# Patient Record
Sex: Female | Born: 1950 | Race: Black or African American | Hispanic: No | Marital: Married | Smoking: Never smoker
Health system: Southern US, Community
[De-identification: ages and names within clinical notes are randomized; demographics above are authoritative.]

## PROBLEM LIST (undated history)

## (undated) DIAGNOSIS — F32A Depression, unspecified: Secondary | ICD-10-CM

## (undated) DIAGNOSIS — I071 Rheumatic tricuspid insufficiency: Secondary | ICD-10-CM

## (undated) DIAGNOSIS — Z789 Other specified health status: Secondary | ICD-10-CM

## (undated) DIAGNOSIS — I10 Essential (primary) hypertension: Secondary | ICD-10-CM

## (undated) DIAGNOSIS — D649 Anemia, unspecified: Secondary | ICD-10-CM

## (undated) DIAGNOSIS — I499 Cardiac arrhythmia, unspecified: Secondary | ICD-10-CM

## (undated) DIAGNOSIS — G4730 Sleep apnea, unspecified: Secondary | ICD-10-CM

## (undated) DIAGNOSIS — M1990 Unspecified osteoarthritis, unspecified site: Secondary | ICD-10-CM

## (undated) DIAGNOSIS — K219 Gastro-esophageal reflux disease without esophagitis: Secondary | ICD-10-CM

## (undated) DIAGNOSIS — I341 Nonrheumatic mitral (valve) prolapse: Secondary | ICD-10-CM

## (undated) DIAGNOSIS — K760 Fatty (change of) liver, not elsewhere classified: Secondary | ICD-10-CM

## (undated) DIAGNOSIS — IMO0001 Reserved for inherently not codable concepts without codable children: Secondary | ICD-10-CM

## (undated) DIAGNOSIS — E119 Type 2 diabetes mellitus without complications: Secondary | ICD-10-CM

## (undated) DIAGNOSIS — F329 Major depressive disorder, single episode, unspecified: Secondary | ICD-10-CM

## (undated) DIAGNOSIS — I493 Ventricular premature depolarization: Secondary | ICD-10-CM

## (undated) DIAGNOSIS — K589 Irritable bowel syndrome without diarrhea: Secondary | ICD-10-CM

## (undated) DIAGNOSIS — R609 Edema, unspecified: Secondary | ICD-10-CM

## (undated) HISTORY — DX: Irritable bowel syndrome, unspecified: K58.9

## (undated) HISTORY — DX: Major depressive disorder, single episode, unspecified: F32.9

## (undated) HISTORY — DX: Ventricular premature depolarization: I49.3

## (undated) HISTORY — DX: Cardiac arrhythmia, unspecified: I49.9

## (undated) HISTORY — PX: TONSILLECTOMY: SUR1361

## (undated) HISTORY — DX: Gastro-esophageal reflux disease without esophagitis: K21.9

## (undated) HISTORY — DX: Other specified health status: Z78.9

## (undated) HISTORY — DX: Reserved for inherently not codable concepts without codable children: IMO0001

## (undated) HISTORY — DX: Nonrheumatic mitral (valve) prolapse: I34.1

## (undated) HISTORY — DX: Depression, unspecified: F32.A

## (undated) HISTORY — DX: Edema, unspecified: R60.9

## (undated) HISTORY — DX: Essential (primary) hypertension: I10

---

## 1983-10-18 HISTORY — PX: EXCISION / BIOPSY BREAST / NIPPLE / DUCT: SUR469

## 1983-10-18 HISTORY — PX: ABDOMINAL HYSTERECTOMY: SHX81

## 1998-12-28 ENCOUNTER — Emergency Department (HOSPITAL_COMMUNITY): Admission: EM | Admit: 1998-12-28 | Discharge: 1998-12-28 | Payer: Self-pay | Admitting: Emergency Medicine

## 1998-12-28 ENCOUNTER — Encounter: Payer: Self-pay | Admitting: Emergency Medicine

## 2000-10-17 HISTORY — PX: BILATERAL SALPINGOOPHORECTOMY: SHX1223

## 2003-02-07 ENCOUNTER — Encounter: Payer: Self-pay | Admitting: Internal Medicine

## 2003-02-08 ENCOUNTER — Inpatient Hospital Stay (HOSPITAL_COMMUNITY): Admission: EM | Admit: 2003-02-08 | Discharge: 2003-02-08 | Payer: Self-pay | Admitting: Internal Medicine

## 2003-02-14 ENCOUNTER — Encounter (HOSPITAL_COMMUNITY): Admission: RE | Admit: 2003-02-14 | Discharge: 2003-03-16 | Payer: Self-pay | Admitting: Cardiology

## 2004-12-21 ENCOUNTER — Ambulatory Visit: Payer: Self-pay | Admitting: General Surgery

## 2006-01-17 ENCOUNTER — Ambulatory Visit: Payer: Self-pay | Admitting: General Surgery

## 2006-06-16 ENCOUNTER — Ambulatory Visit: Payer: Self-pay

## 2006-10-17 DIAGNOSIS — I499 Cardiac arrhythmia, unspecified: Secondary | ICD-10-CM

## 2006-10-17 HISTORY — DX: Cardiac arrhythmia, unspecified: I49.9

## 2007-01-23 ENCOUNTER — Ambulatory Visit: Payer: Self-pay | Admitting: General Surgery

## 2007-03-11 ENCOUNTER — Inpatient Hospital Stay: Payer: Self-pay | Admitting: Internal Medicine

## 2007-03-11 ENCOUNTER — Other Ambulatory Visit: Payer: Self-pay

## 2007-03-16 ENCOUNTER — Ambulatory Visit: Payer: Self-pay | Admitting: Internal Medicine

## 2007-03-28 ENCOUNTER — Ambulatory Visit: Payer: Self-pay | Admitting: Internal Medicine

## 2007-04-09 ENCOUNTER — Ambulatory Visit: Payer: Self-pay | Admitting: Cardiovascular Disease

## 2007-05-23 ENCOUNTER — Ambulatory Visit: Payer: Self-pay | Admitting: Unknown Physician Specialty

## 2008-02-19 ENCOUNTER — Ambulatory Visit: Payer: Self-pay | Admitting: General Surgery

## 2009-03-03 ENCOUNTER — Ambulatory Visit: Payer: Self-pay | Admitting: General Surgery

## 2010-03-05 ENCOUNTER — Ambulatory Visit: Payer: Self-pay | Admitting: General Surgery

## 2010-10-17 HISTORY — PX: COLONOSCOPY: SHX174

## 2011-03-07 ENCOUNTER — Ambulatory Visit: Payer: Self-pay | Admitting: General Surgery

## 2011-09-28 LAB — HM COLONOSCOPY: HM Colonoscopy: NORMAL

## 2012-02-24 DIAGNOSIS — I493 Ventricular premature depolarization: Secondary | ICD-10-CM

## 2012-02-24 DIAGNOSIS — I10 Essential (primary) hypertension: Secondary | ICD-10-CM | POA: Insufficient documentation

## 2012-02-24 HISTORY — DX: Ventricular premature depolarization: I49.3

## 2012-03-27 ENCOUNTER — Ambulatory Visit: Payer: Self-pay | Admitting: General Surgery

## 2012-03-28 LAB — HM MAMMOGRAPHY: HM Mammogram: NORMAL

## 2013-01-25 ENCOUNTER — Ambulatory Visit (INDEPENDENT_AMBULATORY_CARE_PROVIDER_SITE_OTHER): Payer: BC Managed Care – PPO | Admitting: Internal Medicine

## 2013-01-25 ENCOUNTER — Encounter: Payer: Self-pay | Admitting: Internal Medicine

## 2013-01-25 VITALS — BP 124/80 | HR 68 | Temp 98.2°F | Ht 65.5 in | Wt 190.0 lb

## 2013-01-25 DIAGNOSIS — Z0000 Encounter for general adult medical examination without abnormal findings: Secondary | ICD-10-CM

## 2013-01-25 DIAGNOSIS — R5381 Other malaise: Secondary | ICD-10-CM

## 2013-01-25 DIAGNOSIS — Z13220 Encounter for screening for lipoid disorders: Secondary | ICD-10-CM

## 2013-01-25 DIAGNOSIS — R5383 Other fatigue: Secondary | ICD-10-CM

## 2013-01-25 DIAGNOSIS — E669 Obesity, unspecified: Secondary | ICD-10-CM

## 2013-01-25 DIAGNOSIS — Z8742 Personal history of other diseases of the female genital tract: Secondary | ICD-10-CM

## 2013-01-25 DIAGNOSIS — R221 Localized swelling, mass and lump, neck: Secondary | ICD-10-CM

## 2013-01-25 DIAGNOSIS — R609 Edema, unspecified: Secondary | ICD-10-CM

## 2013-01-25 DIAGNOSIS — K625 Hemorrhage of anus and rectum: Secondary | ICD-10-CM

## 2013-01-25 DIAGNOSIS — R220 Localized swelling, mass and lump, head: Secondary | ICD-10-CM

## 2013-01-25 LAB — POCT URINALYSIS DIPSTICK
Bilirubin, UA: NEGATIVE
Blood, UA: NEGATIVE
Glucose, UA: NEGATIVE
Ketones, UA: NEGATIVE
Leukocytes, UA: NEGATIVE
Nitrite, UA: NEGATIVE
Protein, UA: NEGATIVE
Spec Grav, UA: 1.01
Urobilinogen, UA: 0.2
pH, UA: 8

## 2013-01-25 NOTE — Patient Instructions (Addendum)
Your pelvic exam was normal  You have an internal hemorrhoid that probably bled when you passed a hard stool  Try taking a stool softener (colace 100 mg daily) to avoid hard stools/  Continue high fiber diet and at least 3 16 ounce servings on noncaffeinated fluids  Return for fasting labs.   One month follow up

## 2013-01-25 NOTE — Progress Notes (Signed)
Patient ID: Kari Lyons, female   DOB: [birth year], 62 y.o.   MRN: [id number]    Patient Active Problem List  Diagnosis  . Rectal bleeding  . Right facial swelling  . History of vaginal discharge    Subjective:  CC:   Chief Complaint  Patient presents with  . Establish Care    HPI:   Kari Lyons is a 62 y.o. female who presents as a new patient to establish primary care with the chief complaint of Multiple complaints. She is formerly patient of Y[facility] Dill and Dr. Corliss Parish .   Family history of cancer. Her Sister had ovarian ca in her 81s.  Paternal grandmother had breast cancer. Maternal grandmother had colon cancer. Brother had multiple myeloma. Has BRCA and colon CA in family.  Her other surviving sister has been genetically tested for BRCA1 and 2 and was negative. She has not been tested. Her daughter has not been tested. After her sister was diagnosed with ovarian cancer she underwent a hysterectomy in 1985 followed by a bilateral salpingo-oophorectomy oophorectomy in 2002.  these were done at [facility] by Dr. Ether Griffins .  History of bloody nipple discharge. She underwent breast biopsy on the right by Dr. Evette Cristal which was normal. Mammograms and annual breast exams are done by Dr. Evette Cristal.  Rectal pain. Patient has a history of a normal colonoscopy in December of 2004 by Dr. Mechele Collin per patient. She believes she was told she had hemorrhoids. She has been having rectal pain with bowel movements and noticed bright red blood per rectum recently after wiping. No blood mixed in the stools.   Lymph node enlargement. Patient states that she has had facial swelling on the right and enlargement and pain in her thymus and spleen areas. She cannot characterize his pain and actually states that the pain and enlargement was diagnosed through reflexology that she does on her own. She has no history of night sweats, generalized weakness,, focal muscle weakness, unintentional weight loss or shortness of  breath.  She does have a history of a root canal on the right and is overdue for dental evaluation.    Past Medical History  Diagnosis Date  . IBS (irritable bowel syndrome)   . GERD (gastroesophageal reflux disease)   . Depression   . Hypertension     Past Surgical History  Procedure Laterality Date  . Tonsilectomy, adenoidectomy, bilateral myringotomy and tubes    . Breast biopsy    . Bilateral salpingoophorectomy  2002    due to fh of ovarian ca  . Abdominal hysterectomy  1985    Family History  Problem Relation Age of Onset  . Hypertension Mother   . Arthritis Mother   . Parkinson's disease Father   . Hypertension Sister   . Heart disease Daughter   . Cancer Sister 45    ovarian ca  . Cancer Brother     multiple myeloma  . Diabetes Brother   . Cancer Maternal Grandmother     colon ca  . Cancer Paternal Grandmother     breast ca    History   Social History  . Marital Status: Married    Spouse Name: N/A    Number of Children: N/A  . Years of Education: N/A   Occupational History  . Not on file.   Social History Main Topics  . Smoking status: Never Smoker   . Smokeless tobacco: Never Used  . Alcohol Use: No  . Drug Use: No  .  Sexually Active: Not on file   Other Topics Concern  . Not on file   Social History Narrative  . No narrative on file   Allergies  Allergen Reactions  . Aloe Vera Rash    Review of Systems:   Patient denies headache, fevers, malaise, unintentional weight loss, skin rash, eye pain, sinus congestion and sinus pain, sore throat, dysphagia,  hemoptysis , cough, dyspnea, wheezing, chest pain, palpitations, orthopnea, edema, abdominal pain, nausea, melena, diarrhea, constipation, flank pain, dysuria, hematuria, urinary  Frequency, nocturia, numbness, tingling, seizures,  Focal weakness, Loss of consciousness,  Tremor, insomnia, depression, anxiety, and suicidal ideation.          Objective:  BP 124/80  Pulse 68   Temp(Src) 98.2 F (36.8 C) (Oral)  Ht 5' 5.5" (1.664 m)  Wt 190 lb (86.183 kg)  BMI 31.13 kg/m2  SpO2 97%  General Appearance:    Alert, cooperative, no distress, appears stated age  Head:    Normocephalic, without obvious abnormality, atraumatic  Eyes:    PERRL, conjunctiva/corneas clear, EOM's intact, fundi    benign, both eyes  Ears:    Normal TM's and external ear canals, both ears  Nose:   Nares normal, septum midline, mucosa normal, no drainage    or sinus tenderness  Throat:   Lips, mucosa, and tongue normal; teeth and gums normal  Neck:   Supple, symmetrical, trachea midline, no cervical adenopathy or parotid enlargement    thyroid:  no enlargement/tenderness/nodules; no carotid   bruit or JVD  Back:     Symmetric, no curvature, ROM normal, no CVA tenderness  Lungs:     Clear to auscultation bilaterally, respirations unlabored  Chest Wall:    No tenderness or deformity no masses appreciated.    Heart:    Regular rate and rhythm, S1 and S2 normal, no murmur, rub   or gallop  Rectal Exam:    No tenderness, masses, or bleeding. Internal hemorrhoids noted.   Abdomen:     Soft, non-tender, bowel sounds active all four quadrants,    no masses, no organomegaly  Extremities:   Extremities normal, atraumatic, no cyanosis or edema  Pulses:   2+ and symmetric all extremities  Skin:   Skin color, texture, turgor normal, no rashes or lesions  Lymph nodes:   Cervical, supraclavicular, and axillary nodes normal  Neurologic:   CNII-XII intact, normal strength, sensation and reflexes    throughout   Assessment and Plan:  Rectal bleeding Today's rectal exam suggested that she is bleeding from internal hemorrhoids. They were not actively bleeding today. Recommended use of stool softener since fiber supplements cause for her abdominal pain and distention which she cannot tolerate. She is up-to-date on colonoscopy..  Right facial swelling Patient description and history of facial swelling  does not support current physical exam. She has no sense of parotid enlargement, tenderness, or lymphadenopathy on the right cervical side. It exam is normal. Reassurance given.   Updated Medication List Outpatient Encounter Prescriptions as of 01/25/2013  Medication Sig Dispense Refill  . Bearberry, Uva-Ursi, (UVA URSI PO) Take 500 mg by mouth 3 (three) times daily.      Marland Kitchen Bioflavonoid Products (ESTER C PO) Take 500 mg by mouth.      . Cholecalciferol (VITAMIN D3) 2000 UNITS TABS Take 2,000 Units by mouth daily.      . Chromium 200 MCG CAPS Take by mouth.      Marland Kitchen HAWTHORN BERRIES PO Take 510 mg by mouth 3 (  three) times daily.      . magnesium gluconate (MAGONATE) 500 MG tablet Take 500 mg by mouth daily.      . Misc Natural Products (DANDELION ROOT) 520 MG CAPS Take by mouth.      . Potassium Gluconate 595 MG CAPS Take 595 mg by mouth daily.      Marland Kitchen RUTIN PO Take 450 mg by mouth 2 (two) times daily.      Marland Kitchen UNABLE TO FIND Take 480 mg by mouth daily. Marshmallow Root      . UNABLE TO FIND 400 mg. [employer]      . UNABLE TO FIND Fennel Seed      . Valerian Root EXTR by Does not apply route 3 (three) times daily.       No facility-administered encounter medications on file as of 01/25/2013.     Orders Placed This Encounter  Procedures  . HM MAMMOGRAPHY  . Lipid panel  . CBC with Differential  . Comprehensive metabolic panel  . TSH  . POCT Urinalysis Dipstick  . HM COLONOSCOPY    Return in about 4 weeks (around 02/22/2013).

## 2013-01-26 DIAGNOSIS — R220 Localized swelling, mass and lump, head: Secondary | ICD-10-CM | POA: Insufficient documentation

## 2013-01-26 DIAGNOSIS — K625 Hemorrhage of anus and rectum: Secondary | ICD-10-CM | POA: Insufficient documentation

## 2013-01-26 DIAGNOSIS — Z8742 Personal history of other diseases of the female genital tract: Secondary | ICD-10-CM | POA: Insufficient documentation

## 2013-01-26 NOTE — Assessment & Plan Note (Signed)
Patient description and history of facial swelling does not support current physical exam. She has no sense of parotid enlargement, tenderness, or lymphadenopathy on the right cervical side. It exam is normal. Reassurance given.

## 2013-01-26 NOTE — Assessment & Plan Note (Signed)
Today's rectal exam suggested that she is bleeding from internal hemorrhoids. They were not actively bleeding today. Recommended use of stool softener since fiber supplements cause for her abdominal pain and distention which she cannot tolerate. She is up-to-date on colonoscopy.Marland Kitchen

## 2013-02-11 ENCOUNTER — Ambulatory Visit (INDEPENDENT_AMBULATORY_CARE_PROVIDER_SITE_OTHER): Payer: BC Managed Care – PPO | Admitting: General Surgery

## 2013-02-11 ENCOUNTER — Encounter: Payer: Self-pay | Admitting: General Surgery

## 2013-02-11 VITALS — BP 160/78 | HR 68 | Resp 14 | Ht 64.75 in | Wt 191.0 lb

## 2013-02-11 DIAGNOSIS — N6459 Other signs and symptoms in breast: Secondary | ICD-10-CM

## 2013-02-11 DIAGNOSIS — D242 Benign neoplasm of left breast: Secondary | ICD-10-CM | POA: Insufficient documentation

## 2013-02-11 NOTE — Progress Notes (Signed)
Patient ID: Kari Lyons, female   DOB: 1951-02-11, 62 y.o.   MRN: [id number]  Chief Complaint  Patient presents with  . Other    Left breast nipple bleeding    HPI Kari Lyons is a 62 y.o. female who presents for an evaluation of left breast nipple bleeding. The patient states she noticed discharge on her night gown 3-4 days ago but just thought that she had spilled tea or something down the front of her gown. Last night she noticed the blood again. She then checked her breast by squeezing and realized it was blood coming from the left nipple. She states the blood is dark. She has a history of right breast nipple discharge in the past with bright red blood. She had exploratory surgery that was pre-cancerous in 1985. She checks her breast occasionally and gets yearly mammograms. She has a family history of breast cancer on her paternal side. She is due for her next mammogram in June 2014.   HPI  Past Medical History  Diagnosis Date  . IBS (irritable bowel syndrome)   . GERD (gastroesophageal reflux disease)   . Depression   . Hypertension     Past Surgical History  Procedure Laterality Date  . Tonsilectomy, adenoidectomy, bilateral myringotomy and tubes    . Breast biopsy    . Bilateral salpingoophorectomy  2002    due to fh of ovarian ca  . Abdominal hysterectomy  1985    Family History  Problem Relation Age of Onset  . Hypertension Mother   . Arthritis Mother   . Parkinson's disease Father   . Hypertension Sister   . Heart disease Daughter   . Cancer Sister 17    ovarian ca  . Cancer Brother     multiple myeloma  . Diabetes Brother   . Cancer Maternal Grandmother     colon ca  . Cancer Paternal Grandmother     breast ca    Social History History  Substance Use Topics  . Smoking status: Never Smoker   . Smokeless tobacco: Never Used  . Alcohol Use: No    Allergies  Allergen Reactions  . Aloe Vera Rash    Current Outpatient Prescriptions  Medication  Sig Dispense Refill  . Bearberry, Uva-Ursi, (UVA URSI PO) Take 500 mg by mouth 3 (three) times daily.      . Cholecalciferol (VITAMIN D3) 2000 UNITS TABS Take 2,000 Units by mouth daily.      . Chromium 200 MCG TABS Take 1 tablet by mouth 2 (two) times daily.      Marland Kitchen CINNAMON PO Take 1,000 tablets by mouth 2 (two) times daily.      . Fenugreek 610 MG CAPS Take 1 capsule by mouth daily.      . Ginger, Zingiber officinalis, (GINGER PO) 1 teaspoon by mouth daily.      Marland Kitchen HAWTHORN BERRIES PO Take 150 mg by mouth 3 (three) times daily.       Marland Kitchen OVER THE COUNTER MEDICATION Take 1 capsule by mouth daily. THYROID COMPLEX      . OVER THE COUNTER MEDICATION Take 2 capsules by mouth daily. CARDIO ADVANCED      . OVER THE COUNTER MEDICATION Take 2 capsules by mouth at bedtime. Valarian Root      . OVER THE COUNTER MEDICATION Fennel 1 teaspoon by mouth daily.      . Potassium 99 MG TABS Take 1 tablet by mouth daily.  No current facility-administered medications for this visit.    Review of Systems Review of Systems  Constitutional: Negative.   Respiratory: Negative.   Cardiovascular: Negative.     Blood pressure 160/78, pulse 68, resp. rate 14, height 5' 4.75" (1.645 m), weight 191 lb (86.637 kg).  Physical Exam Physical Exam  Constitutional: She appears well-developed and well-nourished.  Neck: Trachea normal. No mass and no thyromegaly present.  Pulmonary/Chest: Right breast exhibits no inverted nipple, no mass, no nipple discharge, no skin change and no tenderness. Left breast exhibits nipple discharge. Left breast exhibits no inverted nipple, no mass, no skin change and no tenderness.  Left nipple discharge blood tinged. Seems to be coming from upper outer quadrant subareolar tissue.  Lymphadenopathy:    She has no cervical adenopathy.    She has no axillary adenopathy.    Data Reviewed None  Assessment    Left nipple discharge     Plan    Patient to proceed with bilateral  diagnostic mammogram. This patient is to return to the office after mammogram for follow up and office ultrasound.  Patient has been scheduled for her mammogram on 02-12-13 at 8 am. She is aware of date, time, and instructions.        SANKAR,SEEPLAPUTHUR G 02/11/2013, 7:41 PM

## 2013-02-11 NOTE — Patient Instructions (Addendum)
Patient to proceed with bilateral diagnostic mammogram. This patient is to return to the office after mammogram for follow up and office ultrasound.  Patient has been scheduled for her mammogram on 02-12-13 at 8 am. She is aware of date, time, and instructions.

## 2013-02-12 ENCOUNTER — Ambulatory Visit: Payer: Self-pay | Admitting: General Surgery

## 2013-02-19 ENCOUNTER — Ambulatory Visit (INDEPENDENT_AMBULATORY_CARE_PROVIDER_SITE_OTHER): Payer: BC Managed Care – PPO | Admitting: General Surgery

## 2013-02-19 ENCOUNTER — Other Ambulatory Visit: Payer: Self-pay

## 2013-02-19 ENCOUNTER — Encounter: Payer: Self-pay | Admitting: General Surgery

## 2013-02-19 VITALS — BP 140/87 | HR 74 | Resp 14 | Ht 64.75 in | Wt 187.0 lb

## 2013-02-19 DIAGNOSIS — N6459 Other signs and symptoms in breast: Secondary | ICD-10-CM

## 2013-02-19 DIAGNOSIS — N63 Unspecified lump in unspecified breast: Secondary | ICD-10-CM

## 2013-02-19 NOTE — Patient Instructions (Addendum)
Continue self breast exams. Call office for any new breast issues or concerns.  Plan left breast excision of nodule at [facility]. This has been scheduled for 02-26-13.

## 2013-02-19 NOTE — Progress Notes (Signed)
Patient ID: Kari Lyons, female   DOB: February 04, 1951, 62 y.o.   MRN: [id number]  Chief Complaint  Patient presents with  . Follow-up    mammo    HPI Kari Lyons is a 62 y.o. female here today for her follow up mammogram done at [facility]. She is here today for planned ultrasound.  Mammogram was normal. Patient complains of left breast pain since mammogram. She was previously here on 02/11/13 for left breast nipple discharge-bloody. The patient states the discharge is still present but hasn't gotten worse nor better since last visit.  HPI  Past Medical History  Diagnosis Date  . IBS (irritable bowel syndrome)   . GERD (gastroesophageal reflux disease)   . Depression   . Hypertension   . Edema   . Arrhythmia 2008    Past Surgical History  Procedure Laterality Date  . Tonsilectomy, adenoidectomy, bilateral myringotomy and tubes    . Breast biopsy    . Bilateral salpingoophorectomy  2002    due to fh of ovarian ca  . Abdominal hysterectomy  1985  . Colonoscopy  2012    Family History  Problem Relation Age of Onset  . Hypertension Mother   . Arthritis Mother   . Parkinson's disease Father   . Hypertension Sister   . Heart disease Daughter   . Cancer Sister 35    ovarian ca  . Cancer Brother     multiple myeloma  . Diabetes Brother   . Cancer Maternal Grandmother     colon ca  . Cancer Paternal Grandmother     breast ca    Social History History  Substance Use Topics  . Smoking status: Never Smoker   . Smokeless tobacco: Never Used  . Alcohol Use: No    Allergies  Allergen Reactions  . Aloe Rash  . Aloe Vera Rash    Current Outpatient Prescriptions  Medication Sig Dispense Refill  . Bearberry, Uva-Ursi, (UVA URSI PO) Take 500 mg by mouth 3 (three) times daily.      . Cholecalciferol (VITAMIN D3) 2000 UNITS TABS Take 2,000 Units by mouth daily.      . Chromium 200 MCG TABS Take 1 tablet by mouth 2 (two) times daily.      Marland Kitchen CINNAMON PO Take 1,000 tablets by  mouth 2 (two) times daily.      . Fenugreek 610 MG CAPS Take 1 capsule by mouth daily.      . Ginger, Zingiber officinalis, (GINGER PO) 1 teaspoon by mouth daily.      Marland Kitchen HAWTHORN BERRIES PO Take 150 mg by mouth 3 (three) times daily.       Marland Kitchen OVER THE COUNTER MEDICATION Take 1 capsule by mouth daily. THYROID COMPLEX      . OVER THE COUNTER MEDICATION Take 2 capsules by mouth daily. CARDIO ADVANCED      . OVER THE COUNTER MEDICATION Take 2 capsules by mouth at bedtime. Valarian Root      . OVER THE COUNTER MEDICATION Fennel 1 teaspoon by mouth daily.      . Potassium 99 MG TABS Take 1 tablet by mouth daily.       No current facility-administered medications for this visit.    Review of Systems Review of Systems  Constitutional: Negative.   Respiratory: Negative.   Cardiovascular: Negative.     Blood pressure 140/87, pulse 74, resp. rate 14, height 5' 4.75" (1.645 m), weight 187 lb (84.823 kg).  Physical Exam Physical Exam  Constitutional: She is oriented to person, place, and time. She appears well-developed and well-nourished.  Neck: No mass and no thyromegaly present.  Cardiovascular: Normal rate, regular rhythm and normal heart sounds.   Pulmonary/Chest: Effort normal and breath sounds normal. Left breast exhibits nipple discharge. Left breast exhibits no inverted nipple, no mass, no skin change and no tenderness.  3 o'clock at areolar margin a 6 mm irregular shaped nodule with minimal shading noted on US  Lymphadenopathy:    She has no cervical adenopathy.    She has no axillary adenopathy.  Neurological: She is alert and oriented to person, place, and time.  Skin: Skin is warm and dry.    Data Reviewed Patient her today for planned ultrasound.  Mammogram was normal.  Assessment    [place] of left breast areolar region shows a 6 mm irregular hypoechoic area, mild shadowing. Located 3 o'cl near areolar margin. No large ducts seen.    Plan    excision of left breast mass and  areolar duct upper outer quadrant    Plan left breast excision of nodule at [facility]. This has been scheduled for 02-26-13.   Please note: patient does not take blood products.   Mills Mitton G 02/20/2013, 5:25 AM

## 2013-02-20 ENCOUNTER — Encounter: Payer: Self-pay | Admitting: General Surgery

## 2013-02-20 ENCOUNTER — Other Ambulatory Visit: Payer: Self-pay | Admitting: General Surgery

## 2013-02-20 DIAGNOSIS — N63 Unspecified lump in unspecified breast: Secondary | ICD-10-CM

## 2013-02-20 DIAGNOSIS — D242 Benign neoplasm of left breast: Secondary | ICD-10-CM | POA: Insufficient documentation

## 2013-02-20 DIAGNOSIS — N6452 Nipple discharge: Secondary | ICD-10-CM

## 2013-02-22 ENCOUNTER — Ambulatory Visit: Payer: Self-pay | Admitting: General Surgery

## 2013-02-22 ENCOUNTER — Other Ambulatory Visit (INDEPENDENT_AMBULATORY_CARE_PROVIDER_SITE_OTHER): Payer: BC Managed Care – PPO

## 2013-02-22 ENCOUNTER — Telehealth: Payer: Self-pay | Admitting: Internal Medicine

## 2013-02-22 ENCOUNTER — Telehealth: Payer: Self-pay | Admitting: *Deleted

## 2013-02-22 DIAGNOSIS — Z13220 Encounter for screening for lipoid disorders: Secondary | ICD-10-CM

## 2013-02-22 DIAGNOSIS — E669 Obesity, unspecified: Secondary | ICD-10-CM

## 2013-02-22 DIAGNOSIS — R5383 Other fatigue: Secondary | ICD-10-CM

## 2013-02-22 DIAGNOSIS — R5381 Other malaise: Secondary | ICD-10-CM

## 2013-02-22 LAB — COMPREHENSIVE METABOLIC PANEL
ALT: 32 U/L (ref 0–35)
AST: 34 U/L (ref 0–37)
Albumin: 4.2 g/dL (ref 3.5–5.2)
Alkaline Phosphatase: 56 U/L (ref 39–117)
BUN: 6 mg/dL (ref 6–23)
CO2: 27 mEq/L (ref 19–32)
Calcium: 9.3 mg/dL (ref 8.4–10.5)
Chloride: 105 mEq/L (ref 96–112)
Creatinine, Ser: 0.8 mg/dL (ref 0.4–1.2)
GFR: 88.13 mL/min (ref >60.00)
Glucose, Bld: 92 mg/dL (ref 70–99)
Potassium: 4.1 mEq/L (ref 3.5–5.1)
Sodium: 140 mEq/L (ref 135–145)
Total Bilirubin: 0.9 mg/dL (ref 0.3–1.2)
Total Protein: 6.8 g/dL (ref 6.0–8.3)

## 2013-02-22 LAB — CBC WITH DIFFERENTIAL/PLATELET
Basophils Absolute: 0 10*3/uL (ref 0.0–0.1)
Basophils Relative: 0.1 % (ref 0.0–3.0)
Eosinophils Absolute: 0.1 10*3/uL (ref 0.0–0.7)
Eosinophils Relative: 1.8 % (ref 0.0–5.0)
HCT: 38.2 % (ref 36.0–46.0)
Hemoglobin: 12.8 g/dL (ref 12.0–15.0)
Lymphocytes Relative: 55.9 % — ABNORMAL HIGH (ref 12.0–46.0)
Lymphs Abs: 2.1 10*3/uL (ref 0.7–4.0)
MCHC: 33.6 g/dL (ref 30.0–36.0)
MCV: 86.6 fl (ref 78.0–100.0)
Monocytes Absolute: 0.3 10*3/uL (ref 0.1–1.0)
Monocytes Relative: 8.1 % (ref 3.0–12.0)
Neutro Abs: 1.3 10*3/uL — ABNORMAL LOW (ref 1.4–7.7)
Neutrophils Relative %: 34.1 % — ABNORMAL LOW (ref 43.0–77.0)
Platelets: 188 10*3/uL (ref 150.0–400.0)
RBC: 4.42 Mil/uL (ref 3.87–5.11)
RDW: 13.9 % (ref 11.5–14.6)
WBC: 3.7 10*3/uL — ABNORMAL LOW (ref 4.5–10.5)

## 2013-02-22 LAB — LIPID PANEL
Cholesterol: 105 mg/dL (ref 0–200)
HDL: 39.5 mg/dL (ref >39.00)
LDL Cholesterol: 54 mg/dL (ref 0–99)
Total CHOL/HDL Ratio: 3
Triglycerides: 58 mg/dL (ref 0.0–149.0)
VLDL: 11.6 mg/dL (ref 0.0–40.0)

## 2013-02-22 LAB — TSH: TSH: 1.09 u[IU]/mL (ref 0.35–5.50)

## 2013-02-22 NOTE — Telephone Encounter (Signed)
Will mail labs to patient to address

## 2013-02-22 NOTE — Telephone Encounter (Signed)
Pt states she is returning a call  

## 2013-02-22 NOTE — Telephone Encounter (Signed)
Pt came in for labs today and said if she can get today's 05.09.2014 labs mailed to her

## 2013-02-26 ENCOUNTER — Ambulatory Visit: Payer: Self-pay | Admitting: General Surgery

## 2013-02-26 DIAGNOSIS — N6459 Other signs and symptoms in breast: Secondary | ICD-10-CM

## 2013-02-26 HISTORY — PX: EXCISION / BIOPSY BREAST / NIPPLE / DUCT: SUR469

## 2013-02-26 HISTORY — PX: BREAST SURGERY: SHX581

## 2013-02-27 ENCOUNTER — Encounter: Payer: Self-pay | Admitting: General Surgery

## 2013-02-28 LAB — PATHOLOGY REPORT

## 2013-03-05 ENCOUNTER — Ambulatory Visit (INDEPENDENT_AMBULATORY_CARE_PROVIDER_SITE_OTHER): Payer: BC Managed Care – PPO | Admitting: Internal Medicine

## 2013-03-05 ENCOUNTER — Encounter: Payer: Self-pay | Admitting: Internal Medicine

## 2013-03-05 ENCOUNTER — Ambulatory Visit (INDEPENDENT_AMBULATORY_CARE_PROVIDER_SITE_OTHER): Payer: BC Managed Care – PPO | Admitting: General Surgery

## 2013-03-05 ENCOUNTER — Encounter: Payer: Self-pay | Admitting: General Surgery

## 2013-03-05 VITALS — BP 124/70 | HR 72 | Resp 14 | Ht 64.0 in | Wt 183.0 lb

## 2013-03-05 VITALS — BP 118/78 | HR 77 | Temp 98.4°F | Resp 16 | Wt 183.5 lb

## 2013-03-05 DIAGNOSIS — D709 Neutropenia, unspecified: Secondary | ICD-10-CM

## 2013-03-05 DIAGNOSIS — N6452 Nipple discharge: Secondary | ICD-10-CM

## 2013-03-05 DIAGNOSIS — N63 Unspecified lump in unspecified breast: Secondary | ICD-10-CM

## 2013-03-05 DIAGNOSIS — N6459 Other signs and symptoms in breast: Secondary | ICD-10-CM

## 2013-03-05 DIAGNOSIS — E669 Obesity, unspecified: Secondary | ICD-10-CM

## 2013-03-05 NOTE — Patient Instructions (Addendum)
Try the following tortillas as a replacement for bread to get more fiber (you need 25 to 35 fibers)    [place]  Low carb tortilla Buena vida small tortillas   Good job on the weight loss!1   Goal is 11 more lbs for wt of 172 (10% of original body weight ) to get BMI below 30.    We will repeat your CBC (nonfasting ) in a month or so .   Return in 6 months

## 2013-03-05 NOTE — Progress Notes (Signed)
Patient ID: Kari Lyons, female   DOB: May 16, 1951, 62 y.o.   MRN: [id number]  Chief Complaint  Patient presents with  . Routine Post Op    HPI Kari Lyons is a 62 y.o. female.  Patient here today for postop visit left breast excision subareolar duct and mass done 02-26-13. States she is getting along well no pain just "soreness".  Using colace to avoid constipation. HPI  Past Medical History  Diagnosis Date  . IBS (irritable bowel syndrome)   . GERD (gastroesophageal reflux disease)   . Depression   . Hypertension   . Edema   . Arrhythmia 2008    Past Surgical History  Procedure Laterality Date  . Tonsilectomy, adenoidectomy, bilateral myringotomy and tubes    . Breast biopsy    . Bilateral salpingoophorectomy  2002    due to fh of ovarian ca  . Abdominal hysterectomy  1985  . Colonoscopy  2012  . Breast surgery Left 02-26-13    subareloar duct excision and mass    Family History  Problem Relation Age of Onset  . Hypertension Mother   . Arthritis Mother   . Parkinson's disease Father   . Hypertension Sister   . Heart disease Daughter   . Cancer Sister 77    ovarian ca  . Cancer Brother     multiple myeloma  . Diabetes Brother   . Cancer Maternal Grandmother     colon ca  . Cancer Paternal Grandmother     breast ca    Social History History  Substance Use Topics  . Smoking status: Never Smoker   . Smokeless tobacco: Never Used  . Alcohol Use: No    Allergies  Allergen Reactions  . Aloe Rash  . Aloe Vera Rash    Current Outpatient Prescriptions  Medication Sig Dispense Refill  . Bearberry, Uva-Ursi, (UVA URSI PO) Take 500 mg by mouth 3 (three) times daily.      . Cholecalciferol (VITAMIN D3) 2000 UNITS TABS Take 2,000 Units by mouth daily.      . Chromium 200 MCG TABS Take 1 tablet by mouth 2 (two) times daily.      Marland Kitchen CINNAMON PO Take 1,000 tablets by mouth 2 (two) times daily.      Marland Kitchen docusate sodium (COLACE) 100 MG capsule Take 100 mg by mouth  3 (three) times daily as needed for constipation.      . Fenugreek 610 MG CAPS Take 1 capsule by mouth daily.      . Ginger, Zingiber officinalis, (GINGER PO) 1 teaspoon by mouth daily.      Marland Kitchen HAWTHORN BERRIES PO Take 150 mg by mouth 3 (three) times daily.       Marland Kitchen OVER THE COUNTER MEDICATION Take 1 capsule by mouth daily. THYROID COMPLEX      . OVER THE COUNTER MEDICATION Take 2 capsules by mouth daily. CARDIO ADVANCED      . OVER THE COUNTER MEDICATION Take 2 capsules by mouth at bedtime. Valarian Root      . OVER THE COUNTER MEDICATION Fennel 1 teaspoon by mouth daily.      . Potassium 99 MG TABS Take 1 tablet by mouth daily.       No current facility-administered medications for this visit.    Review of Systems Review of Systems  Constitutional: Negative.   Cardiovascular: Negative.     Blood pressure 124/70, pulse 72, resp. rate 14, height 5\' 4"  (1.626 m), weight 183 lb (83.008  kg).  Physical Exam Physical Exam  Constitutional: She is oriented to person, place, and time. She appears well-developed and well-nourished.  Neurological: She is alert and oriented to person, place, and time.  Skin: Skin is warm and dry.  Area healing well to left breast incision.  Data Reviewed pathology showed intraductal papilloma  Assessment    Stable post op     Plan    Recheck in 6 weeks        SANKAR,SEEPLAPUTHUR G 03/05/2013, 1:08 PM

## 2013-03-05 NOTE — Progress Notes (Signed)
Patient ID: Kari Lyons, female   DOB: 1951/01/12, 62 y.o.   MRN: [id number]  Patient Active Problem List   Diagnosis Date Noted  . Neutropenia, unspecified 03/06/2013  . Obesity, unspecified 03/06/2013  . Intraductal papilloma of left breast 02/20/2013  . Other sign and symptom in breast 02/11/2013  . Rectal bleeding 01/26/2013  . Right facial swelling 01/26/2013  . History of vaginal discharge 01/26/2013    Subjective:  CC:   Chief Complaint  Patient presents with  . Follow-up    1 month follow up    HPI:   Kari Lyons a 62 y.o. female who presents for one month follow up on chronic issues including breast mass , newly diagnosed neutropenia and obesity with recent screening for metabolic disorders.    She underwent a breast biopsy last week for bloody nipple discharge and is anxious to receive the results from Dr. Evette Cristal this afternoon. There is a strong family history of cancer. Her Sister had ovarian ca in her 75s.  Paternal grandmother had breast cancer.  Her other surviving sister has been genetically tested for BRCA1 and 2 and was negative. She has not been tested. Her daughter has not been tested. After her sister was diagnosed with ovarian cancer she underwent a hysterectomy in 1985 followed by a bilateral salpingo-oophorectomy oophorectomy in 2002.    She has no known history of neutropenia, and no history of recurrent infections or use of medications known to cause this.  Brother died of multiple myeloma.,   She has been intentionally losing weight by modifying her diet and exercising.     Past Medical History  Diagnosis Date  . IBS (irritable bowel syndrome)   . GERD (gastroesophageal reflux disease)   . Depression   . Hypertension   . Edema   . Arrhythmia 2008    Past Surgical History  Procedure Laterality Date  . Tonsilectomy, adenoidectomy, bilateral myringotomy and tubes    . Breast biopsy    . Bilateral salpingoophorectomy  2002    due to fh of  ovarian ca  . Abdominal hysterectomy  1985  . Colonoscopy  2012  . Breast surgery Left 02-26-13    subareloar duct excision and mass    The following portions of the patient's history were reviewed and updated as appropriate: Allergies, current medications, and problem list.   Review of Systems:   Patient denies headache, fevers, malaise, unintentional weight loss, skin rash, eye pain, sinus congestion and sinus pain, sore throat, dysphagia,  hemoptysis , cough, dyspnea, wheezing, chest pain, palpitations, orthopnea, edema, abdominal pain, nausea, melena, diarrhea, constipation, flank pain, dysuria, hematuria, urinary  Frequency, nocturia, numbness, tingling, seizures,  Focal weakness, Loss of consciousness,  Tremor, insomnia, depression, anxiety, and suicidal ideation.     History   Social History  . Marital Status: Married    Spouse Name: N/A    Number of Children: N/A  . Years of Education: N/A   Occupational History  . Not on file.   Social History Main Topics  . Smoking status: Never Smoker   . Smokeless tobacco: Never Used  . Alcohol Use: No  . Drug Use: No  . Sexually Active: Not on file   Other Topics Concern  . Not on file   Social History Narrative  . No narrative on file    Objective:  BP 118/78  Pulse 77  Temp(Src) 98.4 F (36.9 C) (Oral)  Resp 16  Wt 183 lb 8 oz (83.235 kg)  BMI 30.76 kg/m2  SpO2 99%  General appearance: alert, cooperative and appears stated age Ears: normal TM's and external ear canals both ears Throat: lips, mucosa, and tongue normal; teeth and gums normal Neck: no adenopathy, no carotid bruit, supple, symmetrical, trachea midline and thyroid not enlarged, symmetric, no tenderness/mass/nodules Back: symmetric, no curvature. ROM normal. No CVA tenderness. Lungs: clear to auscultation bilaterally Heart: regular rate and rhythm, S1, S2 normal, no murmur, click, rub or gallop Abdomen: soft, non-tender; bowel sounds normal; no  masses,  no organomegaly Pulses: 2+ and symmetric Skin: Skin color, texture, turgor normal. No rashes or lesions Lymph nodes: Cervical, supraclavicular, and axillary nodes normal.  Assessment and Plan:  Neutropenia, unspecified Likely a normal variant, but given her strong FH of CA (brother died of multiple myeloma will recheck in one month ,  Intraductal papilloma of left breast Intraductal papilloma by recent excisional biopsy.  Patient will follow up with Dr. Evette Cristal in 6 weeks.   Obesity, unspecified Screenings for metabolic diseases were normal.  She has lost 8 lbs in less than one month.  Encouragement given to continue until BMI is 29 or lower. Follow up in 3 months    Updated Medication List Outpatient Encounter Prescriptions as of 03/05/2013  Medication Sig Dispense Refill  . Bearberry, Uva-Ursi, (UVA URSI PO) Take 500 mg by mouth 3 (three) times daily.      . Cholecalciferol (VITAMIN D3) 2000 UNITS TABS Take 2,000 Units by mouth daily.      . Chromium 200 MCG TABS Take 1 tablet by mouth 2 (two) times daily.      Marland Kitchen CINNAMON PO Take 1,000 tablets by mouth 2 (two) times daily.      Marland Kitchen docusate sodium (COLACE) 100 MG capsule Take 100 mg by mouth 3 (three) times daily as needed for constipation.      . Fenugreek 610 MG CAPS Take 1 capsule by mouth daily.      . Ginger, Zingiber officinalis, (GINGER PO) 1 teaspoon by mouth daily.      Marland Kitchen HAWTHORN BERRIES PO Take 150 mg by mouth 3 (three) times daily.       Marland Kitchen OVER THE COUNTER MEDICATION Take 2 capsules by mouth at bedtime. Valarian Root      . OVER THE COUNTER MEDICATION Fennel 1 teaspoon by mouth daily.      . Potassium 99 MG TABS Take 1 tablet by mouth daily.      Marland Kitchen OVER THE COUNTER MEDICATION Take 1 capsule by mouth daily. THYROID COMPLEX      . OVER THE COUNTER MEDICATION Take 2 capsules by mouth daily. CARDIO ADVANCED       No facility-administered encounter medications on file as of 03/05/2013.     Orders Placed This  Encounter  Procedures  . CBC with Differential  . Vitamin B12  . Folate RBC    Return in about 6 months (around 09/05/2013).

## 2013-03-05 NOTE — Patient Instructions (Addendum)
Return to activities as normal Mail pathology report to patient when available.

## 2013-03-06 ENCOUNTER — Encounter: Payer: Self-pay | Admitting: General Surgery

## 2013-03-06 ENCOUNTER — Encounter: Payer: Self-pay | Admitting: Internal Medicine

## 2013-03-06 DIAGNOSIS — D709 Neutropenia, unspecified: Secondary | ICD-10-CM | POA: Insufficient documentation

## 2013-03-06 DIAGNOSIS — E669 Obesity, unspecified: Secondary | ICD-10-CM | POA: Insufficient documentation

## 2013-03-06 NOTE — Assessment & Plan Note (Signed)
Likely a normal variant, but given her strong FH of CA (brother died of multiple myeloma will recheck in one month ,

## 2013-03-06 NOTE — Assessment & Plan Note (Signed)
Screenings for metabolic diseases were normal.  She has lost 8 lbs in less than one month.  Encouragement given to continue until BMI is 29 or lower. Follow up in 3 months

## 2013-03-06 NOTE — Assessment & Plan Note (Signed)
Intraductal papilloma by recent excisional biopsy.  Patient will follow up with Dr. Evette Cristal in 6 weeks.

## 2013-04-16 ENCOUNTER — Ambulatory Visit (INDEPENDENT_AMBULATORY_CARE_PROVIDER_SITE_OTHER): Payer: BC Managed Care – PPO | Admitting: General Surgery

## 2013-04-16 ENCOUNTER — Encounter: Payer: Self-pay | Admitting: General Surgery

## 2013-04-16 VITALS — BP 150/80 | HR 74 | Resp 12 | Ht 64.0 in | Wt 188.0 lb

## 2013-04-16 DIAGNOSIS — D369 Benign neoplasm, unspecified site: Secondary | ICD-10-CM | POA: Insufficient documentation

## 2013-04-16 DIAGNOSIS — D249 Benign neoplasm of unspecified breast: Secondary | ICD-10-CM

## 2013-04-16 DIAGNOSIS — D242 Benign neoplasm of left breast: Secondary | ICD-10-CM

## 2013-04-16 NOTE — Patient Instructions (Addendum)
Patient to return in April 2015.  

## 2013-04-16 NOTE — Progress Notes (Signed)
Patient ID: Kari Lyons, female   DOB: Sep 30, 1951, 62 y.o.   MRN: [id number]  Chief Complaint  Patient presents with  . Follow-up    HPI Kari Lyons is a 62 y.o. female here today following up from an left breast subareolar duct excision done on 02/26/13. Path -intraductal papilloma. Pt has head no further drainage from left nipple. HPI  Past Medical History  Diagnosis Date  . IBS (irritable bowel syndrome)   . GERD (gastroesophageal reflux disease)   . Depression   . Hypertension   . Edema   . Arrhythmia 2008    Past Surgical History  Procedure Laterality Date  . Tonsilectomy, adenoidectomy, bilateral myringotomy and tubes    . Breast biopsy    . Bilateral salpingoophorectomy  2002    due to fh of ovarian ca  . Abdominal hysterectomy  1985  . Colonoscopy  2012  . Breast surgery Left 02-26-13    subareloar duct excision and mass    Family History  Problem Relation Age of Onset  . Hypertension Mother   . Arthritis Mother   . Parkinson's disease Father   . Hypertension Sister   . Heart disease Daughter   . Cancer Sister 59    ovarian ca  . Cancer Brother     multiple myeloma  . Diabetes Brother   . Cancer Maternal Grandmother     colon ca  . Cancer Paternal Grandmother     breast ca    Social History History  Substance Use Topics  . Smoking status: Never Smoker   . Smokeless tobacco: Never Used  . Alcohol Use: No    Allergies  Allergen Reactions  . Aloe Rash  . Aloe Vera Rash    Current Outpatient Prescriptions  Medication Sig Dispense Refill  . Bearberry, Uva-Ursi, (UVA URSI PO) Take 500 mg by mouth 3 (three) times daily.      . Cholecalciferol (VITAMIN D3) 2000 UNITS TABS Take 2,000 Units by mouth daily.      . Chromium 200 MCG TABS Take 1 tablet by mouth 2 (two) times daily.      Marland Kitchen CINNAMON PO Take 1,000 tablets by mouth 2 (two) times daily.      Marland Kitchen docusate sodium (COLACE) 100 MG capsule Take 100 mg by mouth 3 (three) times daily as needed  for constipation.      . Fenugreek 610 MG CAPS Take 1 capsule by mouth daily.      . Ginger, Zingiber officinalis, (GINGER PO) 1 teaspoon by mouth daily.      Marland Kitchen HAWTHORN BERRIES PO Take 150 mg by mouth 3 (three) times daily.       Marland Kitchen OVER THE COUNTER MEDICATION Take 1 capsule by mouth daily. THYROID COMPLEX      . OVER THE COUNTER MEDICATION Take 2 capsules by mouth daily. CARDIO ADVANCED      . OVER THE COUNTER MEDICATION Take 2 capsules by mouth at bedtime. Valarian Root      . OVER THE COUNTER MEDICATION Fennel 1 teaspoon by mouth daily.      . Potassium 99 MG TABS Take 1 tablet by mouth daily.       No current facility-administered medications for this visit.    Review of Systems Review of Systems  Constitutional: Negative.   Respiratory: Negative.   Cardiovascular: Negative.     Blood pressure 150/80, pulse 74, resp. rate 12, height 5\' 4"  (1.626 m), weight 188 lb (85.276 kg).  Physical Exam  Physical Exam  Constitutional: She is oriented to person, place, and time. She appears well-developed and well-nourished.  Pulmonary/Chest: Left breast exhibits no inverted nipple, no mass, no nipple discharge, no skin change and no tenderness.  Left breast subareolar duct excision  Is well healed.  Lymphadenopathy:    She has no cervical adenopathy.    She has no axillary adenopathy.  Right axiliary skin lesion  present . 2-3 mm, pinkis, similar to others on her back.  Neurological: She is alert and oriented to person, place, and time.  Skin: Skin is warm and dry.    Data Reviewed None    Assessment    Intraductal papilloma     Plan    Patient to return ion April 2014 with bil screening mammogram.       Gerlene Burdock G 04/16/2013, 2:43 PM

## 2013-06-25 ENCOUNTER — Other Ambulatory Visit (INDEPENDENT_AMBULATORY_CARE_PROVIDER_SITE_OTHER): Payer: BC Managed Care – PPO

## 2013-06-25 DIAGNOSIS — D709 Neutropenia, unspecified: Secondary | ICD-10-CM

## 2013-06-25 LAB — VITAMIN B12: Vitamin B-12: 336 pg/mL (ref 211–911)

## 2013-06-26 LAB — FOLATE RBC: RBC Folate: 480 ng/mL (ref >=366)

## 2013-06-27 ENCOUNTER — Encounter: Payer: Self-pay | Admitting: *Deleted

## 2013-09-26 ENCOUNTER — Telehealth: Payer: Self-pay | Admitting: Internal Medicine

## 2013-09-26 DIAGNOSIS — R079 Chest pain, unspecified: Secondary | ICD-10-CM

## 2013-09-26 NOTE — Telephone Encounter (Signed)
Yes she should be seen next week.   . We will need to start moving some CPEs to accomodate urgents.  Plain x ray ordered , have her get them done at [facility] before her visit.

## 2013-09-26 NOTE — Telephone Encounter (Signed)
Spoke with pt, has bilateral rib cage pain, underneath breasts and radiates to back. Right side is worse than the left. Pain is intermittent, not bad enough for pt to take any pain medication. Pain on right has been going on x 2-3 weeks. Is now also having the pain radiating to bilateral shoulders, intermittent as well. Denies any injury, muscle strain. Denies fever, chest pain, shortness of breath, weakness. Appt has been scheduled 12/29. Do you need to see her sooner?

## 2013-09-26 NOTE — Telephone Encounter (Signed)
Patient Information:  Caller Name: Karee  Phone: [phone]  Patient: Kari Lyons, Kari Lyons  Gender: Female  DOB: Oct 26, 1950  Age: 62 Years  PCP: Duncan Dull (Adults only)  Office Follow Up:  Does the office need to follow up with this patient?: Yes  Instructions For The Office: Please contact patient to give further instructions.  RN Note:  Made an appointment for 10/14/13. Please contact the patient if you feel she needs an appointment prior to this time.  Symptoms  Reason For Call & Symptoms: Pain around the ribcage on the right side which radiates into her shoulders. Denies shortness of breath, dizziness at this time.  Reviewed Health History In EMR: Yes  Reviewed Medications In EMR: Yes  Reviewed Allergies In EMR: Yes  Reviewed Surgeries / Procedures: Yes  Date of Onset of Symptoms: 09/12/2013  Treatments Tried: Teacher, early years/pre, herbal remedies  Treatments Tried Worked: No  Guideline(s) Used:  No Protocol Available - Sick Adult  Disposition Per Guideline:   Discuss with PCP and Callback by Nurse Today  Reason For Disposition Reached:   Nursing judgment  Advice Given:  N/A  Patient Will Follow Care Advice:  YES

## 2013-09-27 ENCOUNTER — Ambulatory Visit (INDEPENDENT_AMBULATORY_CARE_PROVIDER_SITE_OTHER)
Admission: RE | Admit: 2013-09-27 | Discharge: 2013-09-27 | Disposition: A | Discharge status: Home / Self Care | Payer: BC Managed Care – PPO | Source: Ambulatory Visit | Attending: Internal Medicine | Admitting: Internal Medicine

## 2013-09-27 DIAGNOSIS — R079 Chest pain, unspecified: Secondary | ICD-10-CM

## 2013-09-27 NOTE — Telephone Encounter (Signed)
Spoke with pt, advised to have xray done either today or Monday. Appt scheduled with Dr. Darrick Huntsman on 10/01/13. Pt verbalized understanding.

## 2013-10-01 ENCOUNTER — Ambulatory Visit (INDEPENDENT_AMBULATORY_CARE_PROVIDER_SITE_OTHER): Payer: BC Managed Care – PPO | Admitting: Internal Medicine

## 2013-10-01 ENCOUNTER — Encounter (INDEPENDENT_AMBULATORY_CARE_PROVIDER_SITE_OTHER): Payer: Self-pay

## 2013-10-01 VITALS — BP 126/80 | HR 78 | Temp 98.6°F | Wt 191.0 lb

## 2013-10-01 DIAGNOSIS — M546 Pain in thoracic spine: Secondary | ICD-10-CM

## 2013-10-01 DIAGNOSIS — E669 Obesity, unspecified: Secondary | ICD-10-CM

## 2013-10-01 DIAGNOSIS — D709 Neutropenia, unspecified: Secondary | ICD-10-CM

## 2013-10-01 NOTE — Progress Notes (Signed)
Pre visit review using our clinic review tool, if applicable. No additional management support is needed unless otherwise documented below in the visit note. 

## 2013-10-01 NOTE — Patient Instructions (Signed)
Your chest and back pain is due to arthritis and bone spurs in your back that were causing your muscles to spasm.  You can use ibuprofen or aleve for the next occurrence.  You should do exercises to strengthen your back muscles  Back Exercises Back exercises help treat and prevent back injuries. The goal is to increase your strength in your belly (abdominal) and back muscles. These exercises can also help with flexibility. Start these exercises when told by your doctor. HOME CARE Back exercises include: Pelvic Tilt.  Lie on your back with your knees bent. Tilt your pelvis until the lower part of your back is against the floor. Hold this position 5 to 10 sec. Repeat this exercise 5 to 10 times. Knee to Chest.  Pull 1 knee up against your chest and hold for 20 to 30 seconds. Repeat this with the other knee. This may be done with the other leg straight or bent, whichever feels better. Then, pull both knees up against your chest. Sit-Ups or Curl-Ups.  Bend your knees 90 degrees. Start with tilting your pelvis, and do a partial, slow sit-up. Only lift your upper half 30 to 45 degrees off the floor. Take at least 2 to 3 seonds for each sit-up. Do not do sit-ups with your knees out straight. If partial sit-ups are difficult, simply do the above but with only tightening your belly (abdominal) muscles and holding it as told. Hip-Lift.  Lie on your back with your knees flexed 90 degrees. Push down with your feet and shoulders as you raise your hips 2 inches off the floor. Hold for 10 seconds, repeat 5 to 10 times. Back Arches.  Lie on your stomach. Prop yourself up on bent elbows. Slowly press on your hands, causing an arch in your low back. Repeat 3 to 5 times. Shoulder-Lifts.  Lie face down with arms beside your body. Keep hips and belly pressed to floor as you slowly lift your head and shoulders off the floor. Do not overdo your exercises. Be careful in the beginning. Exercises may cause you  some mild back discomfort. If the pain lasts for more than 15 minutes, stop the exercises until you see your doctor. Improvement with exercise for back problems is slow.  Document Released: 11/05/2010 Document Revised: 12/26/2011 Document Reviewed: 08/04/2011 [facility] Patient Information 2014 [place], [place]. Back Injury Prevention Back injuries can be extremely painful and difficult to heal. After having one back injury, you are much more likely to experience another later on. It is important to learn how to avoid injuring or re-injuring your back. The following tips can help you to prevent a back injury. PHYSICAL FITNESS  Exercise regularly and try to develop good tone in your abdominal muscles. Your abdominal muscles provide a lot of the support needed by your back.  Do aerobic exercises (walking, jogging, biking, swimming) regularly.  Do exercises that increase balance and strength (tai chi, yoga) regularly. This can decrease your risk of falling and injuring your back.  Stretch before and after exercising.  Maintain a healthy weight. The more you weigh, the more stress is placed on your back. For every pound of weight, 10 times that amount of pressure is placed on the back. DIET  Talk to your caregiver about how much calcium and vitamin D you need per day. These nutrients help to prevent weakening of the bones (osteoporosis). Osteoporosis can cause broken (fractured) bones that lead to back pain.  Include good sources of calcium in your diet,  such as dairy products, green, leafy vegetables, and products with calcium added (fortified).  Include good sources of vitamin D in your diet, such as milk and foods that are fortified with vitamin D.  Consider taking a nutritional supplement or a multivitamin if needed.  Stop smoking if you smoke. POSTURE  Sit and stand up straight. Avoid leaning forward when you sit or hunching over when you stand.  Choose chairs with good low back (lumbar)  support.  If you work at a desk, sit close to your work so you do not need to lean over. Keep your chin tucked in. Keep your neck drawn back and elbows bent at a right angle. Your arms should look like the letter "L."  Sit high and close to the steering wheel when you drive. Add a lumbar support to your car seat if needed.  Avoid sitting or standing in one position for too long. Take breaks to get up, stretch, and walk around at least once every hour. Take breaks if you are driving for long periods of time.  Sleep on your side with your knees slightly bent, or sleep on your back with a pillow under your knees. Do not sleep on your stomach. LIFTING, TWISTING, AND REACHING  Avoid heavy lifting, especially repetitive lifting. If you must do heavy lifting:  Stretch before lifting.  Work slowly.  Rest between lifts.  Use carts and dollies to move objects when possible.  Make several small trips instead of carrying 1 heavy load.  Ask for help when you need it.  Ask for help when moving big, awkward objects.  Follow these steps when lifting:  Stand with your feet shoulder-width apart.  Get as close to the object as you can. Do not try to pick up heavy objects that are far from your body.  Use handles or lifting straps if they are available.  Bend at your knees. Squat down, but keep your heels off the floor.  Keep your shoulders pulled back, your chin tucked in, and your back straight.  Lift the object slowly, tightening the muscles in your legs, abdomen, and buttocks. Keep the object as close to the center of your body as possible.  When you put a load down, use these same guidelines in reverse.  Do not:  Lift the object above your waist.  Twist at the waist while lifting or carrying a load. Move your feet if you need to turn, not your waist.  Bend over without bending at your knees.  Avoid reaching over your head, across a table, or for an object on a high surface. OTHER  TIPS  Avoid wet floors and keep sidewalks clear of ice to prevent falls.  Do not sleep on a mattress that is too soft or too hard.  Keep items that are used frequently within easy reach.  Put heavier objects on shelves at waist level and lighter objects on lower or higher shelves.  Find ways to decrease your stress, such as exercise, massage, or relaxation techniques. Stress can build up in your muscles. Tense muscles are more vulnerable to injury.  Seek treatment for depression or anxiety if needed. These conditions can increase your risk of developing back pain. SEEK MEDICAL CARE IF:  You injure your back.  You have questions about diet, exercise, or other ways to prevent back injuries. MAKE SURE YOU:  Understand these instructions.  Will watch your condition.  Will get help right away if you are not doing well or  get worse. Document Released: 11/10/2004 Document Revised: 12/26/2011 Document Reviewed: 11/14/2011 [facility] Patient Information 2014 [place], [place].

## 2013-10-01 NOTE — Progress Notes (Addendum)
Patient ID: Kari Lyons, female   DOB: 15-Jul-1951, 62 y.o.   MRN: [id number]   Patient Active Problem List   Diagnosis Date Noted  . Thoracic back pain 10/03/2013  . Benign neoplasm of breast 04/16/2013  . Neutropenia, unspecified 03/06/2013  . Obesity, unspecified 03/06/2013  . Intraductal papilloma of left breast 02/20/2013  . Other sign and symptom in breast 02/11/2013  . Rectal bleeding 01/26/2013  . Right facial swelling 01/26/2013  . History of vaginal discharge 01/26/2013    Subjective:  CC:   Chief Complaint  Patient presents with  . pain under breast    HPI:   Kari Lyons a 62 y.o. female who presents with a 2 week history of dull chest pain bilateral in location  occurring under both breasts and radiating to ribcage  back and both shoulders and made worse by turning.  Happened about two weeks ago and she first noticed  the discomfort while  lying in the bed .  The pain was not aggravated by deep breathing or by exertion . However she did note that it is aggravated by twisting her upper back. Accompanied by a lot of gassy feeling  No nausea,  But burping a lot.  Took some homeopathic "cell salts" that she dissolved under tongue.   The remedy helped immediately but provided transeint relief, so  She took them for 3 days.  Then the pain resolved.     Past Medical History  Diagnosis Date  . IBS (irritable bowel syndrome)   . GERD (gastroesophageal reflux disease)   . Depression   . Hypertension   . Edema   . Arrhythmia 2008    Past Surgical History  Procedure Laterality Date  . Tonsilectomy, adenoidectomy, bilateral myringotomy and tubes    . Breast biopsy    . Bilateral salpingoophorectomy  2002    due to fh of ovarian ca  . Abdominal hysterectomy  1985  . Colonoscopy  2012  . Breast surgery Left 02-26-13    subareloar duct excision and mass       The following portions of the patient's history were reviewed and updated as appropriate: Allergies,  current medications, and problem list.    Review of Systems:   12 Pt  review of systems was negative except those addressed in the HPI,     History   Social History  . Marital Status: Married    Spouse Name: N/A    Number of Children: N/A  . Years of Education: N/A   Occupational History  . Not on file.   Social History Main Topics  . Smoking status: Never Smoker   . Smokeless tobacco: Never Used  . Alcohol Use: No  . Drug Use: No  . Sexual Activity: Not on file   Other Topics Concern  . Not on file   Social History Narrative  . No narrative on file    Objective:  Filed Vitals:   10/01/13 0800  BP: 126/80  Pulse: 78  Temp: 98.6 F (37 C)     General appearance: alert, cooperative and appears stated age Ears: normal TM's and external ear canals both ears Throat: lips, mucosa, and tongue normal; teeth and gums normal Neck: no adenopathy, no carotid bruit, supple, symmetrical, trachea midline and thyroid not enlarged, symmetric, no tenderness/mass/nodules Back: symmetric, no curvature. ROM normal. No CVA tenderness. Lungs: clear to auscultation bilaterally Heart: regular rate and rhythm, S1, S2 normal, no murmur, click, rub or gallop Abdomen: soft, non-tender;  bowel sounds normal; no masses,  no organomegaly Pulses: 2+ and symmetric Skin: Skin color, texture, turgor normal. No rashes or lesions Lymph nodes: Cervical, supraclavicular, and axillary nodes normal. MSK: some paraspinous muscle spasm noted. No vertebral tenderness.  ROM limited by pain with side bending  Assessment and Plan: Thoracic back pain Improved currently, with degenerative changes noted on thoracic spine films resulting in muscle spasm and pain radiating to chest area. Continue use of anti-inflammatories and back strengthening Exercises given.  Obesity, unspecified I have addressed  BMI and recommended wt loss of 10% of body weight over the next 6 months using a low glycemic index diet  and regular exercise a minimum of 5 days per week.    Neutropenia, unspecified Asymptomatic, with no history of recurrent infections. No signs of b12 or folate deficiency.    Lab Results  Component Value Date   WBC 3.7* 02/22/2013   HGB 12.8 02/22/2013   HCT 38.2 02/22/2013   MCV 86.6 02/22/2013   PLT 188.0 02/22/2013      Updated Medication List Outpatient Encounter Prescriptions as of 10/01/2013  Medication Sig  . Bearberry, Uva-Ursi, (UVA URSI PO) Take 500 mg by mouth 3 (three) times daily.  . Cholecalciferol (VITAMIN D3) 2000 UNITS TABS Take 2,000 Units by mouth daily.  . Chromium 200 MCG TABS Take 1 tablet by mouth 2 (two) times daily.  Marland Kitchen CINNAMON PO Take 1,000 tablets by mouth 2 (two) times daily.  Marland Kitchen docusate sodium (COLACE) 100 MG capsule Take 100 mg by mouth 3 (three) times daily as needed for constipation.  . Fenugreek 610 MG CAPS Take 1 capsule by mouth daily.  . Ginger, Zingiber officinalis, (GINGER PO) 1 teaspoon by mouth daily.  Marland Kitchen HAWTHORN BERRIES PO Take 150 mg by mouth 3 (three) times daily.   Marland Kitchen OVER THE COUNTER MEDICATION Take 1 capsule by mouth daily. THYROID COMPLEX  . OVER THE COUNTER MEDICATION Take 2 capsules by mouth daily. CARDIO ADVANCED  . OVER THE COUNTER MEDICATION Take 2 capsules by mouth at bedtime. Valarian Root  . OVER THE COUNTER MEDICATION Fennel 1 teaspoon by mouth daily.  . Potassium 99 MG TABS Take 1 tablet by mouth daily.

## 2013-10-03 ENCOUNTER — Encounter: Payer: Self-pay | Admitting: Internal Medicine

## 2013-10-03 DIAGNOSIS — M546 Pain in thoracic spine: Secondary | ICD-10-CM | POA: Insufficient documentation

## 2013-10-03 NOTE — Assessment & Plan Note (Signed)
I have addressed  BMI and recommended wt loss of 10% of body weight over the next 6 months using a low glycemic index diet and regular exercise a minimum of 5 days per week.   

## 2013-10-03 NOTE — Assessment & Plan Note (Signed)
Improved currently, with degenerative changes noted on thoracic spine films resulting in muscle spasm and pain radiating to chest area. Continue use of anti-inflammatories and back strengthening Exercises given.

## 2013-10-03 NOTE — Assessment & Plan Note (Signed)
Asymptomatic, with no history of recurrent infections. No signs of b12 or folate deficiency.    Lab Results  Component Value Date   WBC 3.7* 02/22/2013   HGB 12.8 02/22/2013   HCT 38.2 02/22/2013   MCV 86.6 02/22/2013   PLT 188.0 02/22/2013

## 2013-10-14 ENCOUNTER — Ambulatory Visit: Payer: BC Managed Care – PPO | Admitting: Internal Medicine

## 2013-10-30 ENCOUNTER — Telehealth: Payer: Self-pay | Admitting: Internal Medicine

## 2013-10-30 NOTE — Telephone Encounter (Signed)
Patient Information:  Caller Name: Aaleigha  Phone: [phone]  Patient: Kari Lyons, Kari Lyons  Gender: Female  DOB: [birth year]  Age: 63 Years  PCP: Deborra Medina (Adults only)  Office Follow Up:  Does the office need to follow up with this patient?: Yes  Instructions For The Office: Says uses reflexology - area for heart is tender. Please review.  Patient can be reached at  [phone] - need help to reinforce her going to ER.  RN Note:  Spoke with Becky in office - does not have Appt available with Dr Derrel Nip, sending patient to ER now.  Patient keeps asking when she can get EKG in office, keeps saying does not want to go to ER.  Symptoms  Reason For Call & Symptoms: Stomach area pain started last night, pain is constant and goes thru to her back.  Patient called her cardiologist at [facility] and they advised getting EKG right away - suggested at PMD.  Patient does not want to go to ER.  Reviewed Health History In EMR: Yes  Reviewed Medications In EMR: Yes  Reviewed Allergies In EMR: Yes  Reviewed Surgeries / Procedures: Yes  Date of Onset of Symptoms: 10/29/2013  Treatments Tried: not eaten much, sitting around  Treatments Tried Worked: Yes  Guideline(s) Used:  Abdominal Pain - Female  Abdominal Pain - Upper  Disposition Per Guideline:   Go to ED Now  Reason For Disposition Reached:   Pain lasting > 10 minutes and over 45 years old  Advice Given:  N/A  Patient Refused Recommendation:  Patient Refused Care Advice  Patient saying does not want to go to ER, does not think she will go.  Urging her to go to be checked right away.  Keeps asking when she can get EKG if office.

## 2013-10-30 NOTE — Progress Notes (Signed)
Patient ID: Kari Lyons, female   DOB: 24-Jan-1951, 63 y.o.   MRN: [id number]

## 2013-10-30 NOTE — Telephone Encounter (Signed)
Advised patient she should got to ed multiple times patient refused to go to ED.

## 2013-10-30 NOTE — Telephone Encounter (Signed)
I will not do an EKG on a patient without an appt.  And if she is having chest pain an dEKG is only a small part of the workup.   there is very little I can do here, so she needs to go to the ER

## 2013-10-30 NOTE — Telephone Encounter (Signed)
Notified patient of advice, and patient stated thank you but not going to ER.

## 2014-02-05 DIAGNOSIS — Z7189 Other specified counseling: Secondary | ICD-10-CM | POA: Insufficient documentation

## 2014-02-19 ENCOUNTER — Encounter: Payer: Self-pay | Admitting: General Surgery

## 2014-02-26 ENCOUNTER — Ambulatory Visit: Payer: BC Managed Care – PPO | Admitting: General Surgery

## 2014-02-27 ENCOUNTER — Ambulatory Visit: Payer: BC Managed Care – PPO | Admitting: General Surgery

## 2014-03-11 ENCOUNTER — Encounter: Payer: Self-pay | Admitting: General Surgery

## 2014-03-11 ENCOUNTER — Ambulatory Visit (INDEPENDENT_AMBULATORY_CARE_PROVIDER_SITE_OTHER): Payer: BC Managed Care – PPO | Admitting: General Surgery

## 2014-03-11 VITALS — BP 138/76 | HR 72 | Resp 14 | Ht 64.0 in | Wt 186.0 lb

## 2014-03-11 DIAGNOSIS — Z9889 Other specified postprocedural states: Secondary | ICD-10-CM

## 2014-03-11 DIAGNOSIS — Z1239 Encounter for other screening for malignant neoplasm of breast: Secondary | ICD-10-CM

## 2014-03-11 NOTE — Progress Notes (Signed)
Patient ID: Kari Lyons, female   DOB: [birth year], 63 y.o.   MRN: [id number]  Chief Complaint  Patient presents with  . Follow-up    mammogram    HPI Kari Lyons is a 63 y.o. female who presents for a breast evaluation. The most recent mammogram was done on .02/13/14.at [facility].Patient does perform regular self breast checks and gets regular mammograms done.  One year ago she had a excision of the left breast intraductal papilloma.  HPI  Past Medical History  Diagnosis Date  . IBS (irritable bowel syndrome)   . GERD (gastroesophageal reflux disease)   . Depression   . Hypertension   . Edema   . Arrhythmia 2008    Past Surgical History  Procedure Laterality Date  . Tonsilectomy, adenoidectomy, bilateral myringotomy and tubes    . Breast biopsy    . Bilateral salpingoophorectomy  2002    due to fh of ovarian ca  . Abdominal hysterectomy  1985  . Colonoscopy  2012  . Breast surgery Left 02-26-13    subareloar duct excision and mass    Family History  Problem Relation Age of Onset  . Hypertension Mother   . Arthritis Mother   . Parkinson's disease Father   . Hypertension Sister   . Heart disease Daughter   . Cancer Sister 73    ovarian ca  . Cancer Brother     multiple myeloma  . Diabetes Brother   . Cancer Maternal Grandmother     colon ca  . Cancer Paternal Grandmother     breast ca    Social History History  Substance Use Topics  . Smoking status: Never Smoker   . Smokeless tobacco: Never Used  . Alcohol Use: No    Allergies  Allergen Reactions  . Aloe Rash  . Aloe Vera Rash    Current Outpatient Prescriptions  Medication Sig Dispense Refill  . Bearberry, Uva-Ursi, (UVA URSI PO) Take 500 mg by mouth 3 (three) times daily.      . Cholecalciferol (VITAMIN D3) 2000 UNITS TABS Take 2,000 Units by mouth daily.      . Chromium 200 MCG TABS Take 1 tablet by mouth 2 (two) times daily.      Marland Kitchen CINNAMON PO Take 1,000 tablets by mouth 2 (two) times daily.       Marland Kitchen docusate sodium (COLACE) 100 MG capsule Take 100 mg by mouth 3 (three) times daily as needed for constipation.      . Fenugreek 610 MG CAPS Take 1 capsule by mouth daily.      . Ginger, Zingiber officinalis, (GINGER PO) 1 teaspoon by mouth daily.      Marland Kitchen HAWTHORN BERRIES PO Take 150 mg by mouth 3 (three) times daily.       Marland Kitchen OVER THE COUNTER MEDICATION Take 1 capsule by mouth daily. THYROID COMPLEX      . OVER THE COUNTER MEDICATION Take 2 capsules by mouth daily. CARDIO ADVANCED      . OVER THE COUNTER MEDICATION Take 2 capsules by mouth at bedtime. Valarian Root      . OVER THE COUNTER MEDICATION Fennel 1 teaspoon by mouth daily.      . Potassium 99 MG TABS Take 1 tablet by mouth daily.       No current facility-administered medications for this visit.    Review of Systems Review of Systems  Constitutional: Negative.   Respiratory: Negative.   Cardiovascular: Negative.     Blood pressure  138/76, pulse 72, resp. rate 14, height 5' 4" (1.626 m), weight 186 lb (84.369 kg).  Physical Exam Physical Exam  Constitutional: She is oriented to person, place, and time. She appears well-developed and well-nourished.  Eyes: Conjunctivae are normal. No scleral icterus.  Neck: Neck supple. No mass and no thyromegaly present.  Cardiovascular: Normal rate, regular rhythm and normal heart sounds.   Pulmonary/Chest: Effort normal and breath sounds normal. Right breast exhibits no inverted nipple, no mass, no nipple discharge, no skin change and no tenderness. Left breast exhibits no inverted nipple, no mass, no nipple discharge, no skin change and no tenderness.  Abdominal: Soft. Bowel sounds are normal. There is no tenderness.  Lymphadenopathy:    She has no cervical adenopathy.    She has no axillary adenopathy.  Neurological: She is alert and oriented to person, place, and time.  Skin: Skin is warm and dry.    Data Reviewed Mammogram reviewed  Assessment    Stable exam. History of  intraductal papilloma    Plan    The patient has been asked to return to the office in one year with a bilateral screening mammogram.       Seeplaputhur G Sankar 03/11/2014, 10:38 AM

## 2014-03-11 NOTE — Patient Instructions (Signed)
Continue self breast exams. Call office for any new breast issues or concerns. 

## 2014-08-18 ENCOUNTER — Encounter: Payer: Self-pay | Admitting: General Surgery

## 2015-01-16 ENCOUNTER — Encounter: Payer: Self-pay | Admitting: Internal Medicine

## 2015-01-16 ENCOUNTER — Ambulatory Visit (INDEPENDENT_AMBULATORY_CARE_PROVIDER_SITE_OTHER): Payer: BC Managed Care – PPO | Admitting: Internal Medicine

## 2015-01-16 VITALS — BP 140/84 | HR 64 | Temp 97.8°F | Resp 16 | Ht 64.0 in | Wt 190.0 lb

## 2015-01-16 DIAGNOSIS — R319 Hematuria, unspecified: Secondary | ICD-10-CM | POA: Diagnosis not present

## 2015-01-16 DIAGNOSIS — Z8742 Personal history of other diseases of the female genital tract: Secondary | ICD-10-CM | POA: Diagnosis not present

## 2015-01-16 DIAGNOSIS — E785 Hyperlipidemia, unspecified: Secondary | ICD-10-CM

## 2015-01-16 DIAGNOSIS — Z1159 Encounter for screening for other viral diseases: Secondary | ICD-10-CM

## 2015-01-16 DIAGNOSIS — N368 Other specified disorders of urethra: Secondary | ICD-10-CM

## 2015-01-16 DIAGNOSIS — E559 Vitamin D deficiency, unspecified: Secondary | ICD-10-CM

## 2015-01-16 DIAGNOSIS — R5383 Other fatigue: Secondary | ICD-10-CM

## 2015-01-16 LAB — URINALYSIS, ROUTINE W REFLEX MICROSCOPIC
Bilirubin Urine: NEGATIVE
Hgb urine dipstick: NEGATIVE
Ketones, ur: NEGATIVE
Leukocytes, UA: NEGATIVE
Nitrite: NEGATIVE
RBC / HPF: NONE SEEN (ref 0–?)
Specific Gravity, Urine: <=1.005 — AB (ref 1.000–1.030)
Total Protein, Urine: NEGATIVE
Urine Glucose: NEGATIVE
Urobilinogen, UA: 0.2 (ref 0.0–1.0)
WBC, UA: NONE SEEN (ref 0–?)
pH: 7.5 (ref 5.0–8.0)

## 2015-01-16 LAB — COMPREHENSIVE METABOLIC PANEL
ALT: 29 U/L (ref 0–35)
AST: 29 U/L (ref 0–37)
Albumin: 4.2 g/dL (ref 3.5–5.2)
Alkaline Phosphatase: 61 U/L (ref 39–117)
BUN: 14 mg/dL (ref 6–23)
CO2: 32 mEq/L (ref 19–32)
Calcium: 9.7 mg/dL (ref 8.4–10.5)
Chloride: 104 mEq/L (ref 96–112)
Creatinine, Ser: 0.75 mg/dL (ref 0.40–1.20)
GFR: 99.84 mL/min (ref >60.00)
Glucose, Bld: 87 mg/dL (ref 70–99)
Potassium: 4.4 mEq/L (ref 3.5–5.1)
Sodium: 139 mEq/L (ref 135–145)
Total Bilirubin: 0.5 mg/dL (ref 0.2–1.2)
Total Protein: 6.9 g/dL (ref 6.0–8.3)

## 2015-01-16 LAB — CBC WITH DIFFERENTIAL/PLATELET
Basophils Absolute: 0 10*3/uL (ref 0.0–0.1)
Basophils Relative: 0.3 % (ref 0.0–3.0)
Eosinophils Absolute: 0 10*3/uL (ref 0.0–0.7)
Eosinophils Relative: 0.9 % (ref 0.0–5.0)
HCT: 38.3 % (ref 36.0–46.0)
Hemoglobin: 12.7 g/dL (ref 12.0–15.0)
Lymphocytes Relative: 46.1 % — ABNORMAL HIGH (ref 12.0–46.0)
Lymphs Abs: 2.2 10*3/uL (ref 0.7–4.0)
MCHC: 33.2 g/dL (ref 30.0–36.0)
MCV: 85 fl (ref 78.0–100.0)
Monocytes Absolute: 0.3 10*3/uL (ref 0.1–1.0)
Monocytes Relative: 7.1 % (ref 3.0–12.0)
Neutro Abs: 2.1 10*3/uL (ref 1.4–7.7)
Neutrophils Relative %: 45.6 % (ref 43.0–77.0)
Platelets: 220 10*3/uL (ref 150.0–400.0)
RBC: 4.5 Mil/uL (ref 3.87–5.11)
RDW: 14.3 % (ref 11.5–15.5)
WBC: 4.7 10*3/uL (ref 4.0–10.5)

## 2015-01-16 LAB — POCT URINALYSIS DIPSTICK
Bilirubin, UA: NEGATIVE
Blood, UA: NEGATIVE
Glucose, UA: NEGATIVE
Ketones, UA: NEGATIVE
Leukocytes, UA: NEGATIVE
Nitrite, UA: NEGATIVE
Protein, UA: NEGATIVE
Spec Grav, UA: 1.015
Urobilinogen, UA: 0.2
pH, UA: 7

## 2015-01-16 LAB — LIPID PANEL
Cholesterol: 143 mg/dL (ref 0–200)
HDL: 50.5 mg/dL (ref >39.00)
LDL Cholesterol: 73 mg/dL (ref 0–99)
NonHDL: 92.5
Total CHOL/HDL Ratio: 3
Triglycerides: 96 mg/dL (ref 0.0–149.0)
VLDL: 19.2 mg/dL (ref 0.0–40.0)

## 2015-01-16 LAB — VITAMIN D 25 HYDROXY (VIT D DEFICIENCY, FRACTURES): VITD: 34.47 ng/mL (ref 30.00–100.00)

## 2015-01-16 LAB — TSH: TSH: 1.77 u[IU]/mL (ref 0.35–4.50)

## 2015-01-16 NOTE — Progress Notes (Signed)
Patient ID: Kari Lyons, female   DOB: 1951-01-31, 64 y.o.   MRN: [id number]  Patient Active Problem List   Diagnosis Date Noted  . Urethral bleeding 01/18/2015  . Neutropenia, unspecified 03/06/2013  . Obesity, unspecified 03/06/2013  . Other sign and symptom in breast 02/11/2013  . History of vaginal discharge 01/26/2013    Subjective:  CC:   Chief Complaint  Patient presents with  . Acute Visit    For abdominal swelling , possible blood in urine.    HPI:   Kari Lyons is a 64 y.o. female who presents for evaluation of multiple pelvic complaints.   Abdominal bloaitng.  Has history of IBS .  Has been moving her  bowels 2 to 3 times daily formed stools most of the time   Concerned that she has been seeing  blood in urine because she is seeing a dark stain in the front of her underwear ,  Wonders if her urethra is the source, since she has been having suprapubic pain on the left side. Notes that the urine doesn't come out in a stream ,States that her urine  comes out in a spray .  Has been this way for a year or more,  And makes a mess.   No prior urethral instrumentation , and no history of kidney stones .  S/p TAH/BSO remotely    Past Medical History  Diagnosis Date  . IBS (irritable bowel syndrome)   . GERD (gastroesophageal reflux disease)   . Depression   . Hypertension   . Edema   . Arrhythmia 2008    Past Surgical History  Procedure Laterality Date  . Tonsilectomy, adenoidectomy, bilateral myringotomy and tubes    . Breast biopsy    . Bilateral salpingoophorectomy  2002    due to fh of ovarian ca  . Abdominal hysterectomy  1985  . Colonoscopy  2012  . Breast surgery Left 02-26-13    subareloar duct excision and mass       The following portions of the patient's history were reviewed and updated as appropriate: Allergies, current medications, and problem list.    Review of Systems:   Patient denies headache, fevers, malaise, unintentional weight  loss, skin rash, eye pain, sinus congestion and sinus pain, sore throat, dysphagia,  hemoptysis , cough, dyspnea, wheezing, chest pain, palpitations, orthopnea, edema, abdominal pain, nausea, melena, diarrhea, constipation, flank pain, dysuria, hematuria, urinary  Frequency, nocturia, numbness, tingling, seizures,  Focal weakness, Loss of consciousness,  Tremor, insomnia, depression, anxiety, and suicidal ideation.     History   Social History  . Marital Status: Married    Spouse Name: N/A  . Number of Children: N/A  . Years of Education: N/A   Occupational History  . Not on file.   Social History Main Topics  . Smoking status: Never Smoker   . Smokeless tobacco: Never Used  . Alcohol Use: No  . Drug Use: No  . Sexual Activity: Not on file   Other Topics Concern  . Not on file   Social History Narrative    Objective:  Filed Vitals:   01/16/15 1050  BP: 140/84  Pulse: 64  Temp: 97.8 F (36.6 C)  Resp: 16     General Appearance:    Alert, cooperative, no distress, appears stated age  Head:    Normocephalic, without obvious abnormality, atraumatic     Neck:   Supple, symmetrical, trachea midline, no adenopathy;    thyroid:  no enlargement/tenderness/nodules;  no carotid   bruit or JVD  Back:     Symmetric, no curvature, ROM normal, no CVA tenderness  Lungs:     Clear to auscultation bilaterally, respirations unlabored  Chest Wall:    No tenderness or deformity   Heart:    Regular rate and rhythm, S1 and S2 normal, no murmur, rub   or gallop     Abdomen:     Soft, non-tender, bowel sounds active all four quadrants,    no masses, no organomegaly  Genitalia:    Pelvic: cervix and uterus surgically absent,  Vaginal walls atrophic  Appearing,  No discharge or erythema. external genitalia normal, no adnexal masses or tenderness,        Assessment and Plan:  Urethral bleeding There are no obvious signs of urethral bleeding on pelvic exam today and dipstick UA is  normal.  Given her concurrent urinary issues,  referral to Urogyn is recommended.     Updated Medication List Outpatient Encounter Prescriptions as of 01/16/2015  Medication Sig  . Bearberry, Uva-Ursi, (UVA URSI PO) Take 500 mg by mouth 3 (three) times daily.  . Cholecalciferol (VITAMIN D3) 2000 UNITS TABS Take 2,000 Units by mouth daily.  . Chromium 200 MCG TABS Take 1 tablet by mouth 2 (two) times daily.  Marland Kitchen CINNAMON PO Take 1,000 tablets by mouth 2 (two) times daily.  . Fenugreek 610 MG CAPS Take 1 capsule by mouth daily.  Marland Kitchen HAWTHORN BERRIES PO Take 150 mg by mouth 3 (three) times daily.   Marland Kitchen OVER THE COUNTER MEDICATION Take 1 capsule by mouth daily. THYROID COMPLEX  . OVER THE COUNTER MEDICATION Take 2 capsules by mouth at bedtime. Valarian Root  . OVER THE COUNTER MEDICATION Fennel 1 teaspoon by mouth daily.  . Potassium 99 MG TABS Take 1 tablet by mouth daily.  Marland Kitchen docusate sodium (COLACE) 100 MG capsule Take 100 mg by mouth 3 (three) times daily as needed for constipation.  . Ginger, Zingiber officinalis, (GINGER PO) 1 teaspoon by mouth daily.  Marland Kitchen OVER THE COUNTER MEDICATION Take 2 capsules by mouth daily. CARDIO ADVANCED     Orders Placed This Encounter  Procedures  . Urine Culture  . Urinalysis, Routine w reflex microscopic  . CBC with Differential/Platelet  . Comprehensive metabolic panel  . TSH  . Lipid panel  . Vit D  25 hydroxy (rtn osteoporosis monitoring)  . Hepatitis C antibody  . POCT Urinalysis Dipstick    Return in about 3 months (around 04/17/2015).

## 2015-01-16 NOTE — Progress Notes (Signed)
Pre-visit discussion using our clinic review tool. No additional management support is needed unless otherwise documented below in the visit note.  

## 2015-01-16 NOTE — Patient Instructions (Signed)
You have no signs of a UTI by today's UA but we will send it for further analysis  We will schedule your physical in July and do the referrals then along wiht your mammogram

## 2015-01-17 LAB — HEPATITIS C ANTIBODY: HCV Ab: NEGATIVE

## 2015-01-18 ENCOUNTER — Encounter: Payer: Self-pay | Admitting: Internal Medicine

## 2015-01-18 DIAGNOSIS — N368 Other specified disorders of urethra: Secondary | ICD-10-CM | POA: Insufficient documentation

## 2015-01-18 LAB — URINE CULTURE
Colony Count: NO GROWTH
Organism ID, Bacteria: NO GROWTH

## 2015-01-18 NOTE — Assessment & Plan Note (Signed)
There are no obvious signs of urethral bleeding on pelvic exam today and dipstick UA is normal.  Given her concurrent urinary issues,  referral to Urogyn is recommended.

## 2015-01-19 ENCOUNTER — Encounter: Payer: Self-pay | Admitting: *Deleted

## 2015-01-30 ENCOUNTER — Other Ambulatory Visit: Payer: Self-pay | Admitting: General Surgery

## 2015-01-30 DIAGNOSIS — Z1231 Encounter for screening mammogram for malignant neoplasm of breast: Secondary | ICD-10-CM

## 2015-02-06 NOTE — Op Note (Signed)
PATIENT NAME:  Kari Lyons, Kari Lyons MR#:  [id number] DATE OF BIRTH:  [birth year]  DATE OF PROCEDURE:  02/26/2013  PREOPERATIVE DIAGNOSIS: Left breast bloody nipple discharge and left breast subareolar mass.   POSTOPERATIVE DIAGNOSIS:  Left breast bloody nipple discharge and left breast subareolar mass.  OPERATION:  Excision left breast subareolar ducts and mass.   SURGEON:   Mckinley Jewel, MD  ANESTHESIA:  General.   COMPLICATIONS:  None.   ESTIMATED BLOOD LOSS:  Minimal.   DRAINS:  None.   DESCRIPTION OF PROCEDURE: The patient was placed in the supine position on the operating table and put to sleep with LMA. The left breast was prepped and draped out as sterile field. With ultrasound probe, the small mass-like density along the areolar margin at 3 o'clock was identified, and a circumareolar incision was planned from the 1 o'clock to 4 o'clock position. Incision was then made, and the areolar skin was elevated up to the nipple. The skin flap on the lateral aspect was also created, and the entire tissue and the underlying region extending up to the nipple region was excised out from this quadrant. This included a firm, small mass in the lateral aspect. No other gross abnormality was identified. No other palpable findings. The deeper tissues were closed with 2-0 Vicryl and the skin was closed with subcuticular 4-0 Vicryl, covered with Dermabond. Procedure was well-tolerated. She was subsequently returned to the recovery room in stable condition.    ____________________________ S.Robinette Haines, MD sgs:mr D: 02/26/2013 18:24:08 ET T: 02/26/2013 20:23:49 ET JOB#: [id number]  cc: Synthia Innocent. Jamal Collin, MD, <Dictator> [facility] Robinette Haines MD ELECTRONICALLY SIGNED 02/27/2013 7:19

## 2015-02-24 ENCOUNTER — Ambulatory Visit: Payer: BC Managed Care – PPO | Admitting: General Surgery

## 2015-02-24 ENCOUNTER — Ambulatory Visit
Admission: RE | Admit: 2015-02-24 | Discharge: 2015-02-24 | Disposition: A | Discharge status: Home / Self Care | Payer: BC Managed Care – PPO | Source: Ambulatory Visit | Attending: General Surgery | Admitting: General Surgery

## 2015-02-24 DIAGNOSIS — Z1231 Encounter for screening mammogram for malignant neoplasm of breast: Secondary | ICD-10-CM | POA: Diagnosis present

## 2015-03-10 ENCOUNTER — Ambulatory Visit (INDEPENDENT_AMBULATORY_CARE_PROVIDER_SITE_OTHER): Payer: BC Managed Care – PPO | Admitting: General Surgery

## 2015-03-10 ENCOUNTER — Ambulatory Visit: Payer: BC Managed Care – PPO | Admitting: General Surgery

## 2015-03-10 ENCOUNTER — Encounter: Payer: Self-pay | Admitting: General Surgery

## 2015-03-10 DIAGNOSIS — Z87898 Personal history of other specified conditions: Secondary | ICD-10-CM

## 2015-03-10 DIAGNOSIS — Z86018 Personal history of other benign neoplasm: Secondary | ICD-10-CM

## 2015-03-10 NOTE — Patient Instructions (Addendum)
Patient will be asked to return to the office in one year with a bilateral screening mammogram.  Continue self breast exams. Call office for any new breast issues or concerns.  

## 2015-03-10 NOTE — Progress Notes (Signed)
Patient ID: Kari Lyons, female   DOB: 12-23-1950, 64 y.o.   MRN: [id number]  Chief Complaint  Patient presents with  . Follow-up    mammogram    HPI Kari Lyons is a 64 y.o. female who presents for a breast evaluation. The most recent mammogram was done on 02/24/15 .  Patient does perform regular self breast checks and gets regular mammograms done.  Denies any new breast issues. Occasional right nipple discharge.  HPI  Past Medical History  Diagnosis Date  . IBS (irritable bowel syndrome)   . GERD (gastroesophageal reflux disease)   . Depression   . Hypertension   . Edema   . Arrhythmia 2008    Past Surgical History  Procedure Laterality Date  . Tonsilectomy, adenoidectomy, bilateral myringotomy and tubes    . Bilateral salpingoophorectomy  2002    due to fh of ovarian ca  . Abdominal hysterectomy  1985  . Colonoscopy  2012  . Breast surgery Left 02-26-13    subareloar duct excision and mass  . Excision / biopsy breast / nipple / duct Right 1985    negative  . Excision / biopsy breast / nipple / duct Left 02/26/2013    intraductal papilloma    Family History  Problem Relation Age of Onset  . Hypertension Mother   . Arthritis Mother   . Parkinson's disease Father   . Hypertension Sister   . Heart disease Daughter   . Cancer Sister 67    ovarian ca  . Cancer Brother     multiple myeloma  . Diabetes Brother   . Cancer Maternal Grandmother     colon ca  . Breast cancer Paternal Grandmother 65    Social History History  Substance Use Topics  . Smoking status: Never Smoker   . Smokeless tobacco: Never Used  . Alcohol Use: No    Allergies  Allergen Reactions  . Aloe Rash  . Aloe Vera Rash    Current Outpatient Prescriptions  Medication Sig Dispense Refill  . Bearberry, Uva-Ursi, (UVA URSI PO) Take 500 mg by mouth 3 (three) times daily.    . Cholecalciferol (VITAMIN D3) 2000 UNITS TABS Take 2,000 Units by mouth daily.    . Chromium 200 MCG TABS  Take 1 tablet by mouth 2 (two) times daily.    Marland Kitchen CINNAMON PO Take 1,000 tablets by mouth 2 (two) times daily.    . Fenugreek 610 MG CAPS Take 1 capsule by mouth daily.    . Ginger, Zingiber officinalis, (GINGER PO) 1 teaspoon by mouth daily.    Marland Kitchen HAWTHORN BERRIES PO Take 150 mg by mouth 3 (three) times daily.     Marland Kitchen OVER THE COUNTER MEDICATION Take 1 capsule by mouth daily. THYROID COMPLEX    . OVER THE COUNTER MEDICATION Take 2 capsules by mouth daily. CARDIO ADVANCED    . OVER THE COUNTER MEDICATION Take 2 capsules by mouth at bedtime. Valarian Root    . OVER THE COUNTER MEDICATION Fennel 1 teaspoon by mouth daily.    . Potassium 99 MG TABS Take 1 tablet by mouth daily.     No current facility-administered medications for this visit.    Review of Systems Review of Systems  Constitutional: Negative.   Respiratory: Negative.   Cardiovascular: Negative.     Blood pressure 132/62, pulse 68, resp. rate 12, height '5\' 4"'  (1.626 m), weight 190 lb (86.183 kg).  Physical Exam Physical Exam  Constitutional: She is oriented to person,  place, and time. She appears well-developed and well-nourished.  Eyes: Conjunctivae are normal. No scleral icterus.  Neck: Neck supple.  Cardiovascular: Normal rate, regular rhythm and normal heart sounds.   Pulmonary/Chest: Effort normal and breath sounds normal. Right breast exhibits no inverted nipple, no mass, no nipple discharge, no skin change and no tenderness. Left breast exhibits no inverted nipple, no mass, no nipple discharge, no skin change and no tenderness.  Minimal volume loss left breast.   Abdominal: Soft. Normal appearance. There is no tenderness.  Lymphadenopathy:    She has no cervical adenopathy.    She has no axillary adenopathy.  Neurological: She is alert and oriented to person, place, and time.  Skin: Skin is warm and dry.    Data Reviewed Mammogram reviewed and stable.  Assessment    Stable physical exam. History of benign  breast neoplasm    Plan    Patient will be asked to return to the office in one year with a bilateral screening mammogram.      PCP:  Deirdre Pippins 03/10/2015, 3:56 PM

## 2015-05-05 ENCOUNTER — Ambulatory Visit: Payer: BC Managed Care – PPO | Admitting: Internal Medicine

## 2015-06-15 IMAGING — MG MM DIGITAL SCREENING BILAT W/ CAD
4 series · 4 of 4 positions shown · non-contrast
Comparison: Previous exam(s).

CLINICAL DATA: Screening.

EXAM:
DIGITAL SCREENING BILATERAL MAMMOGRAM WITH CAD

[R CC]
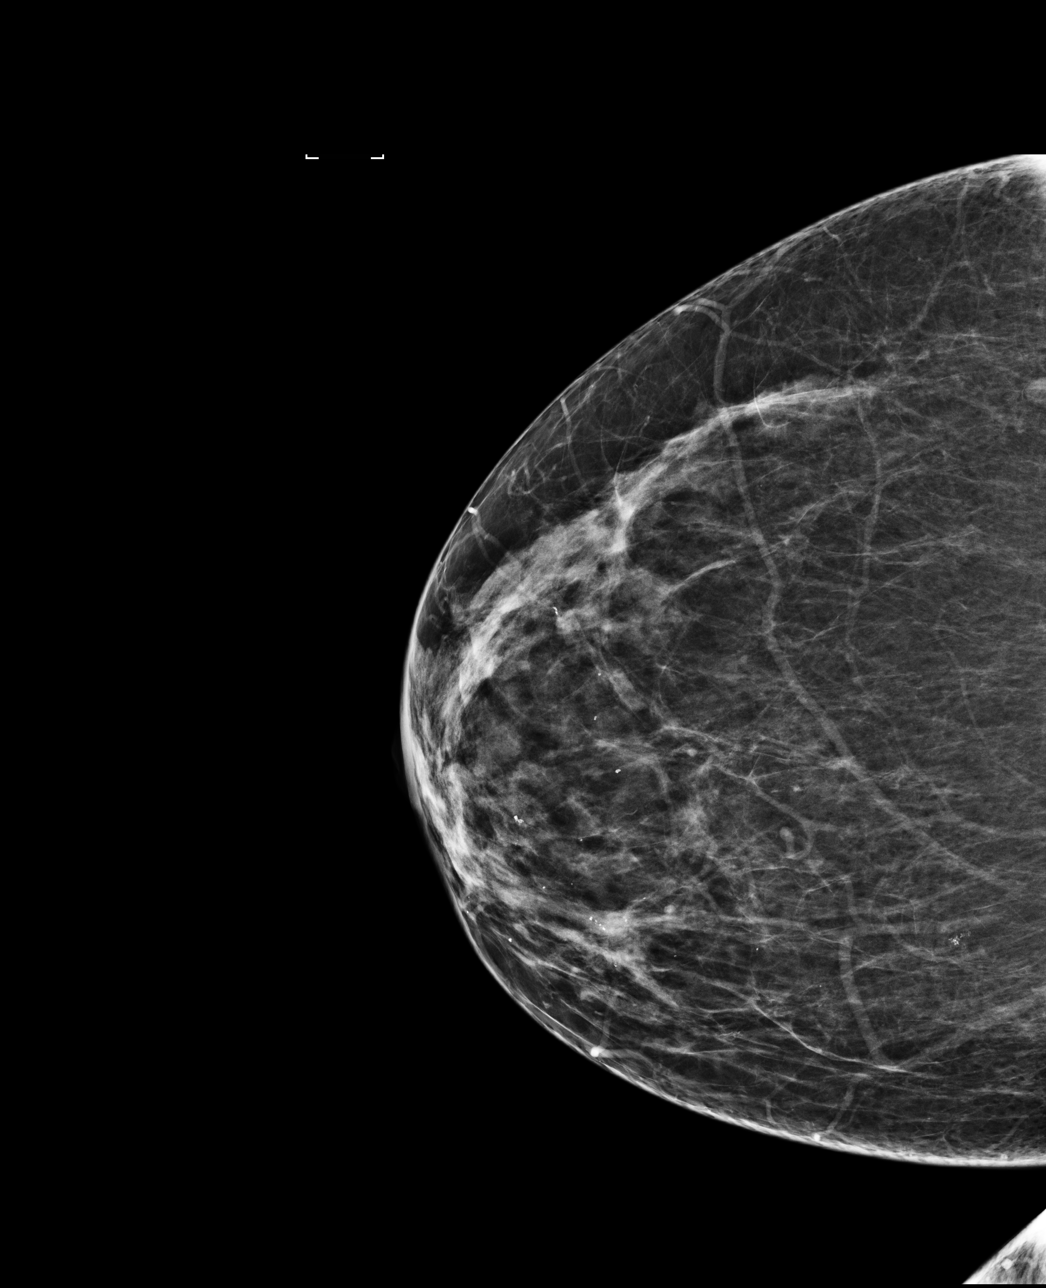

[L MLO]
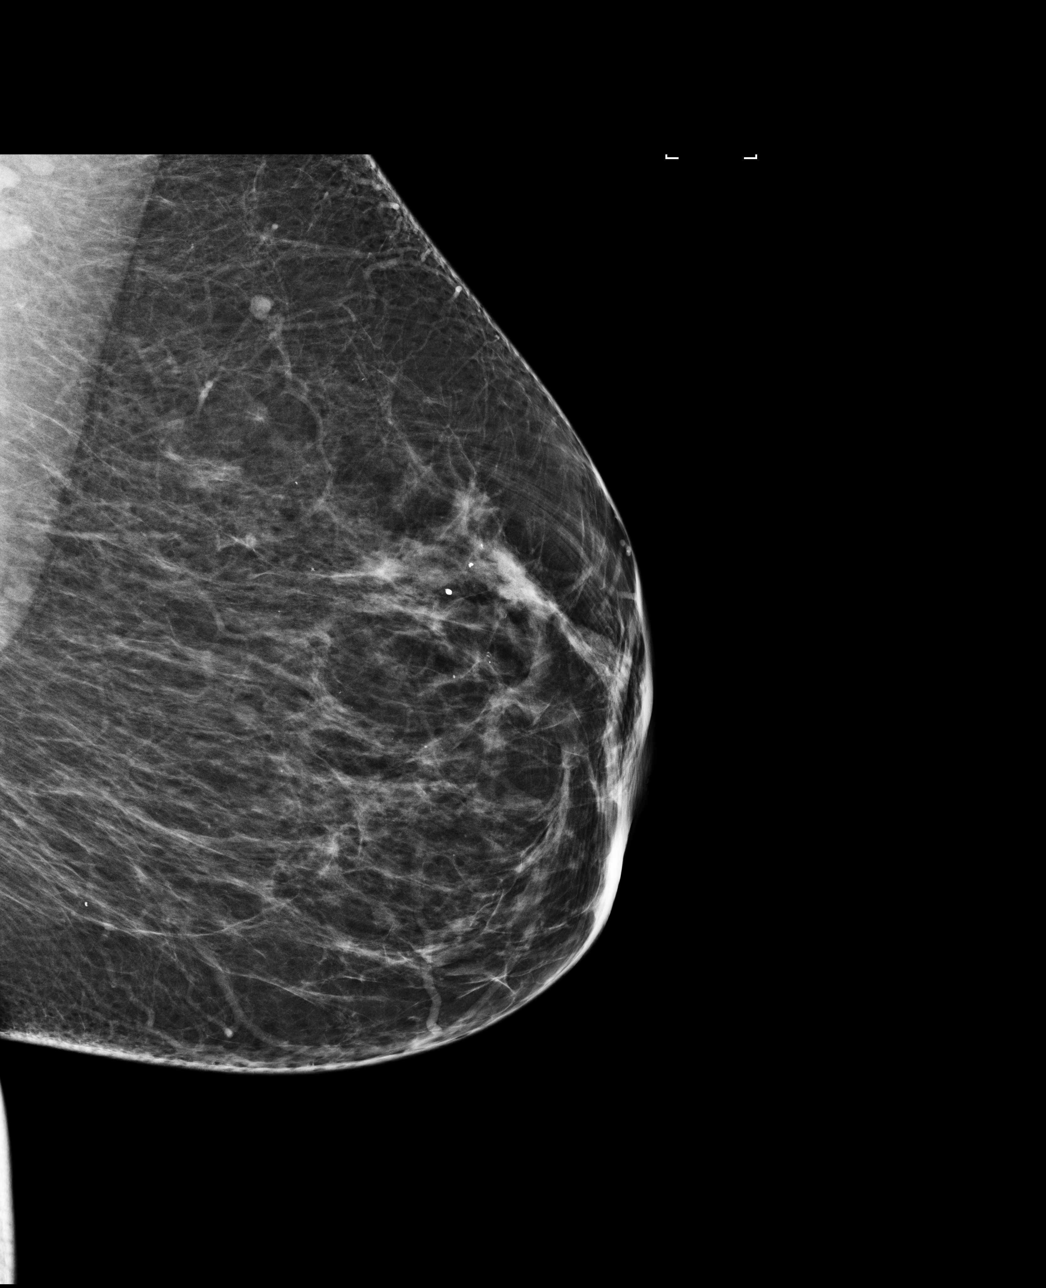

[L CC]
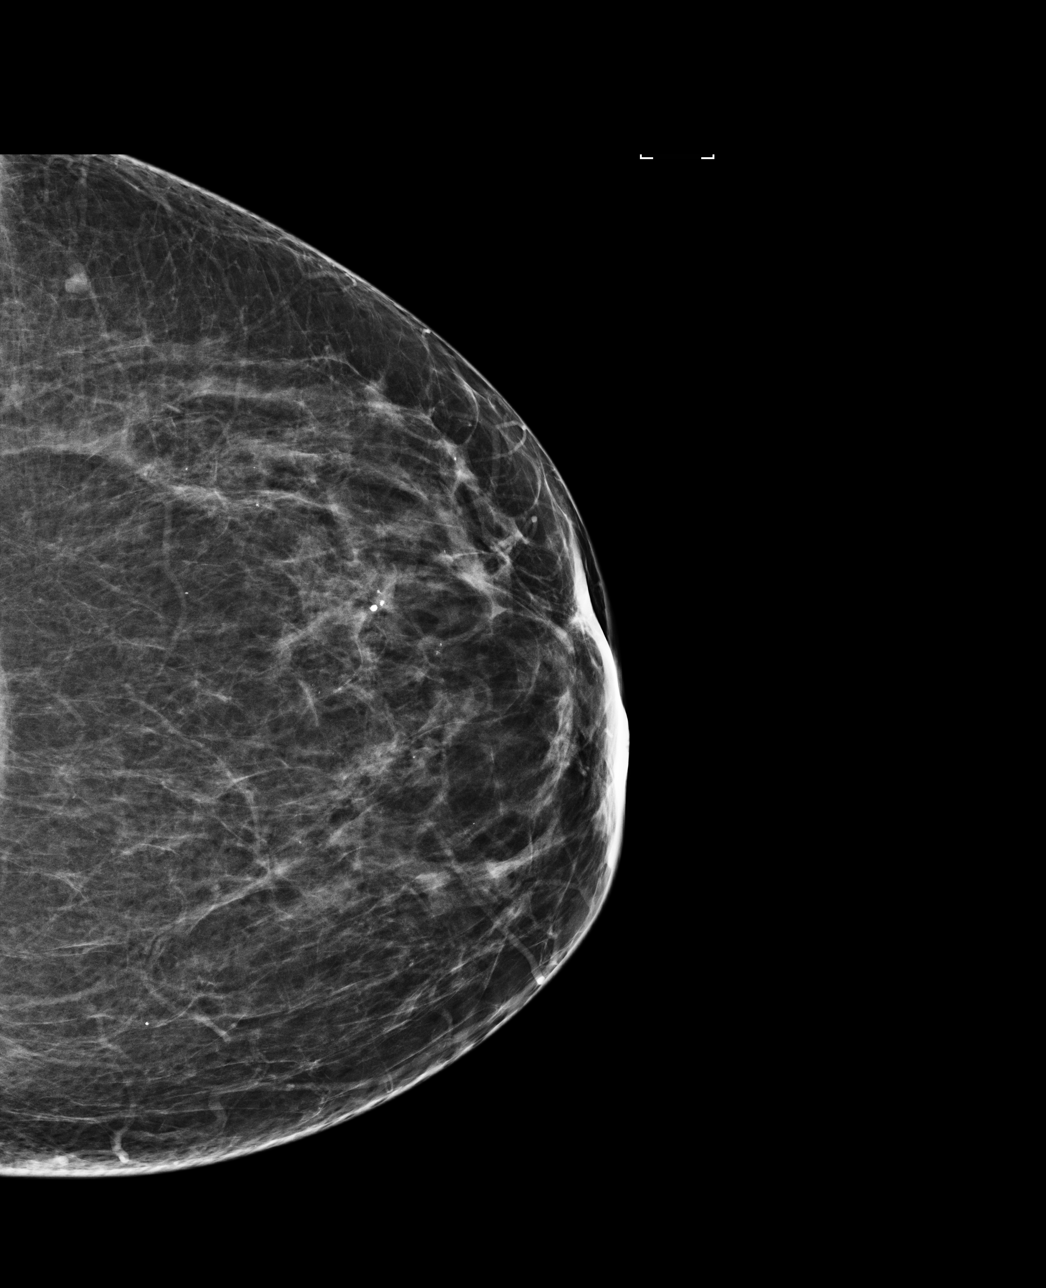

[R MLO]
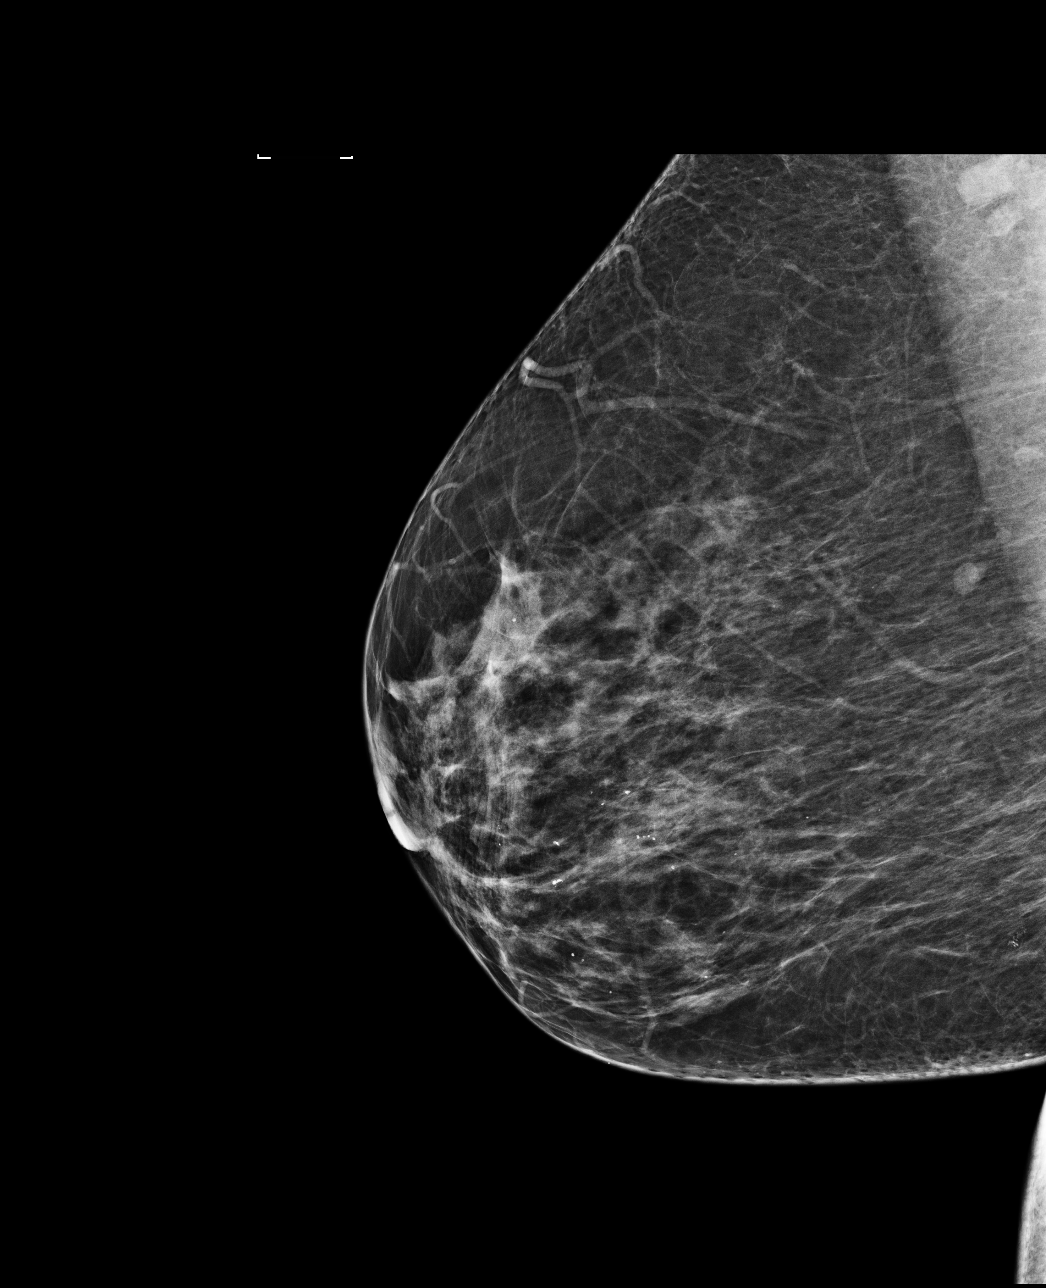

[4 of 4 positions shown; findings below may reference images not displayed]

ACR Breast Density Category b: There are scattered areas of
fibroglandular density.
FINDINGS: There are no findings suspicious for malignancy. Images were
processed with CAD.
IMPRESSION: No mammographic evidence of malignancy. A result letter of this
screening mammogram will be mailed directly to the patient.

RECOMMENDATION:
Screening mammogram in one year. (Code:AS-G-LCT)

BI-RADS CATEGORY  1: Negative.

## 2015-08-14 ENCOUNTER — Ambulatory Visit (INDEPENDENT_AMBULATORY_CARE_PROVIDER_SITE_OTHER)
Admission: RE | Admit: 2015-08-14 | Discharge: 2015-08-14 | Disposition: A | Discharge status: Home / Self Care | Payer: Medicare Other | Source: Ambulatory Visit | Attending: Internal Medicine | Admitting: Internal Medicine

## 2015-08-14 ENCOUNTER — Encounter: Payer: Self-pay | Admitting: Internal Medicine

## 2015-08-14 ENCOUNTER — Ambulatory Visit (INDEPENDENT_AMBULATORY_CARE_PROVIDER_SITE_OTHER): Payer: Medicare Other | Admitting: Internal Medicine

## 2015-08-14 VITALS — BP 148/72 | HR 72 | Temp 98.2°F | Resp 14 | Ht 64.0 in | Wt 189.2 lb

## 2015-08-14 DIAGNOSIS — K2970 Gastritis, unspecified, without bleeding: Secondary | ICD-10-CM | POA: Diagnosis not present

## 2015-08-14 DIAGNOSIS — R600 Localized edema: Secondary | ICD-10-CM | POA: Diagnosis not present

## 2015-08-14 DIAGNOSIS — R300 Dysuria: Secondary | ICD-10-CM

## 2015-08-14 DIAGNOSIS — M25472 Effusion, left ankle: Secondary | ICD-10-CM

## 2015-08-14 DIAGNOSIS — M26629 Arthralgia of temporomandibular joint, unspecified side: Secondary | ICD-10-CM

## 2015-08-14 DIAGNOSIS — N368 Other specified disorders of urethra: Secondary | ICD-10-CM

## 2015-08-14 DIAGNOSIS — K589 Irritable bowel syndrome without diarrhea: Secondary | ICD-10-CM

## 2015-08-14 LAB — COMPREHENSIVE METABOLIC PANEL
ALT: 18 U/L (ref 0–35)
AST: 20 U/L (ref 0–37)
Albumin: 4.1 g/dL (ref 3.5–5.2)
Alkaline Phosphatase: 62 U/L (ref 39–117)
BUN: 11 mg/dL (ref 6–23)
CO2: 30 mEq/L (ref 19–32)
Calcium: 9.8 mg/dL (ref 8.4–10.5)
Chloride: 103 mEq/L (ref 96–112)
Creatinine, Ser: 0.71 mg/dL (ref 0.40–1.20)
GFR: 106.16 mL/min (ref >60.00)
Glucose, Bld: 93 mg/dL (ref 70–99)
Potassium: 4 mEq/L (ref 3.5–5.1)
Sodium: 140 mEq/L (ref 135–145)
Total Bilirubin: 0.5 mg/dL (ref 0.2–1.2)
Total Protein: 7.2 g/dL (ref 6.0–8.3)

## 2015-08-14 LAB — MICROALBUMIN / CREATININE URINE RATIO
Creatinine,U: 47.8 mg/dL
Microalb Creat Ratio: 1.7 mg/g (ref 0.0–30.0)
Microalb, Ur: 0.8 mg/dL (ref 0.0–1.9)

## 2015-08-14 LAB — POCT URINALYSIS DIPSTICK
Bilirubin, UA: NEGATIVE
Blood, UA: NEGATIVE
Glucose, UA: NEGATIVE
Ketones, UA: NEGATIVE
Leukocytes, UA: NEGATIVE
Nitrite, UA: NEGATIVE
Protein, UA: NEGATIVE
Spec Grav, UA: 1.015
Urobilinogen, UA: 0.2
pH, UA: 8.5

## 2015-08-14 LAB — H. PYLORI ANTIBODY, IGG: H Pylori IgG: NEGATIVE

## 2015-08-14 MED ORDER — DICYCLOMINE HCL 10 MG PO CAPS: 10.0000 mg | ORAL_CAPSULE | Freq: Three times a day (TID) | ORAL | Status: DC

## 2015-08-14 NOTE — Progress Notes (Signed)
Pre visit review using our clinic review tool, if applicable. No additional management support is needed unless otherwise documented below in the visit note. 

## 2015-08-14 NOTE — Patient Instructions (Signed)
Please go to the [facility] office for x rays of your left ankle  You can try BenGay,  [facility],  And Salon pas patches to the tender area to see if it helps  Trial of generic Bentyl ,  An antispasmodic for the bowels,  Take 30 minutes   before meals up to 4 times daily .  Maximum dose is 20 mg before each meal  You may also want to try incorporating probiotics into your diet with an organic drink called "Suja"  Available at most grocery stores in the vegetable section  Keffir made from almond milk is also a good source  If your symptoms don't improve with the medication,  Call and we will schedule the alpha gal test

## 2015-08-14 NOTE — Progress Notes (Signed)
Subjective:  Patient ID: Kari Lyons, female    DOB: [birth year]  Age: 64 y.o. MRN: [id number]  CC: The primary encounter diagnosis was Burning with urination. Diagnoses of Gastritis, Bilateral edema of lower extremity, Urethral bleeding, IBS (irritable bowel syndrome), Left ankle swelling, TMJ tenderness, and Dysuria were also pertinent to this visit.  HPI Kari Lyons presents for evaluation of multiple issues today,  Last seen in April,  Did not return in July for annual exam .  Has been having dysuria and bilateral kidney pain ,  Dysuria x 1 week.    Has chronic back pain due to DDD so is careful about not lifitng anythin g heavy  Lower extremity edema confined to the ankles,  Pain in lateral ankle on the left,  has seen podiatry for right ankle pain .  Cannot take turmeric due to gastritis  IBS much worse,  Has increased reflux esophagitis and constipation , her abd pain is worse with eating fish and red meat.  Wants H  Pylori Ab test,  Alpha gal  Test. .Previously prescribed  librax for spasms. prescribed by Dr Vira Agar but her pharamacist  Advised her to stop it so she stopped it.  Has a lot of bloating ,  Feels miserable. .  Not taking a probiotic.  ,  takign digestive enzymes from the health food store, helps  .    Right side of neck feels swollen, wakes up with right side of jaw is tender when she wakes up.  Grinds teeth .  Does not wear a mouth guard,   Has not seen urogyn for urethral complaints.  Had her Mammogram in May,  folllowd by sankar for benign  Calcifications.    Outpatient Prescriptions Prior to Visit  Medication Sig Dispense Refill  . Bearberry, Uva-Ursi, (UVA URSI PO) Take 500 mg by mouth 3 (three) times daily.    . Cholecalciferol (VITAMIN D3) 2000 UNITS TABS Take 2,000 Units by mouth daily.    . Chromium 200 MCG TABS Take 1 tablet by mouth 2 (two) times daily.    Marland Kitchen CINNAMON PO Take 1,000 tablets by mouth 2 (two) times daily.    . Fenugreek 610 MG CAPS  Take 1 capsule by mouth daily.    . Ginger, Zingiber officinalis, (GINGER PO) 1 teaspoon by mouth daily.    Marland Kitchen HAWTHORN BERRIES PO Take 150 mg by mouth 3 (three) times daily.     Marland Kitchen OVER THE COUNTER MEDICATION Take 2 capsules by mouth at bedtime. Valarian Root    . OVER THE COUNTER MEDICATION Fennel 1 teaspoon by mouth daily.    . Potassium 99 MG TABS Take 1 tablet by mouth daily.    Marland Kitchen OVER THE COUNTER MEDICATION Take 1 capsule by mouth daily. THYROID COMPLEX    . OVER THE COUNTER MEDICATION Take 2 capsules by mouth daily. CARDIO ADVANCED     No facility-administered medications prior to visit.    Review of Systems;  Patient denies headache, fevers, malaise, unintentional weight loss, skin rash, eye pain, sinus congestion and sinus pain, sore throat, dysphagia,  hemoptysis , cough, dyspnea, wheezing, chest pain, palpitations, orthopnea, edema, abdominal pain, nausea, melena, diarrhea, constipation, flank pain, dysuria, hematuria, urinary  Frequency, nocturia, numbness, tingling, seizures,  Focal weakness, Loss of consciousness,  Tremor, insomnia, depression, anxiety, and suicidal ideation.      Objective:  BP 148/72 mmHg  Pulse 72  Temp(Src) 98.2 F (36.8 C) (Oral)  Resp 14  Ht _0  (  1.626 m)  Wt 189 lb 3.2 oz (85.821 kg)  BMI 32.46 kg/m2  SpO2 97%  BP Readings from Last 3 Encounters:  08/14/15 148/72  03/10/15 132/62  01/16/15 140/84    Wt Readings from Last 3 Encounters:  08/14/15 189 lb 3.2 oz (85.821 kg)  03/10/15 190 lb (86.183 kg)  01/16/15 190 lb (86.183 kg)    General appearance: alert, cooperative and appears stated age Ears: normal TM's and external ear canals both ears Throat: lips, mucosa, and tongue normal; teeth and gums normal Neck: no adenopathy, no carotid bruit, supple, symmetrical, trachea midline and thyroid not enlarged, symmetric, no tenderness/mass/nodules Back: symmetric, no curvature. ROM normal. No CVA tenderness. Lungs: clear to auscultation  bilaterally Heart: regular rate and rhythm, S1, S2 normal, no murmur, click, rub or gallop Abdomen: soft, non-tender; bowel sounds normal; no masses,  no organomegaly Pulses: 2+ and symmetric Skin: Skin color, texture, turgor normal. No rashes or lesions Lymph nodes: Cervical, supraclavicular, and axillary nodes normal.  No results found for: HGBA1C  Lab Results  Component Value Date   CREATININE 0.71 08/14/2015   CREATININE 0.75 01/16/2015   CREATININE 0.8 02/22/2013    Lab Results  Component Value Date   WBC 4.7 01/16/2015   HGB 12.7 01/16/2015   HCT 38.3 01/16/2015   PLT 220.0 01/16/2015   GLUCOSE 93 08/14/2015   CHOL 143 01/16/2015   TRIG 96.0 01/16/2015   HDL 50.50 01/16/2015   LDLCALC 73 01/16/2015   ALT 18 08/14/2015   AST 20 08/14/2015   NA 140 08/14/2015   K 4.0 08/14/2015   CL 103 08/14/2015   CREATININE 0.71 08/14/2015   BUN 11 08/14/2015   CO2 30 08/14/2015   TSH 1.77 01/16/2015   MICROALBUR 0.8 08/14/2015    Mm Digital Screening Bilateral  02/24/2015  CLINICAL DATA:  Screening. EXAM: DIGITAL SCREENING BILATERAL MAMMOGRAM WITH CAD COMPARISON:  Previous exam(s). ACR Breast Density Category b: There are scattered areas of fibroglandular density. FINDINGS: There are no findings suspicious for malignancy. Images were processed with CAD. IMPRESSION: No mammographic evidence of malignancy. A result letter of this screening mammogram will be mailed directly to the patient. RECOMMENDATION: Screening mammogram in one year. (Code:SM-B-01Y) BI-RADS CATEGORY  1: Negative. Electronically Signed   By: Conchita Paris M.D.   On: 02/24/2015 09:45    Assessment & Plan:   Problem List Items Addressed This Visit    Urethral bleeding    Prior UA was normal.  She  Denies current symptoms and does not want referral      IBS (irritable bowel syndrome)    Trial of bentyl and test for H Pylori.  Alpha gal testing deferred. Discussed use of probiotics.        Relevant  Medications   dicyclomine (BENTYL) 10 MG capsule   Left ankle swelling    Plain films are normal today,.  Swelling is minimal and likely VI.Marland Kitchen        TMJ tenderness    Right side.  Due to untreated bruxism..  No neck swelling on exam.       Dysuria    UA is  Normal today.        Other Visit Diagnoses    Burning with urination    -  Primary    Relevant Orders    POCT urinalysis dipstick (Completed)    DG Ankle Complete Left (Completed)    Gastritis        Relevant Orders  H. pylori antibody, IgG (Completed)    Bilateral edema of lower extremity        Relevant Orders    Comp Met (CMET) (Completed)    Microalbumin / creatinine urine ratio (Completed)      A total of 40 minutes was spent with patient more than half of which was spent in counseling patient on the above mentioned issues , reviewing and explaining recent labs and imaging studies done, and coordination of care.  I am having Ms. Weppler start on dicyclomine. I am also having her maintain her HAWTHORN BERRIES PO, (Bearberry, Uva-Ursi, (UVA URSI PO)), Vitamin D3, Fenugreek, OVER THE COUNTER MEDICATION, OVER THE COUNTER MEDICATION, Potassium, CINNAMON PO, Chromium, OVER THE COUNTER MEDICATION, (Ginger, Zingiber officinalis, (GINGER PO)), and OVER THE COUNTER MEDICATION.  Meds ordered this encounter  Medications  . dicyclomine (BENTYL) 10 MG capsule    Sig: Take 1 capsule (10 mg total) by mouth 4 (four) times daily -  before meals and at bedtime.    Dispense:  120 capsule    Refill:  2    There are no discontinued medications.  Follow-up: No Follow-up on file.   [name], MD

## 2015-08-16 DIAGNOSIS — K589 Irritable bowel syndrome without diarrhea: Secondary | ICD-10-CM | POA: Insufficient documentation

## 2015-08-16 DIAGNOSIS — M26629 Arthralgia of temporomandibular joint, unspecified side: Secondary | ICD-10-CM | POA: Insufficient documentation

## 2015-08-16 DIAGNOSIS — R300 Dysuria: Secondary | ICD-10-CM | POA: Insufficient documentation

## 2015-08-16 DIAGNOSIS — M25472 Effusion, left ankle: Secondary | ICD-10-CM | POA: Insufficient documentation

## 2015-08-16 NOTE — Assessment & Plan Note (Signed)
Right side.  Due to untreated bruxism..  No neck swelling on exam.

## 2015-08-16 NOTE — Assessment & Plan Note (Signed)
Prior UA was normal.  She  Denies current symptoms and does not want referral

## 2015-08-16 NOTE — Assessment & Plan Note (Signed)
UA is  Normal today.

## 2015-08-16 NOTE — Assessment & Plan Note (Addendum)
Trial of bentyl and test for H Pylori.  Alpha gal testing deferred. Discussed use of probiotics.

## 2015-08-16 NOTE — Assessment & Plan Note (Signed)
Plain films are normal today,.  Swelling is minimal and likely VI.Marland Kitchen

## 2015-08-17 ENCOUNTER — Encounter: Payer: Self-pay | Admitting: *Deleted

## 2015-10-05 ENCOUNTER — Telehealth: Payer: Self-pay | Admitting: *Deleted

## 2015-10-05 DIAGNOSIS — R3989 Other symptoms and signs involving the genitourinary system: Secondary | ICD-10-CM

## 2015-10-05 NOTE — Telephone Encounter (Signed)
Patient stated that she had a discussion with Dr. Derrel Nip about seeing a neurologist. Patient would like a suggestion from Dr. Derrel Nip of a neurologist , that could se her. Please Advise

## 2015-10-05 NOTE — Telephone Encounter (Signed)
Please advise 

## 2015-10-06 NOTE — Telephone Encounter (Signed)
Urologist,  Not neurologist.  Referral is in process , BUT:   In the future,  When a patient calls requesting a referral:   1) Staff should be made  aware of this common misunderstanding (urology vs neurology) and  take 5 extra seconds to  clarify which one referral the  patient is requesting ,  As well as the  reason for the referral .  This will save the provider at least 5 minutes  reviewing prior notes

## 2015-10-06 NOTE — Telephone Encounter (Signed)
Can you please advise patient when referral is completed.  thanks

## 2015-10-13 ENCOUNTER — Ambulatory Visit (INDEPENDENT_AMBULATORY_CARE_PROVIDER_SITE_OTHER): Payer: Medicare Other | Admitting: Urology

## 2015-10-13 ENCOUNTER — Encounter: Payer: Self-pay | Admitting: Urology

## 2015-10-13 VITALS — BP 186/83 | HR 78 | Ht 64.0 in | Wt 193.7 lb

## 2015-10-13 LAB — URINALYSIS, COMPLETE
Bilirubin, UA: NEGATIVE
Glucose, UA: NEGATIVE
Ketones, UA: NEGATIVE
Leukocytes, UA: NEGATIVE
Nitrite, UA: NEGATIVE
Protein, UA: NEGATIVE
Specific Gravity, UA: <1.005 — ABNORMAL LOW (ref 1.005–1.030)
Urobilinogen, Ur: 0.2 mg/dL (ref 0.2–1.0)
pH, UA: 7 (ref 5.0–7.5)

## 2015-10-13 LAB — MICROSCOPIC EXAMINATION
Bacteria, UA: NONE SEEN
Epithelial Cells (non renal): NONE SEEN /hpf (ref 0–10)
RBC, UA: NONE SEEN /hpf (ref 0–?)
WBC, UA: NONE SEEN /hpf (ref 0–?)

## 2015-10-13 LAB — BLADDER SCAN AMB NON-IMAGING

## 2015-10-16 ENCOUNTER — Ambulatory Visit (INDEPENDENT_AMBULATORY_CARE_PROVIDER_SITE_OTHER): Payer: Medicare Other | Admitting: Urology

## 2015-10-16 ENCOUNTER — Encounter: Payer: Self-pay | Admitting: Urology

## 2015-10-16 VITALS — BP 140/79 | HR 76 | Ht 65.0 in | Wt 194.0 lb

## 2015-10-16 DIAGNOSIS — N8111 Cystocele, midline: Secondary | ICD-10-CM

## 2015-10-16 DIAGNOSIS — R339 Retention of urine, unspecified: Secondary | ICD-10-CM

## 2015-10-16 LAB — BLADDER SCAN AMB NON-IMAGING: Scan Result: 23

## 2015-10-16 NOTE — Progress Notes (Signed)
10/16/2015 3:22 PM   Kari Lyons Kari Lyons August 29, 1951 [id number]  Referring provider: [name], MD [address] [place], [place] [address]  Chief Complaint  Patient presents with  . New Patient (Initial Visit)    HPI: The patient is a 64 year old female who presents for evaluation of her bladder problems. She reports that she has urinary frequency every half hour during the day, difficulty urinating, pain with urination, nocturia 1-2, intermittency, hesitancy, straining, and weak stream. She also notes that her stream "splatters everywhere."  She denies incontinence. This is been going on for some time and is worsening over the last few months. She denies any urinary tract infections or hematuria before. She does have a history of an abdominal hysterectomy. Her gynecologist told her in the past over that "her intestines and bladder were falling out" and that it may cause her a problem down the road. Her postvoid residual today is 25. Her UA was negative from a few days ago.   PMH: Past Medical History  Diagnosis Date  . IBS (irritable bowel syndrome)   . GERD (gastroesophageal reflux disease)   . Depression   . Hypertension   . Edema   . Arrhythmia 2008  . Mitral valve prolapse   . 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 2009: Reported workup at [facility] including ECHO, Stress Test and Cardiac Cath; records N/A.  ECHO with mild LVH, mild MVP; stress test and cath reportedly normal     Surgical History: Past Surgical History  Procedure Laterality Date  . Tonsilectomy, adenoidectomy, bilateral myringotomy and tubes    . Bilateral salpingoophorectomy  2002    due to fh of ovarian ca  . Abdominal hysterectomy  1985  . Colonoscopy  2012  . Breast surgery Left 02-26-13    subareloar duct excision and mass  . Excision / biopsy breast / nipple / duct Right 1985    negative  . Excision / biopsy breast / nipple / duct Left 02/26/2013    intraductal  papilloma    Home Medications:    Medication List       This list is accurate as of: 10/16/15  3:22 PM.  Always use your most recent med list.               Chromium 200 MCG Tabs  Take 1 tablet by mouth 2 (two) times daily.     CINNAMON PO  Take 1,000 tablets by mouth 2 (two) times daily.     Fenugreek 610 MG Caps  Take 1 capsule by mouth daily. Reported on 10/16/2015     GINGER PO  1 teaspoon by mouth daily.     HAWTHORN BERRIES PO  Take 150 mg by mouth 3 (three) times daily.     Olive Leaf Extract 250 MG Caps  Take by mouth.     OVER THE COUNTER MEDICATION  Take 2 capsules by mouth at bedtime. Valarian Root     OVER THE COUNTER MEDICATION  Fennel 1 teaspoon by mouth daily.     Potassium 99 MG Tabs  Take 1 tablet by mouth daily.     UVA URSI PO  Take 500 mg by mouth 3 (three) times daily. Reported on 10/16/2015     vitamin C 100 MG tablet  Take 100 mg by mouth daily.     Vitamin D3 2000 units Tabs  Take 2,000 Units by mouth daily.        Allergies:  Allergies  Allergen Reactions  . Aloe Rash  . Aloe Vera Rash    Family History: Family History  Problem Relation Age of Onset  . Hypertension Mother   . Arthritis Mother   . Parkinson's disease Father   . Hypertension Sister   . Heart disease Daughter   . Cancer Sister 40    ovarian ca  . Cancer Brother     multiple myeloma  . Diabetes Brother   . Cancer Maternal Grandmother     colon ca  . Breast cancer Paternal Grandmother 53  . Bladder Cancer Paternal Uncle   . Prostate cancer Neg Hx   . Kidney cancer Neg Hx     Social History:  reports that she has quit smoking. She has never used smokeless tobacco. She reports that she does not drink alcohol or use illicit drugs.  ROS: UROLOGY Frequent Urination?: Yes Hard to postpone urination?: No Burning/pain with urination?: Yes Get up at night to urinate?: Yes Leakage of urine?: No Urine stream starts and stops?: Yes Trouble starting  stream?: Yes Do you have to strain to urinate?: Yes Blood in urine?: No Urinary tract infection?: No Sexually transmitted disease?: No Injury to kidneys or bladder?: No Painful intercourse?: No Weak stream?: Yes Currently pregnant?: No Vaginal bleeding?: No Last menstrual period?: n  Gastrointestinal Nausea?: No Vomiting?: No Indigestion/heartburn?: Yes Diarrhea?: No Constipation?: Yes  Constitutional Fever: No Night sweats?: Yes Weight loss?: No Fatigue?: Yes  Skin Skin rash/lesions?: No Itching?: No  Eyes Blurred vision?: No Double vision?: No  Ears/Nose/Throat Sore throat?: No Sinus problems?: No  Hematologic/Lymphatic Swollen glands?: Yes Easy bruising?: No  Cardiovascular Leg swelling?: Yes Chest pain?: No  Respiratory Cough?: No Shortness of breath?: Yes  Endocrine Excessive thirst?: No  Musculoskeletal Back pain?: Yes Joint pain?: Yes  Neurological Headaches?: Yes Dizziness?: No  Psychologic Depression?: Yes Anxiety?: Yes  Physical Exam: BP 140/79 mmHg  Pulse 76  Ht '5\' 5"'$  (1.651 m)  Wt 194 lb (87.998 kg)  BMI 32.28 kg/m2  Constitutional:  Alert and oriented, No acute distress. HEENT: [place] AT, moist mucus membranes.  Trachea midline, no masses. Cardiovascular: No clubbing, cyanosis, or edema. Respiratory: Normal respiratory effort, no increased work of breathing. GI: Abdomen is soft, nontender, nondistended, no abdominal masses GU: No CVA tenderness. Vaginal exam reveals a cystocele to the level of the hymen. No rectocele. No incontinence on coughing or straining. Normal external genitalia (chaperoned by Mare Ferrari) Skin: No rashes, bruises or suspicious lesions. Lymph: No cervical or inguinal adenopathy. Neurologic: Grossly intact, no focal deficits, moving all 4 extremities. Psychiatric: Normal mood and affect.  Laboratory Data: Lab Results  Component Value Date   WBC 4.7 01/16/2015   HGB 12.7 01/16/2015   HCT 38.3 01/16/2015    MCV 85.0 01/16/2015   PLT 220.0 01/16/2015    Lab Results  Component Value Date   CREATININE 0.71 08/14/2015    No results found for: PSA  No results found for: TESTOSTERONE  No results found for: HGBA1C  Urinalysis    Component Value Date/Time   COLORURINE YELLOW 01/16/2015 1105   APPEARANCEUR CLEAR 01/16/2015 1105   LABSPEC <=1.005* 01/16/2015 1105   PHURINE 7.5 01/16/2015 1105   GLUCOSEU Negative 10/13/2015 [place] 01/16/2015 1105   HGBUR NEGATIVE 01/16/2015 1105   BILIRUBINUR Negative 10/13/2015 0834   BILIRUBINUR neg 08/14/2015 1403   [place] 01/16/2015 [place] 01/16/2015 1105   PROTEINUR neg 08/14/2015 1403   UROBILINOGEN  0.2 08/14/2015 1403   UROBILINOGEN 0.2 01/16/2015 1105   NITRITE Negative 10/13/2015 0834   NITRITE neg 08/14/2015 1403   NITRITE NEGATIVE 01/16/2015 1105   LEUKOCYTESUR Negative 10/13/2015 0834   LEUKOCYTESUR Negative 08/14/2015 1403   PVR: 25 cc  Assessment & Plan:    1. 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 I also discussed that her urinary symptoms are likely from kinking of her urethra due to her cystocele. I have arranged for her to see my partner, Dr. Matilde Sprang, who specializes in this procedure. She will see him when he is in the [place] office in the near future.   Return for with Dr. Matilde Sprang.  Nickie Retort, MD  [facility] [address], [place], [place] [address] [phone]

## 2015-10-16 NOTE — Progress Notes (Signed)
Bladder Scan Patient void:23 ml Performed By: Larna Daughters

## 2015-10-30 ENCOUNTER — Ambulatory Visit (INDEPENDENT_AMBULATORY_CARE_PROVIDER_SITE_OTHER): Payer: Medicare Other | Admitting: Urology

## 2015-10-30 ENCOUNTER — Encounter: Payer: Self-pay | Admitting: Urology

## 2015-10-30 VITALS — BP 181/91 | HR 72 | Ht 65.0 in | Wt 195.1 lb

## 2015-10-30 DIAGNOSIS — R339 Retention of urine, unspecified: Secondary | ICD-10-CM | POA: Diagnosis not present

## 2015-10-30 LAB — URINALYSIS, COMPLETE
Bilirubin, UA: NEGATIVE
Glucose, UA: NEGATIVE
Ketones, UA: NEGATIVE
Leukocytes, UA: NEGATIVE
Nitrite, UA: NEGATIVE
Protein, UA: NEGATIVE
Specific Gravity, UA: 1.01 (ref 1.005–1.030)
Urobilinogen, Ur: 0.2 mg/dL (ref 0.2–1.0)
pH, UA: 8.5 — ABNORMAL HIGH (ref 5.0–7.5)

## 2015-10-30 LAB — MICROSCOPIC EXAMINATION
Bacteria, UA: NONE SEEN
Epithelial Cells (non renal): NONE SEEN /hpf (ref 0–10)
RBC, UA: NONE SEEN /hpf (ref 0–?)
WBC, UA: NONE SEEN /hpf (ref 0–?)

## 2015-10-30 MED ORDER — CIPROFLOXACIN HCL 500 MG PO TABS
500.0000 mg | ORAL_TABLET | Freq: Once | ORAL | Status: AC
  Administered 2015-10-30: 500 mg via ORAL

## 2015-10-30 NOTE — Progress Notes (Signed)
10/30/2015 3:03 PM   Kari Lyons [birth year] [id number]  Referring provider: [name], MD [place], [place] [address]  Chief Complaint  Patient presents with  . Cysto    HPI: Dr B: The patient is a 65 year old female who presents for evaluation of her bladder problems. She reports that she has urinary frequency every half hour during the day, difficulty urinating, pain with urination, nocturia 1-2, intermittency, hesitancy, straining, and weak stream. She also notes that her stream "splatters everywhere." She denies incontinence. This is been going on for some time and is worsening over the last few months. She denies any urinary tract infections or hematuria before. She does have a history of an abdominal hysterectomy. Her gynecologist told her in the past over that "her intestines and bladder were falling out" and that it may cause her a problem down the road. Her postvoid residual today is 25. Her UA was negative from a few days ago.  The patient is here for an identified cystocele   the patient primarily complains of flow symptoms. She'll feel the urge to urinate and will sit and hesitate. She said the flow when it comes out is reasonable at best. The flow symptoms as her primary complaint. She does not feel the cystocele. She says it also sprays.    she is continent. She is minimal nocturia and frequency    PMH: Past Medical History  Diagnosis Date  . IBS (irritable bowel syndrome)   . GERD (gastroesophageal reflux disease)   . Depression   . Hypertension   . Edema   . Arrhythmia 2008  . Mitral valve prolapse   . 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 2009: Reported workup at [facility] including ECHO, Stress Test and Cardiac Cath; records N/A.  ECHO with mild LVH, mild MVP; stress test and cath reportedly normal   . Patient is Jehovah's Witness     Surgical History: Past Surgical History  Procedure Laterality Date  .  Tonsilectomy, adenoidectomy, bilateral myringotomy and tubes    . Bilateral salpingoophorectomy  2002    due to fh of ovarian ca  . Abdominal hysterectomy  1985  . Colonoscopy  2012  . Breast surgery Left 02-26-13    subareloar duct excision and mass  . Excision / biopsy breast / nipple / duct Right 1985    negative  . Excision / biopsy breast / nipple / duct Left 02/26/2013    intraductal papilloma    Home Medications:    Medication List       This list is accurate as of: 10/30/15  3:03 PM.  Always use your most recent med list.               Chromium 200 MCG Tabs  Take 1 tablet by mouth 2 (two) times daily.     CINNAMON PO  Take 1,000 tablets by mouth 2 (two) times daily.     Fenugreek 610 MG Caps  Take 1 capsule by mouth daily. Reported on 10/30/2015     GINGER PO  Reported on 10/30/2015     HAWTHORN BERRIES PO  Take 150 mg by mouth 3 (three) times daily.     Olive Leaf Extract 250 MG Caps  Take by mouth.     OVER THE COUNTER MEDICATION  Take 2 capsules by mouth at bedtime. Valarian Root     OVER THE COUNTER MEDICATION  Fennel 1 teaspoon by mouth daily.  Potassium 99 MG Tabs  Take 1 tablet by mouth daily.     UVA URSI PO  Take 500 mg by mouth 3 (three) times daily. Reported on 10/30/2015     vitamin C 100 MG tablet  Take 100 mg by mouth daily.     Vitamin D3 2000 units Tabs  Take 2,000 Units by mouth daily.        Allergies:  Allergies  Allergen Reactions  . Aloe Rash  . Aloe Vera Rash    Family History: Family History  Problem Relation Age of Onset  . Hypertension Mother   . Arthritis Mother   . Parkinson's disease Father   . Hypertension Sister   . Heart disease Daughter   . Cancer Sister 8    ovarian ca  . Cancer Brother     multiple myeloma  . Diabetes Brother   . Cancer Maternal Grandmother     colon ca  . Breast cancer Paternal Grandmother 28  . Bladder Cancer Paternal Uncle   . Prostate cancer Neg Hx   . Kidney cancer Neg  Hx     Social History:  reports that she has quit smoking. She has never used smokeless tobacco. She reports that she does not drink alcohol or use illicit drugs.  ROS: UROLOGY Frequent Urination?: Yes Hard to postpone urination?: No Burning/pain with urination?: No Get up at night to urinate?: Yes Leakage of urine?: No Urine stream starts and stops?: Yes Trouble starting stream?: Yes Do you have to strain to urinate?: Yes Blood in urine?: No Urinary tract infection?: No Sexually transmitted disease?: No Injury to kidneys or bladder?: No Painful intercourse?: No Weak stream?: No Currently pregnant?: Yes Vaginal bleeding?: No Last menstrual period?: n  Gastrointestinal Nausea?: No Vomiting?: No Indigestion/heartburn?: Yes Diarrhea?: No Constipation?: Yes  Constitutional Fever: No Night sweats?: Yes Weight loss?: No Fatigue?: Yes  Skin Skin rash/lesions?: No Itching?: No  Eyes Blurred vision?: No Double vision?: No  Ears/Nose/Throat Sore throat?: No Sinus problems?: No  Hematologic/Lymphatic Swollen glands?: No Easy bruising?: No  Cardiovascular Leg swelling?: Yes Chest pain?: No  Respiratory Cough?: No Shortness of breath?: Yes  Endocrine Excessive thirst?: No  Musculoskeletal Back pain?: Yes Joint pain?: Yes  Neurological Headaches?: Yes Dizziness?: No  Psychologic Depression?: Yes Anxiety?: Yes  Physical Exam: BP 181/91 mmHg  Pulse 72  Ht '5\' 5"'  (1.651 m)  Wt 88.497 kg (195 lb 1.6 oz)  BMI 32.47 kg/m2  Constitutional:  Alert and oriented, No acute distress. HEENT: [place] AT, moist mucus membranes.  Trachea midline, no masses. Cardiovascular: No clubbing, cyanosis, or edema. Respiratory: Normal respiratory effort, no increased work of breathing. GI: Abdomen is soft, nontender, nondistended, no abdominal masses GU:  The patient has a grade 2 cystocele that at rest was quite high in the vaginal vault. She did have some anterior vaginal  wall hypermobility with some elasticity of tissues. Skin: No rashes, bruises or suspicious lesions. Lymph: No cervical or inguinal adenopathy. Neurologic: Grossly intact, no focal deficits, moving all 4 extremities. Psychiatric: Normal mood and affect.  Laboratory Data: Lab Results  Component Value Date   WBC 4.7 01/16/2015   HGB 12.7 01/16/2015   HCT 38.3 01/16/2015   MCV 85.0 01/16/2015   PLT 220.0 01/16/2015    Lab Results  Component Value Date   CREATININE 0.71 08/14/2015     Urinalysis    Component Value Date/Time   COLORURINE YELLOW 01/16/2015 1105   APPEARANCEUR CLEAR 01/16/2015 1105   LABSPEC <=  1.005* 01/16/2015 1105   PHURINE 7.5 01/16/2015 1105   GLUCOSEU Negative 10/13/2015 0834   GLUCOSEU NEGATIVE 01/16/2015 1105   HGBUR NEGATIVE 01/16/2015 1105   BILIRUBINUR Negative 10/13/2015 0834   BILIRUBINUR neg 08/14/2015 [place] 01/16/2015 1105   KETONESUR NEGATIVE 01/16/2015 1105   PROTEINUR neg 08/14/2015 1403   UROBILINOGEN 0.2 08/14/2015 1403   UROBILINOGEN 0.2 01/16/2015 1105   NITRITE Negative 10/13/2015 0834   NITRITE neg 08/14/2015 1403   NITRITE NEGATIVE 01/16/2015 1105   LEUKOCYTESUR Negative 10/13/2015 0834   LEUKOCYTESUR Negative 08/14/2015 1403    Pertinent Imaging: None   cystoscopy: utilizing sterile technique and after consent the patient underwent cystoscopy. Bladder mucosa and trigone were normal. There is no cystitis. There is no foreign body. Urethral axis was normal  Assessment & Plan:  The patient has moderately significant flow symptoms. Pathophysiology poor flow discussed. 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 I recommended a trial with an alpha-blocker. Physical therapy may play a role. It is highly unlikely that her cystocele is the cause of her symptoms. She does not have a hinge effect and even though she has rotational descent of her urethra and should not cause breaking  See on Rapaflo samples in  1 month  1. Incomplete bladder emptying 2. Cystocele 3. Weak stream    Reece Packer, MD  [facility] [address] [place],  [address] [phone]

## 2015-11-27 ENCOUNTER — Ambulatory Visit: Payer: Medicare Other

## 2015-12-15 ENCOUNTER — Other Ambulatory Visit: Payer: Self-pay | Admitting: *Deleted

## 2015-12-15 DIAGNOSIS — Z1231 Encounter for screening mammogram for malignant neoplasm of breast: Secondary | ICD-10-CM

## 2015-12-29 ENCOUNTER — Encounter: Payer: Self-pay | Admitting: *Deleted

## 2016-01-26 ENCOUNTER — Ambulatory Visit: Payer: Medicare Other

## 2016-02-26 ENCOUNTER — Ambulatory Visit
Admission: RE | Admit: 2016-02-26 | Discharge: 2016-02-26 | Disposition: A | Discharge status: Home / Self Care | Payer: Medicare Other | Source: Ambulatory Visit | Attending: General Surgery | Admitting: General Surgery

## 2016-02-26 ENCOUNTER — Other Ambulatory Visit: Payer: Self-pay | Admitting: General Surgery

## 2016-02-26 DIAGNOSIS — Z1231 Encounter for screening mammogram for malignant neoplasm of breast: Secondary | ICD-10-CM

## 2016-02-26 DIAGNOSIS — R928 Other abnormal and inconclusive findings on diagnostic imaging of breast: Secondary | ICD-10-CM | POA: Diagnosis not present

## 2016-02-29 ENCOUNTER — Other Ambulatory Visit: Payer: Self-pay | Admitting: General Surgery

## 2016-02-29 DIAGNOSIS — R928 Other abnormal and inconclusive findings on diagnostic imaging of breast: Secondary | ICD-10-CM

## 2016-03-02 ENCOUNTER — Ambulatory Visit: Payer: BC Managed Care – PPO | Admitting: General Surgery

## 2016-03-04 ENCOUNTER — Ambulatory Visit
Admission: RE | Admit: 2016-03-04 | Discharge: 2016-03-04 | Disposition: A | Discharge status: Home / Self Care | Payer: Medicare Other | Source: Ambulatory Visit | Attending: General Surgery | Admitting: General Surgery

## 2016-03-04 DIAGNOSIS — R928 Other abnormal and inconclusive findings on diagnostic imaging of breast: Secondary | ICD-10-CM | POA: Insufficient documentation

## 2016-03-08 ENCOUNTER — Ambulatory Visit (INDEPENDENT_AMBULATORY_CARE_PROVIDER_SITE_OTHER): Payer: Medicare Other | Admitting: General Surgery

## 2016-03-08 ENCOUNTER — Other Ambulatory Visit: Payer: Self-pay

## 2016-03-08 ENCOUNTER — Encounter: Payer: Self-pay | Admitting: General Surgery

## 2016-03-08 VITALS — BP 140/72 | HR 70 | Resp 12 | Ht 67.0 in | Wt 174.0 lb

## 2016-03-08 DIAGNOSIS — Z86018 Personal history of other benign neoplasm: Secondary | ICD-10-CM

## 2016-03-08 DIAGNOSIS — Z87898 Personal history of other specified conditions: Secondary | ICD-10-CM

## 2016-03-08 DIAGNOSIS — N6310 Unspecified lump in the right breast, unspecified quadrant: Secondary | ICD-10-CM

## 2016-03-08 DIAGNOSIS — N63 Unspecified lump in breast: Secondary | ICD-10-CM

## 2016-03-08 NOTE — Patient Instructions (Addendum)
CARE AFTER BREAST BIOPSY  1. Leave the dressing on that your doctor applied after the biopsy. It is waterproof. You may bathe, shower and/or swim. The dressing can be removed in 3 days, you will see small strips of tape against your skin on the incision. Do not remove these strips they will gradually fall off in about 2-3 weeks. You may use an ice pack on and off for the first 12-24 hours for comfort.  2. You may want to use a gauze,cloth or similar protection in your bra to prevent rubbing against your dressing and incision. This is not necessary, but you may feel more comfortable doing so.  3. It is recommended that you wear a bra day and night to give support to the breast. This will prevent the weight of the breast from pulling on the incision.  4. Your breast may feel hard and lumpy under the incision. Do not be alarmed. This is the underlying stitching of tissue. Softening of this tissue will occur in time.  5. You may have a follow up appointment or phone follow up in one week after your biopsy. The office phone number is [phone].  6. You will notice about a week or two after your office visit that the strips of the tape on your incision will begin to loosen. These may then be removed.  7. Report to your doctor any of the following:  * Severe pain not relieved by your pain medication  *Redness of the incision  * Drainage from the incision  *Fever greater than 101 degrees   Return in three months

## 2016-03-08 NOTE — Progress Notes (Signed)
Patient ID: Kari Lyons, female   DOB: 1951/01/29, 65 y.o.   MRN: [id number]  Chief Complaint  Patient presents with  . Follow-up    mammogram    HPI Kari Lyons is a 65 y.o. female who presents for a breast evaluation. The most recent  Mammogram and right breast ultrasound was done on 02/26/16.  Patient does perform regular self breast checks and gets regular mammograms done.   I have reviewed the history of present illness with the patient.  HPI  Past Medical History  Diagnosis Date  . IBS (irritable bowel syndrome)   . GERD (gastroesophageal reflux disease)   . Depression   . Hypertension   . Edema   . Arrhythmia 2008  . Mitral valve prolapse   . 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 2009: Reported workup at [facility] including ECHO, Stress Test and Cardiac Cath; records N/A.  ECHO with mild LVH, mild MVP; stress test and cath reportedly normal   . Patient is Jehovah's Witness     Past Surgical History  Procedure Laterality Date  . Tonsilectomy, adenoidectomy, bilateral myringotomy and tubes    . Bilateral salpingoophorectomy  2002    due to fh of ovarian ca  . Abdominal hysterectomy  1985  . Colonoscopy  2012  . Breast surgery Left 02-26-13    subareloar duct excision and mass  . Excision / biopsy breast / nipple / duct Right 1985    negative  . Excision / biopsy breast / nipple / duct Left 02/26/2013    intraductal papilloma    Family History  Problem Relation Age of Onset  . Hypertension Mother   . Arthritis Mother   . Parkinson's disease Father   . Hypertension Sister   . Heart disease Daughter   . Cancer Sister 16    ovarian ca  . Cancer Brother     multiple myeloma  . Diabetes Brother   . Cancer Maternal Grandmother     colon ca  . Breast cancer Paternal Grandmother 36  . Bladder Cancer Paternal Uncle   . Prostate cancer Neg Hx   . Kidney cancer Neg Hx     Social History Social History  Substance Use Topics  . Smoking  status: Former Research scientist (life sciences)  . Smokeless tobacco: Never Used     Comment: smoked for 7month when 65yrold  . Alcohol Use: No    Allergies  Allergen Reactions  . Aloe Rash  . Aloe Vera Rash    Current Outpatient Prescriptions  Medication Sig Dispense Refill  . Ascorbic Acid (VITAMIN C) 100 MG tablet Take 100 mg by mouth daily.    . Cleotis LemaUva-Ursi, (UVA URSI PO) Take 500 mg by mouth 3 (three) times daily. Reported on 10/30/2015    . Cholecalciferol (VITAMIN D3) 2000 UNITS TABS Take 2,000 Units by mouth daily.    . Chromium 200 MCG TABS Take 1 tablet by mouth 2 (two) times daily.    . Marland KitchenINNAMON PO Take 1,000 tablets by mouth 2 (two) times daily.    . Fenugreek 610 MG CAPS Take 1 capsule by mouth daily. Reported on 10/30/2015    . Ginger, Zingiber officinalis, (GINGER PO) Reported on 10/30/2015    . HAWTHORN BERRIES PO Take 150 mg by mouth 3 (three) times daily.     . Jonna Coupeaf Extract 250 MG CAPS Take by mouth.    . Marland KitchenVER THE COUNTER MEDICATION Take 2 capsules by mouth at bedtime. VaMargorie John  Root    . OVER THE COUNTER MEDICATION Fennel 1 teaspoon by mouth daily.    . Potassium 99 MG TABS Take 1 tablet by mouth daily.     No current facility-administered medications for this visit.    Review of Systems Review of Systems  Constitutional: Negative.   Respiratory: Negative.   Cardiovascular: Negative.     Blood pressure 140/72, pulse 70, resp. rate 12, height _0  (1.702 m), weight 174 lb (78.926 kg).  Physical Exam Physical Exam  Constitutional: She is oriented to person, place, and time. She appears well-developed and well-nourished.  Eyes: Conjunctivae are normal. No scleral icterus.  Neck: Neck supple.  Cardiovascular: Normal rate, regular rhythm and normal heart sounds.   Pulmonary/Chest: Effort normal and breath sounds normal. Right breast exhibits no inverted nipple, no mass, no nipple discharge, no skin change and no tenderness. Left breast exhibits no inverted nipple, no  mass, no nipple discharge, no skin change and no tenderness.  Abdominal: Soft. Bowel sounds are normal. There is no tenderness.  Lymphadenopathy:    She has no cervical adenopathy.    She has no axillary adenopathy.  Neurological: She is oriented to person, place, and time.  Skin: Skin is warm and dry.    Data Reviewed Mammogram and ultrasound reviewed. Very vague irregular mass right breast 3 ocl.  Assessment    New finding in right breast at site of prior excision of intraductal papilloma. Core biopsy discussed and pt agreeable.      Plan    .Core biopsy completed-the vague area became notaably indistinct after one pass of needle. If biopsy is benign will recheck in 3 mos. Pt advised     PCP:  Deborra Medina  This information has been scribed by Gaspar Cola CMA.   SANKAR,SEEPLAPUTHUR G 03/08/2016, 11:28 AM

## 2016-03-09 ENCOUNTER — Telehealth: Payer: Self-pay | Admitting: *Deleted

## 2016-03-09 HISTORY — PX: BREAST BIOPSY: SHX20

## 2016-03-09 NOTE — Telephone Encounter (Signed)
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 Need to see her back in 3 mos.

## 2016-03-09 NOTE — Telephone Encounter (Signed)
Patient called and was notified of results from 03/08/16 biopsy. Patient verbalized understanding and a 3 month follow up appointment was made.

## 2016-03-09 NOTE — Telephone Encounter (Signed)
Notified patient as instructed, patient pleased. Discussed follow-up appointments, patient agrees  

## 2016-03-11 ENCOUNTER — Ambulatory Visit (INDEPENDENT_AMBULATORY_CARE_PROVIDER_SITE_OTHER): Payer: Medicare Other | Admitting: Internal Medicine

## 2016-03-11 ENCOUNTER — Encounter: Payer: Self-pay | Admitting: Internal Medicine

## 2016-03-11 VITALS — BP 150/88 | HR 66 | Temp 98.2°F | Resp 12 | Ht 65.0 in | Wt 173.2 lb

## 2016-03-11 DIAGNOSIS — I10 Essential (primary) hypertension: Secondary | ICD-10-CM

## 2016-03-11 DIAGNOSIS — Z0000 Encounter for general adult medical examination without abnormal findings: Secondary | ICD-10-CM | POA: Diagnosis not present

## 2016-03-11 DIAGNOSIS — Z136 Encounter for screening for cardiovascular disorders: Secondary | ICD-10-CM | POA: Diagnosis not present

## 2016-03-11 DIAGNOSIS — D241 Benign neoplasm of right breast: Secondary | ICD-10-CM

## 2016-03-11 DIAGNOSIS — E785 Hyperlipidemia, unspecified: Secondary | ICD-10-CM | POA: Diagnosis not present

## 2016-03-11 DIAGNOSIS — E559 Vitamin D deficiency, unspecified: Secondary | ICD-10-CM | POA: Diagnosis not present

## 2016-03-11 DIAGNOSIS — R5383 Other fatigue: Secondary | ICD-10-CM | POA: Diagnosis not present

## 2016-03-11 DIAGNOSIS — Z780 Asymptomatic menopausal state: Secondary | ICD-10-CM

## 2016-03-11 DIAGNOSIS — D242 Benign neoplasm of left breast: Secondary | ICD-10-CM

## 2016-03-11 LAB — VITAMIN D 25 HYDROXY (VIT D DEFICIENCY, FRACTURES): VITD: 40 ng/mL (ref 30.00–100.00)

## 2016-03-11 LAB — LIPID PANEL
Cholesterol: 112 mg/dL (ref 0–200)
HDL: 40.1 mg/dL (ref >39.00)
LDL Cholesterol: 58 mg/dL (ref 0–99)
NonHDL: 72.14
Total CHOL/HDL Ratio: 3
Triglycerides: 70 mg/dL (ref 0.0–149.0)
VLDL: 14 mg/dL (ref 0.0–40.0)

## 2016-03-11 LAB — COMPREHENSIVE METABOLIC PANEL
ALT: 15 U/L (ref 0–35)
AST: 21 U/L (ref 0–37)
Albumin: 4.5 g/dL (ref 3.5–5.2)
Alkaline Phosphatase: 74 U/L (ref 39–117)
BUN: 6 mg/dL (ref 6–23)
CO2: 32 mEq/L (ref 19–32)
Calcium: 9.7 mg/dL (ref 8.4–10.5)
Chloride: 106 mEq/L (ref 96–112)
Creatinine, Ser: 0.71 mg/dL (ref 0.40–1.20)
GFR: 105.98 mL/min (ref >60.00)
Glucose, Bld: 72 mg/dL (ref 70–99)
Potassium: 4 mEq/L (ref 3.5–5.1)
Sodium: 139 mEq/L (ref 135–145)
Total Bilirubin: 0.6 mg/dL (ref 0.2–1.2)
Total Protein: 7.1 g/dL (ref 6.0–8.3)

## 2016-03-11 LAB — CBC WITH DIFFERENTIAL/PLATELET
Basophils Absolute: 0 10*3/uL (ref 0.0–0.1)
Basophils Relative: 0.3 % (ref 0.0–3.0)
Eosinophils Absolute: 0 10*3/uL (ref 0.0–0.7)
Eosinophils Relative: 0.7 % (ref 0.0–5.0)
HCT: 40.4 % (ref 36.0–46.0)
Hemoglobin: 13.2 g/dL (ref 12.0–15.0)
Lymphocytes Relative: 48.3 % — ABNORMAL HIGH (ref 12.0–46.0)
Lymphs Abs: 1.8 10*3/uL (ref 0.7–4.0)
MCHC: 32.8 g/dL (ref 30.0–36.0)
MCV: 86.7 fl (ref 78.0–100.0)
Monocytes Absolute: 0.3 10*3/uL (ref 0.1–1.0)
Monocytes Relative: 7.2 % (ref 3.0–12.0)
Neutro Abs: 1.7 10*3/uL (ref 1.4–7.7)
Neutrophils Relative %: 43.5 % (ref 43.0–77.0)
Platelets: 198 10*3/uL (ref 150.0–400.0)
RBC: 4.66 Mil/uL (ref 3.87–5.11)
RDW: 13.9 % (ref 11.5–15.5)
WBC: 3.8 10*3/uL — ABNORMAL LOW (ref 4.0–10.5)

## 2016-03-11 LAB — TSH: TSH: 1.13 u[IU]/mL (ref 0.35–4.50)

## 2016-03-11 MED ORDER — TRIAMTERENE-HCTZ 37.5-25 MG PO TABS: 1.0000 | ORAL_TABLET | Freq: Every day | ORAL | Status: DC

## 2016-03-11 NOTE — Progress Notes (Signed)
Pre-visit discussion using our clinic review tool. No additional management support is needed unless otherwise documented below in the visit note.  

## 2016-03-11 NOTE — Patient Instructions (Signed)
Starting Maxzide for your blood pressure  Please rreturn in 2 week for BP check and repeat potassium check  Bone Density test and Abdominal ultrasound will be ordered  You have deferred the Prevnar (Pneumonia) vaccine  The TDap is $110 out of pocket since medicare will not pay for it,    Most pharmacies will offer it as well for far less  ([employer] supposed;y offers it for $30 to 60 as the Health Dept.)   Menopause is a normal process in which your reproductive ability comes to an end. This process happens gradually over a span of months to years, usually between the ages of 90 and 2. Menopause is complete when you have missed 12 consecutive menstrual periods. It is important to talk with your health care provider about some of the most common conditions that affect postmenopausal women, such as heart disease, cancer, and bone loss (osteoporosis). Adopting a healthy lifestyle and getting preventive care can help to promote your health and wellness. Those actions can also lower your chances of developing some of these common conditions. WHAT SHOULD I KNOW ABOUT MENOPAUSE? During menopause, you may experience a number of symptoms, such as:  Moderate-to-severe hot flashes.  Night sweats.  Decrease in sex drive.  Mood swings.  Headaches.  Tiredness.  Irritability.  Memory problems.  Insomnia. Choosing to treat or not to treat menopausal changes is an individual decision that you make with your health care provider. WHAT SHOULD I KNOW ABOUT HORMONE REPLACEMENT THERAPY AND SUPPLEMENTS? Hormone therapy products are effective for treating symptoms that are associated with menopause, such as hot flashes and night sweats. Hormone replacement carries certain risks, especially as you become older. If you are thinking about using estrogen or estrogen with progestin treatments, discuss the benefits and risks with your health care provider. WHAT SHOULD I KNOW ABOUT HEART DISEASE AND  STROKE? Heart disease, heart attack, and stroke become more likely as you age. This may be due, in part, to the hormonal changes that your body experiences during menopause. These can affect how your body processes dietary fats, triglycerides, and cholesterol. Heart attack and stroke are both medical emergencies. There are many things that you can do to help prevent heart disease and stroke:  Have your blood pressure checked at least every 1-2 years. High blood pressure causes heart disease and increases the risk of stroke.  If you are 83-79 years old, ask your health care provider if you should take aspirin to prevent a heart attack or a stroke.  Do not use any tobacco products, including cigarettes, chewing tobacco, or electronic cigarettes. If you need help quitting, ask your health care provider.  It is important to eat a healthy diet and maintain a healthy weight.  Be sure to include plenty of vegetables, fruits, low-fat dairy products, and lean protein.  Avoid eating foods that are high in solid fats, added sugars, or salt (sodium).  Get regular exercise. This is one of the most important things that you can do for your health.  Try to exercise for at least 150 minutes each week. The type of exercise that you do should increase your heart rate and make you sweat. This is known as moderate-intensity exercise.  Try to do strengthening exercises at least twice each week. Do these in addition to the moderate-intensity exercise.  Know your numbers.Ask your health care provider to check your cholesterol and your blood glucose. Continue to have your blood tested as directed by your health care provider.  WHAT SHOULD I KNOW ABOUT CANCER SCREENING? There are several types of cancer. Take the following steps to reduce your risk and to catch any cancer development as early as possible. Breast Cancer  Practice breast self-awareness.  This means understanding how your breasts normally appear  and feel.  It also means doing regular breast self-exams. Let your health care provider know about any changes, no matter how small.  If you are 40 or older, have a clinician do a breast exam (clinical breast exam or CBE) every year. Depending on your age, family history, and medical history, it may be recommended that you also have a yearly breast X-ray (mammogram).  If you have a family history of breast cancer, talk with your health care provider about genetic screening.  If you are at high risk for breast cancer, talk with your health care provider about having an MRI and a mammogram every year.  Breast cancer (BRCA) gene test is recommended for women who have family members with BRCA-related cancers. Results of the assessment will determine the need for genetic counseling and BRCA1 and for BRCA2 testing. BRCA-related cancers include these types:  Breast. This occurs in males or females.  Ovarian.  Tubal. This may also be called fallopian tube cancer.  Cancer of the abdominal or pelvic lining (peritoneal cancer).  Prostate.  Pancreatic. Cervical, Uterine, and Ovarian Cancer Your health care provider may recommend that you be screened regularly for cancer of the pelvic organs. These include your ovaries, uterus, and vagina. This screening involves a pelvic exam, which includes checking for microscopic changes to the surface of your cervix (Pap test).  For women ages 21-65, health care providers may recommend a pelvic exam and a Pap test every three years. For women ages 18-65, they may recommend the Pap test and pelvic exam, combined with testing for human papilloma virus (HPV), every five years. Some types of HPV increase your risk of cervical cancer. Testing for HPV may also be done on women of any age who have unclear Pap test results.  Other health care providers may not recommend any screening for nonpregnant women who are considered low risk for pelvic cancer and have no  symptoms. Ask your health care provider if a screening pelvic exam is right for you.  If you have had past treatment for cervical cancer or a condition that could lead to cancer, you need Pap tests and screening for cancer for at least 20 years after your treatment. If Pap tests have been discontinued for you, your risk factors (such as having a new sexual partner) need to be reassessed to determine if you should start having screenings again. Some women have medical problems that increase the chance of getting cervical cancer. In these cases, your health care provider may recommend that you have screening and Pap tests more often.  If you have a family history of uterine cancer or ovarian cancer, talk with your health care provider about genetic screening.  If you have vaginal bleeding after reaching menopause, tell your health care provider.  There are currently no reliable tests available to screen for ovarian cancer. Lung Cancer Lung cancer screening is recommended for adults 79-16 years old who are at high risk for lung cancer because of a history of smoking. A yearly low-dose CT scan of the lungs is recommended if you:  Currently smoke.  Have a history of at least 30 pack-years of smoking and you currently smoke or have quit within the past 15 years.  A pack-year is smoking an average of one pack of cigarettes per day for one year. Yearly screening should:  Continue until it has been 15 years since you quit.  Stop if you develop a health problem that would prevent you from having lung cancer treatment. Colorectal Cancer  This type of cancer can be detected and can often be prevented.  Routine colorectal cancer screening usually begins at age 24 and continues through age 57.  If you have risk factors for colon cancer, your health care provider may recommend that you be screened at an earlier age.  If you have a family history of colorectal cancer, talk with your health care provider  about genetic screening.  Your health care provider may also recommend using home test kits to check for hidden blood in your stool.  A small camera at the end of a tube can be used to examine your colon directly (sigmoidoscopy or colonoscopy). This is done to check for the earliest forms of colorectal cancer.  Direct examination of the colon should be repeated every 5-10 years until age 76. However, if early forms of precancerous polyps or small growths are found or if you have a family history or genetic risk for colorectal cancer, you may need to be screened more often. Skin Cancer  Check your skin from head to toe regularly.  Monitor any moles. Be sure to tell your health care provider:  About any new moles or changes in moles, especially if there is a change in a mole's shape or color.  If you have a mole that is larger than the size of a pencil eraser.  If any of your family members has a history of skin cancer, especially at a young age, talk with your health care provider about genetic screening.  Always use sunscreen. Apply sunscreen liberally and repeatedly throughout the day.  Whenever you are outside, protect yourself by wearing long sleeves, pants, a wide-brimmed hat, and sunglasses. WHAT SHOULD I KNOW ABOUT OSTEOPOROSIS? Osteoporosis is a condition in which bone destruction happens more quickly than new bone creation. After menopause, you may be at an increased risk for osteoporosis. To help prevent osteoporosis or the bone fractures that can happen because of osteoporosis, the following is recommended:  If you are 42-68 years old, get at least 1,000 mg of calcium and at least 600 mg of vitamin D per day.  If you are older than age 9 but younger than age 66, get at least 1,200 mg of calcium and at least 600 mg of vitamin D per day.  If you are older than age 66, get at least 1,200 mg of calcium and at least 800 mg of vitamin D per day. Smoking and excessive alcohol intake  increase the risk of osteoporosis. Eat foods that are rich in calcium and vitamin D, and do weight-bearing exercises several times each week as directed by your health care provider. WHAT SHOULD I KNOW ABOUT HOW MENOPAUSE AFFECTS [place]? Depression may occur at any age, but it is more common as you become older. Common symptoms of depression include:  Low or sad mood.  Changes in sleep patterns.  Changes in appetite or eating patterns.  Feeling an overall lack of motivation or enjoyment of activities that you previously enjoyed.  Frequent crying spells. Talk with your health care provider if you think that you are experiencing depression. WHAT SHOULD I KNOW ABOUT IMMUNIZATIONS? It is important that you get and maintain your immunizations. These  include:  Tetanus, diphtheria, and pertussis (Tdap) booster vaccine.  Influenza every year before the flu season begins.  Pneumonia vaccine.  Shingles vaccine. Your health care provider may also recommend other immunizations.   This information is not intended to replace advice given to you by your health care provider. Make sure you discuss any questions you have with your health care provider.   Document Released: 11/25/2005 Document Revised: 10/24/2014 Document Reviewed: 06/05/2014 Elsevier Interactive Patient Education [employer].

## 2016-03-11 NOTE — Progress Notes (Signed)
Patient ID: Kari Lyons, female    DOB: 07-04-1951  Age: 65 y.o. MRN: [id number]  The patient is here for welcome to  Medicare wellness examination and management of other chronic and acute problems.    She underwent Colonoscopy by Dr. Tiffany Kocher in 2012  10 yr follow up \ She is s/p TAH/BSO due to [place] of ovarian CA History of Breast biopsy, left , 2014 intraductal papilloma, surgical excision and annual follow up by Dr. Jamal Collin Right breast core biopsy done Mar 08, 2016, intraductal papilloma. F/u 3 months    The risk factors are reflected in the social history.  The roster of all physicians providing medical care to patient - is listed in the Snapshot section of the chart.  Activities of daily living:  The patient is 100% independent in all ADLs: dressing, toileting, feeding as well as independent mobility  Home safety : The patient has smoke detectors in the home. They wear seatbelts.  There are no firearms at home. There is no violence in the home.   There is no risks for hepatitis, STDs or HIV. There is no   history of blood transfusion. They have no travel history to infectious disease endemic areas of the world.  The patient has seen their dentist in the last six month. They have seen their eye doctor in the last year. They admit to slight hearing difficulty with regard to whispered voices and some television programs.  They have deferred audiologic testing in the last year.  They do not  have excessive sun exposure. Discussed the need for sun protection: hats, long sleeves and use of sunscreen if there is significant sun exposure.   Diet: the importance of a healthy diet is discussed. They do have a healthy diet.  The benefits of regular aerobic exercise were discussed. She walks 4 times per week ,  20 minutes.   Depression screen: there are no signs or vegative symptoms of depression- irritability, change in appetite, anhedonia, sadness/tearfullness.  Cognitive assessment: the  patient manages all their financial and personal affairs and is actively engaged. They could relate day,date,year and events; recalled 2/3 objects at 3 minutes; performed clock-face test normally.  The following portions of the patient's history were reviewed and updated as appropriate: allergies, current medications, past family history, past medical history,  past surgical history, past social history  and problem list.  Visual acuity was not assessed per patient preference since she has regular follow up with her ophthalmologist. Hearing and body mass index were assessed and reviewed.   During the course of the visit the patient was educated and counseled about appropriate screening and preventive services including : fall prevention , diabetes screening, nutrition counseling, colorectal cancer screening, and recommended immunizations.    CC: The primary encounter diagnosis was Hyperlipidemia. Diagnoses of Other fatigue, Vitamin D deficiency, Screening for abdominal aortic aneurysm, Essential hypertension, Postmenopausal estrogen deficiency, Welcome to Medicare preventive visit, Intraductal papilloma of left breast, and Intraductal papilloma of right breast were also pertinent to this visit.  Joints aching : lower spine and knees,,  Had a flare last Friday that has resolved.  Avoids certain foods including citrus  But eats blueberries and strawberries  Eevated blood pressure.  Sees Dr. Edwin Dada, cardiology,  Who recommended keep BP  < 140/80 . 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   She has past surgical history that includes Tonsilectomy, adenoidectomy, bilateral myringotomy and tubes; Bilateral salpingoophorectomy (2002);  Abdominal hysterectomy (1985); Colonoscopy (2012); Breast surgery (Left, 02-26-13); Excision / biopsy breast / nipple / duct (Right, 1985); and Excision / biopsy breast / nipple / duct (Left, 02/26/2013).   Her family history includes Arthritis in her mother; Bladder Cancer in her paternal uncle; Breast cancer (age of onset: 69) in her paternal grandmother; Cancer in her brother and maternal grandmother; Cancer (age of onset: 28) in her sister; Diabetes in her brother; Heart disease in her daughter; Hypertension in her mother and sister; Parkinson's disease in her father. There is no history of Prostate cancer or Kidney cancer.She reports that she has quit smoking. She has never used smokeless tobacco. She reports that she does not drink alcohol or use illicit drugs.  Outpatient Prescriptions Prior to Visit  Medication Sig Dispense Refill  . Ascorbic Acid (VITAMIN C) 100 MG tablet Take 100 mg by mouth daily.    Marland Kitchen OVER THE COUNTER MEDICATION Take 2 capsules by mouth at bedtime. Valarian Root    . OVER THE COUNTER MEDICATION Fennel 1 teaspoon by mouth daily.    Cleotis Lema, Uva-Ursi, (UVA URSI PO) Take 500 mg by mouth 3 (three) times daily. Reported on 03/11/2016    . Cholecalciferol (VITAMIN D3) 2000 UNITS TABS Take 2,000 Units by mouth daily. Reported on 03/11/2016    . Chromium 200 MCG TABS Take 1 tablet by mouth 2 (two) times daily. Reported on 03/11/2016    . CINNAMON PO Take 1,000 tablets by mouth 2 (two) times daily. Reported on 03/11/2016    . Fenugreek 610 MG CAPS Take 1 capsule by mouth daily. Reported on 03/11/2016    . Ginger, Zingiber officinalis, (GINGER PO) Reported on 03/11/2016    . HAWTHORN BERRIES PO Take 150 mg by mouth 3 (three) times daily. Reported on 03/11/2016    . Olive Leaf Extract 250 MG CAPS Take by mouth. Reported on 03/11/2016    . Potassium 99 MG TABS Take 1 tablet by mouth daily. Reported on 03/11/2016     No facility-administered medications prior to visit.    Review of  Systems   Patient denies headache, fevers, malaise, unintentional weight loss, skin rash, eye pain, sinus congestion and sinus pain, sore throat, dysphagia,  hemoptysis , cough, dyspnea, wheezing, chest pain, palpitations, orthopnea, edema, abdominal pain, nausea, melena, diarrhea, constipation, flank pain, dysuria, hematuria, urinary  Frequency, nocturia, numbness, tingling, seizures,  Focal weakness, Loss of consciousness,  Tremor, insomnia, depression, anxiety, and suicidal ideation.      Objective:  BP 150/88 mmHg  Pulse 66  Temp(Src) 98.2 F (36.8 C) (Oral)  Resp 12  Ht 5\' 5"  (1.651 m)  Wt 173 lb 4 oz (78.586 kg)  BMI 28.83 kg/m2  SpO2 97%  Physical Exam  General appearance: alert, cooperative and appears stated age Head: Normocephalic, without obvious abnormality, atraumatic Eyes: conjunctivae/corneas clear. PERRL, EOM's intact. Fundi benign. Ears: normal TM's and external ear canals both ears Nose: Nares normal. Septum midline. Mucosa normal. No drainage or sinus tenderness. Throat: lips, mucosa, and tongue normal; teeth and gums normal Neck: no adenopathy, no carotid bruit, no JVD, supple, symmetrical, trachea midline and thyroid not enlarged, symmetric, no tenderness/mass/nodules Lungs: clear to auscultation bilaterally Breasts: normal appearance, no masses or tenderness Heart: regular rate and rhythm, S1, S2 normal, no murmur, click, rub or gallop Abdomen: soft, non-tender; bowel sounds  normal; no masses,  no organomegaly Extremities: extremities normal, atraumatic, no cyanosis or edema Pulses: 2+ and symmetric Skin: Skin color, texture, turgor normal. No rashes or lesions Neurologic: Alert and oriented X 3, normal strength and tone. Normal symmetric reflexes. Normal coordination and gait.   Assessment & Plan:   Problem List Items Addressed This Visit    Intraductal papilloma of left breast    May 2014 excisional biopsy      Screening for abdominal aortic aneurysm     She has risk factors of hypertension.  Screening advised.       Relevant Orders   US Abdomen Complete   RESOLVED: Hyperlipidemia - Primary   Relevant Medications   triamterene-hydrochlorothiazide (MAXZIDE-25) 37.5-25 MG tablet   Other Relevant Orders   Lipid panel (Completed)   Postmenopausal estrogen deficiency    Surgically induced due to prophylactic BSO.Marland Kitchen  DEXA ordered       Relevant Orders   DG Bone Density   Essential hypertension    Confirmed with elevated home readings.  Adding maxzide. Return in 2 weeks for BMET and BP check   Lab Results  Component Value Date   CREATININE 0.71 03/11/2016   Lab Results  Component Value Date   NA 139 03/11/2016   K 4.0 03/11/2016   CL 106 03/11/2016   CO2 32 03/11/2016          Relevant Medications   triamterene-hydrochlorothiazide (MAXZIDE-25) 37.5-25 MG tablet   Welcome to Medicare preventive visit    Welcome to  Medicare   exam was done as well as a comprehensive physical exam and management of acute and chronic conditions .  During the course of the visit the patient was educated and counseled about appropriate screening and preventive services including : fall prevention , diabetes screening, nutrition counseling, colorectal cancer screening, and recommended immunizations.  Printed recommendations for health maintenance screenings was given.   Lab Results  Component Value Date   CHOL 112 03/11/2016   HDL 40.10 03/11/2016   LDLCALC 58 03/11/2016   TRIG 70.0 03/11/2016   CHOLHDL 3 03/11/2016  \       Intraductal papilloma of right breast    By core biopsy May 2017       Other Visit Diagnoses    Other fatigue        Relevant Orders    Comprehensive metabolic panel (Completed)    HIV antibody (Completed)    TSH (Completed)    CBC with Differential/Platelet (Completed)    Vitamin D deficiency        Relevant Orders    VITAMIN D 25 Hydroxy (Vit-D Deficiency, Fractures) (Completed)       I am having Ms.  Teale start on triamterene-hydrochlorothiazide. I am also having her maintain her HAWTHORN BERRIES PO, ([place], Uva-Ursi, (UVA URSI PO)), Vitamin D3, Fenugreek, Potassium, CINNAMON PO, Chromium, OVER THE COUNTER MEDICATION, (Ginger, Zingiber officinalis, (GINGER PO)), OVER THE COUNTER MEDICATION, Olive Leaf Extract, and vitamin C.  Meds ordered this encounter  Medications  . triamterene-hydrochlorothiazide (MAXZIDE-25) 37.5-25 MG tablet    Sig: Take 1 tablet by mouth daily.    Dispense:  90 tablet    Refill:  3    There are no discontinued medications.  Follow-up: No Follow-up on file.   [name], MD

## 2016-03-12 LAB — HIV ANTIBODY (ROUTINE TESTING W REFLEX): HIV 1&2 Ab, 4th Generation: NONREACTIVE

## 2016-03-13 DIAGNOSIS — D241 Benign neoplasm of right breast: Secondary | ICD-10-CM | POA: Insufficient documentation

## 2016-03-13 DIAGNOSIS — Z0000 Encounter for general adult medical examination without abnormal findings: Secondary | ICD-10-CM | POA: Insufficient documentation

## 2016-03-13 DIAGNOSIS — E785 Hyperlipidemia, unspecified: Secondary | ICD-10-CM | POA: Insufficient documentation

## 2016-03-13 DIAGNOSIS — Z780 Asymptomatic menopausal state: Secondary | ICD-10-CM | POA: Insufficient documentation

## 2016-03-13 DIAGNOSIS — Z136 Encounter for screening for cardiovascular disorders: Secondary | ICD-10-CM | POA: Insufficient documentation

## 2016-03-13 DIAGNOSIS — I10 Essential (primary) hypertension: Secondary | ICD-10-CM | POA: Insufficient documentation

## 2016-03-13 NOTE — Assessment & Plan Note (Signed)
Surgically induced due to prophylactic BSO.Marland Kitchen  DEXA ordered

## 2016-03-13 NOTE — Assessment & Plan Note (Signed)
May 2014 excisional biopsy

## 2016-03-13 NOTE — Assessment & Plan Note (Signed)
She has risk factors of hypertension.  Screening advised.

## 2016-03-13 NOTE — Assessment & Plan Note (Signed)
By core biopsy May 2017

## 2016-03-13 NOTE — Assessment & Plan Note (Signed)
Welcome to  Medicare   exam was done as well as a comprehensive physical exam and management of acute and chronic conditions .  During the course of the visit the patient was educated and counseled about appropriate screening and preventive services including : fall prevention , diabetes screening, nutrition counseling, colorectal cancer screening, and recommended immunizations.  Printed recommendations for health maintenance screenings was given.   Lab Results  Component Value Date   CHOL 112 03/11/2016   HDL 40.10 03/11/2016   LDLCALC 58 03/11/2016   TRIG 70.0 03/11/2016   CHOLHDL 3 03/11/2016  \

## 2016-03-13 NOTE — Assessment & Plan Note (Addendum)
Confirmed with elevated home readings.  Adding maxzide. Return in 2 weeks for BMET and BP check   Lab Results  Component Value Date   CREATININE 0.71 03/11/2016   Lab Results  Component Value Date   NA 139 03/11/2016   K 4.0 03/11/2016   CL 106 03/11/2016   CO2 32 03/11/2016

## 2016-03-14 ENCOUNTER — Other Ambulatory Visit: Payer: Self-pay | Admitting: Internal Medicine

## 2016-03-14 DIAGNOSIS — I10 Essential (primary) hypertension: Secondary | ICD-10-CM

## 2016-03-15 ENCOUNTER — Encounter: Payer: Self-pay | Admitting: *Deleted

## 2016-03-16 ENCOUNTER — Telehealth: Payer: Self-pay

## 2016-03-16 NOTE — Telephone Encounter (Signed)
No,  Cancel it then.  I thought it was still offered by [facility]

## 2016-03-16 NOTE — Telephone Encounter (Signed)
Spoke with Colletta Maryland at [facility] to cancel the [place].  Then spoke with the patient and explained that we were going to cancel the test, she verbalized understanding. thanks

## 2016-03-16 NOTE — Telephone Encounter (Signed)
Kari Lyons from [facility] [place] wanted to confirm that patient was needing the [place] ordered for tomorrow.  Patient had a Welcome medicare screening appt and it used to be that medicare gave a free AAA screening with that welcome visit.  Medicare changed that on January 1st, this year and it is only covered if there is a warranting reason for the test, i.e a family history or symptoms or it is not covered.  Kari Lyons wanted to make sure that patient needs this test done and if so, it was ordered as a abdominal complete and if you are wanting them to look at the aorta, it would need to be a Retroperineal complete to look at the aorta and kidneys.  Please advise and if questions she can be reached at [phone]. Thanks

## 2016-03-17 ENCOUNTER — Ambulatory Visit: Admission: RE | Admit: 2016-03-17 | Payer: Medicare Other | Source: Ambulatory Visit

## 2016-03-24 ENCOUNTER — Other Ambulatory Visit: Payer: Medicare Other

## 2016-04-05 ENCOUNTER — Ambulatory Visit (INDEPENDENT_AMBULATORY_CARE_PROVIDER_SITE_OTHER): Payer: Medicare Other

## 2016-04-05 ENCOUNTER — Other Ambulatory Visit (INDEPENDENT_AMBULATORY_CARE_PROVIDER_SITE_OTHER): Payer: Medicare Other

## 2016-04-05 VITALS — BP 126/76 | HR 67

## 2016-04-05 DIAGNOSIS — I10 Essential (primary) hypertension: Secondary | ICD-10-CM

## 2016-04-05 LAB — BASIC METABOLIC PANEL
BUN: 9 mg/dL (ref 6–23)
CO2: 31 mEq/L (ref 19–32)
Calcium: 9.5 mg/dL (ref 8.4–10.5)
Chloride: 103 mEq/L (ref 96–112)
Creatinine, Ser: 0.74 mg/dL (ref 0.40–1.20)
GFR: 101.01 mL/min (ref >60.00)
Glucose, Bld: 93 mg/dL (ref 70–99)
Potassium: 4.1 mEq/L (ref 3.5–5.1)
Sodium: 138 mEq/L (ref 135–145)

## 2016-04-05 NOTE — Progress Notes (Signed)
Pt was in today getting a BP check. Pt has been on BP medication for 3 weeks. She has been taking it like prescribed.

## 2016-04-07 NOTE — Progress Notes (Signed)
BP is in normal range. Should continue medication and continue to follow up with Dr Derrel Nip.   Tommi Rumps, M.D.

## 2016-04-10 NOTE — Progress Notes (Signed)
  I have reviewed the above information and agree with above.   Teresa Tullo, MD 

## 2016-04-11 ENCOUNTER — Encounter: Payer: Self-pay | Admitting: *Deleted

## 2016-05-27 ENCOUNTER — Encounter: Payer: Self-pay | Admitting: Internal Medicine

## 2016-06-01 ENCOUNTER — Encounter: Payer: Self-pay | Admitting: General Surgery

## 2016-06-01 ENCOUNTER — Other Ambulatory Visit: Payer: Self-pay

## 2016-06-01 ENCOUNTER — Ambulatory Visit (INDEPENDENT_AMBULATORY_CARE_PROVIDER_SITE_OTHER): Payer: Medicare Other | Admitting: General Surgery

## 2016-06-01 VITALS — BP 124/76 | HR 70 | Resp 12 | Ht 65.0 in | Wt 171.0 lb

## 2016-06-01 DIAGNOSIS — N6310 Unspecified lump in the right breast, unspecified quadrant: Secondary | ICD-10-CM

## 2016-06-01 DIAGNOSIS — N63 Unspecified lump in breast: Secondary | ICD-10-CM

## 2016-06-01 NOTE — Progress Notes (Signed)
Patient ID: Kari Lyons, female   DOB: 05/12/1951, 65 y.o.   MRN: [id number]  Chief Complaint  Patient presents with  . Follow-up    right breast     HPI Kari Lyons is a 65 y.o. female here today for her follow up of a  right breast biopsy done on 03/08/2016. 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 Patient states she is doing well.  I have reviewed the history of present illness with the patient.  HPI  Past Medical History:  Diagnosis Date  . Arrhythmia 2008  . 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 2009: Reported workup at  including ECHO, Stress Test and Cardiac Cath; records N/A.  ECHO with mild LVH, mild MVP; stress test and cath reportedly normal   . Depression   . Edema   . GERD (gastroesophageal reflux disease)   . Hypertension   . IBS (irritable bowel syndrome)   . Mitral valve prolapse   . Patient is Jehovah's Witness     Past Surgical History:  Procedure Laterality Date  . ABDOMINAL HYSTERECTOMY  1985  . BILATERAL SALPINGOOPHORECTOMY  2002   due to fh of ovarian ca  . BREAST SURGERY Left 02-26-13   subareloar duct excision and mass  . COLONOSCOPY  2012  . EXCISION / BIOPSY BREAST / NIPPLE / DUCT Right 1985   negative  . EXCISION / BIOPSY BREAST / NIPPLE / DUCT Left 02/26/2013   intraductal papilloma  . TONSILECTOMY, ADENOIDECTOMY, BILATERAL MYRINGOTOMY AND TUBES      Family History  Problem Relation Age of Onset  . Hypertension Mother   . Arthritis Mother   . Parkinson's disease Father   . Hypertension Sister   . Heart disease Daughter   . Cancer Sister 48    ovarian ca  . Cancer Brother     multiple myeloma  . Diabetes Brother   . Cancer Maternal Grandmother     colon ca  . Breast cancer Paternal Grandmother 32  . Bladder Cancer Paternal Uncle   . Prostate cancer Neg Hx   . Kidney cancer Neg Hx     Social History Social History    Substance Use Topics  . Smoking status: Former Smoker  . Smokeless tobacco: Never Used     Comment: smoked for 3months when 65yrs old  . Alcohol use No    Allergies  Allergen Reactions  . Aloe Rash  . Aloe Vera Rash    Current Outpatient Prescriptions  Medication Sig Dispense Refill  . Ascorbic Acid (VITAMIN C) 100 MG tablet Take 100 mg by mouth daily.    . Bearberry, Uva-Ursi, (UVA URSI PO) Take 500 mg by mouth 3 (three) times daily. Reported on 03/11/2016    . Cholecalciferol (VITAMIN D3) 2000 UNITS TABS Take 2,000 Units by mouth daily. Reported on 03/11/2016    . Chromium 200 MCG TABS Take 1 tablet by mouth 2 (two) times daily. Reported on 03/11/2016    . CINNAMON PO Take 1,000 tablets by mouth 2 (two) times daily. Reported on 03/11/2016    . Fenugreek 610 MG CAPS Take 1 capsule by mouth daily. Reported on 03/11/2016    . Ginger, Zingiber officinalis, (GINGER PO) Reported on 03/11/2016    . HAWTHORN BERRIES PO Take 150 mg by mouth 3 (three) times daily. Reported on 03/11/2016    . Olive Leaf Extract 250 MG CAPS Take by mouth. Reported   on 03/11/2016    . OVER THE COUNTER MEDICATION Take 2 capsules by mouth at bedtime. Valarian Root    . OVER THE COUNTER MEDICATION Fennel 1 teaspoon by mouth daily.    . Potassium 99 MG TABS Take 1 tablet by mouth daily. Reported on 03/11/2016    . triamterene-hydrochlorothiazide (MAXZIDE-25) 37.5-25 MG tablet Take 1 tablet by mouth daily. 90 tablet 3   No current facility-administered medications for this visit.     Review of Systems Review of Systems  Constitutional: Negative.   Respiratory: Negative.   Cardiovascular: Negative.     Blood pressure 124/76, pulse 70, resp. rate 12, height 5' 5" (1.651 m), weight 171 lb (77.6 kg).  Focused  Physical Exam Physical Exam  Constitutional: She is oriented to person, place, and time. She appears well-developed and well-nourished.  Eyes: Conjunctivae are normal. No scleral icterus.  Neck: Neck supple.   Cardiovascular: Normal rate and normal heart sounds.   Pulmonary/Chest: Effort normal and breath sounds normal. No respiratory distress. Right breast exhibits inverted nipple. Right breast exhibits no mass, no nipple discharge, no skin change and no tenderness.    Lymphadenopathy:    She has no cervical adenopathy.    She has no axillary adenopathy.  Neurological: She is alert and oriented to person, place, and time.  Skin: Skin is warm and dry.    Data Reviewed Prior notes, mammogram reviewed Focused ultrasound-: Irregular, vague right breast mass, hypoechoic 3cm from nipple at [facility] still present. 1.08 cm at its largest measurement.   Assessment    Right breast mass - Slightly larger from last visit. Excision recommended.     Plan      Patient to be scheduled for excision right breast mass. Pt is agreeable. The patient is scheduled for surgery at [facility] on 06/08/16. She will pre admit by phone, the patient is aware of date and instructions.   This information has been scribed by Gaspar Cola CMA.   Yara Tomkinson G 06/01/2016, 1:03 PM

## 2016-06-01 NOTE — Patient Instructions (Addendum)
Patient to be scheduled at [facility] for excision.   The patient is scheduled for surgery at [facility] on 06/08/16. She will pre admit by phone, the patient is aware of date and instructions.

## 2016-06-03 ENCOUNTER — Encounter
Admission: RE | Admit: 2016-06-03 | Discharge: 2016-06-03 | Disposition: A | Discharge status: Home / Self Care | Payer: Medicare Other | Source: Ambulatory Visit | Attending: General Surgery | Admitting: General Surgery

## 2016-06-03 ENCOUNTER — Other Ambulatory Visit: Payer: Self-pay | Admitting: General Surgery

## 2016-06-03 DIAGNOSIS — N6310 Unspecified lump in the right breast, unspecified quadrant: Secondary | ICD-10-CM

## 2016-06-03 HISTORY — DX: Sleep apnea, unspecified: G47.30

## 2016-06-03 HISTORY — DX: Anemia, unspecified: D64.9

## 2016-06-03 HISTORY — DX: Unspecified osteoarthritis, unspecified site: M19.90

## 2016-06-03 NOTE — Pre-Procedure Instructions (Signed)
Progress Notes - in this encounter  Little Ishikawa, PA - 03/14/2014 11:18 AM EDT Formatting of this note may be different from the original. Kari Lyons, Kari Lyons 03/14/2014 DOB: Feb 07, 1951 Age: 65 y.o. Clinic Note - Cardiovascular Elnora Morrison, PA-C   PRIMARY CARE PROVIDER: Jannifer Hick, MD [address] [place] [address] [phone]  PATIENT PROFILE:  Kari Lyons is a 65 y.o. female who returns for follow up of hypertension  PROBLEM LIST:  Problem List Date Reviewed: 02/06/2014  ICD-9-CM Priority Class Noted - Resolved  Cardiac risk counseling (Chronic) V65.49 02/05/2014 - Present  Overview  Signed 02/05/2014 10:01 AM by Hessie Diener, RN  A. 02/2012: TC 164, Tg 144, HDL 47, LDL 89 on diet alone  PVC's (premature ventricular contractions) (Chronic) 427.69 02/24/2012 - Present  Overview  Addendum 02/05/2014 10:04 AM by Hessie Diener, RN  A. 2009: Reported workup at [facility] including ECHO, Stress Test and Cardiac Cath; records N/A.  ECHO with mild LVH, mild MVP; stress test and cath reportedly normal  Hypertension (Chronic) 401.9 02/24/2012 - Present    MEDICATIONS: Current Outpatient Prescriptions  Medication Sig Dispense Refill  . *enzymes,digestive oral 1 tab by mouth with each meal  . ascorbic Acid (VITAMIN C) 500 mg CpER 1 cap by mouth 3 times a day  . cinnamon bark (CINNAMON) 500 mg capsule Take 1 capsule by mouth 2 (two) times daily.  Marland Kitchen DANDELION ORAL Take 1 capsule by mouth once daily.  Marland Kitchen OLIVE LEAF EXTRACT ORAL Take by mouth 3 (three) times daily.  Marland Kitchen SLIPPERY ELM BARK ORAL Take 1 tsp/30 mL by mouth 4 (four) times daily.   No current facility-administered medications for this visit.   ALLERGIES: Allergies  Allergen Reactions  . Aloe Vera Rash   SOCIAL HISTORY: reports that she has never smoked. She has never used smokeless tobacco. She reports that she does not drink alcohol or use illicit drugs.  FAMILY HISTORY: family  history includes Hypertension in her mother; Parkinsonism in her father.  Subjective:  Kari Lyons is a 65 y.o. female who has hypertension and PVCs who comes in for followup. Background  PVCs onset about a 4 years ago with a completely negative work-up with no records here. Work-up done in [place] by Dr. Humphrey Rolls. 2008 Echocardiogram (outside facility): Mild concentric LVH. Normal LVEF. Mild MR/TR. Mild MVP. 12/2009 Nuclear stress test: Peak blood pressure 210/86. EF 76%. Normal test.  Lipids: 2011 TC 173, TG 110, HDL 58, LDL 93; not on therapy  12/2009: PVC's stable. HTN previously treated with beta blocker, poor tolerance; plan to try Verapamil however patient reports that she was concerned re the possible side effects therefore did not take medication. She reports blood pressures at home generally run systolics [id number], diastolics [id number] on no medical therapy. She denies any CP, PND, orthopnea or edema. No DOE. She does not exercise regularly. Walks some. Family hx of HTN in mother and grandfather with stroke.  Feb 28, 2012: BP 130s over 90s. Copper wiring on my exam and history as noted above. She is ready to address her hypertension and follows a low salt diet already. Started on chlorthalidone 25 milligrams once daily. Returns for followup  Interval hx 2014: Since last visit, medication going well. BP now 110/70s. No orthostatic sx. Taking OTC potassium 99 self-started. External lab faxed to our clinic after last appt with potassium 3.6.  The patient specifically denies chest pressure/discomfort, claudication, dyspnea, fatigue, irregular heart beat and near-syncope  Interval  hx 02/06/14: No major changes to health or medication since last visit in July 2014. She had been started on chlorthalidone and potassium supplements at that time with good control of her BP; however, she stopped both medications due to side effects - chlorthalidone 'washed her out' and made her heart pound, potassium upset  her stomach. Now just taking supplements again. Home BPs were good, ~120s/70s but recently have crept up to 150s/80s. She has known GERD and occasionally experiences chest discomfort related to this. No other types of CP. No dyspnea, orthopnea or PND. Had LE edema in January, attributed to stopping dandelion extract, that has now resolved since she started taking this supplement again. No palpitations, skipped heart beats, presyncope/syncope, claudication, neurologic symptoms.  03/14/14 Would like to take olive leaf extract to naturally lower BP. BP log from home mostly 115-130/70s with few outliers of [id number] systolic.  No chest pain, SOB, palpitations, edema, dizziness, syncope, orthopnea.  states brought in labs from local PCP last visit and viewed by Dr B but not in chart.  Review of Systems:  General. No fever, chills or recent illness, no change in weight, appetite changes, fatigue HEENT. No change in vision or hearing, no C/O pain or difficulty swallowing, voice changes,sensitivity to light, neck stiffness Respiratory. no cough, no shortness of breath, sleep apnea, chest tightness, high pitched wheeze or stridor Cardiovascular. No chest pain or palpitations, PND, orthopnea  GI. No pain, dyspepsia or change in urinary or bowel habits GU. No dysuria, frequency or hesitancy, decreased amount of urine, difficulty urinating Musculoskeletal. no joint pain or injury, no problems walking, muscle aching Neurological. No headaches, changes in mental status, no loss of sensation, strength or function, dizziness, lightheadedness, passing out, tremors, weakness Heme No easy bruising or bleeding Integument No color change, rash Psych Not easily agitated, confused, anxious, depressed  Objective:  BP 148/78 mmHg  Pulse 72  Temp(Src) 36.6 C (97.8 F) (Oral)  Resp 20  Ht 152.4 cm (5')  Wt 83.915 kg (185 lb)  BMI 36.13 kg/m2  SpO2 99%  General Well appearing; no acute distress.  Skin Warm and dry,  without rashes or excessive bruising. HEENT Sclera and conjunctiva clear; normocephalic, atraumatic, face is symmetric at rest.  Neck Supple; no masses; no jugular venous distension or carotid bruits  Lungs Respiration unlabored; clear to auscultation bilaterally. Cardio Regular rate and rhythm without murmurs, rubs, or gallops. Normal S1, S2. Abdomen Soft; non-tender; not distended; normal, active bowel sounds; no masses or organomegaly Musculoskeletal No deformity; appropriate muscle tone; no active joint inflammation Extremities No edema, cyanosis, clubbing or varicosities; peripheral pulses 2+ symmetric Neurologic Alert and oriented; speech intact; face symmetric; moves all extremities well, gait normal  Tests:   None today   Assessment and Plan:   Hypertension. BP better controlled by home log today. Better here in clinic. Brings in article about olive leaf extract, however unknown relationship with BP lowering. Advised to check BP few times weekly and report if SBP >140s more often than not and will start amlodipine 2.5 mg daily. She prefers natural route and will investigate this article for her to ensure no harm is undertaken if she takes this supplement.  PVC's (premature ventricular contractions). Resolved currently.   Disposition:  RTC in 1 year with Dr. Edwin Dada I personally performed the service.   Murrell Redden, PA       Plan of Treatment - as of this encounter   Not on file   Visit Diagnoses - in  this encounter   Diagnosis  Hypertension - Primary  Unspecified essential hypertension   PVC's (premature ventricular contractions)  Other premature beats

## 2016-06-03 NOTE — Patient Instructions (Signed)
  Your procedure is scheduled on: 06-08-16 [facility]) Report to Same Day Surgery 2nd floor medical mall To find out your arrival time please call [phone] between 1PM - 3PM on 06-07-16 (TUESDAY)  Remember: Instructions that are not followed completely may result in serious medical risk, up to and including death, or upon the discretion of your surgeon and anesthesiologist your surgery may need to be rescheduled.    _x___ 1. Do not eat food or drink liquids after midnight. No gum chewing or hard candies.     __x__ 2. No Alcohol for 24 hours before or after surgery.   __x__3. No Smoking for 24 prior to surgery.   ____  4. Bring all medications with you on the day of surgery if instructed.    __x__ 5. Notify your doctor if there is any change in your medical condition     (cold, fever, infections).     Do not wear jewelry, make-up, hairpins, clips or nail polish.  Do not wear lotions, powders, or perfumes. You may wear deodorant.  Do not shave 48 hours prior to surgery. Men may shave face and neck.  Do not bring valuables to the hospital.    [facility] is not responsible for any belongings or valuables.               Contacts, dentures or bridgework may not be worn into surgery.  Leave your suitcase in the car. After surgery it may be brought to your room.  For patients admitted to the hospital, discharge time is determined by your treatment team.   Patients discharged the day of surgery will not be allowed to drive home.    Please read over the following fact sheets that you were given:   [facility] Preparing for Surgery and or MRSA Information   ____ Take these medicines the morning of surgery with A SIP OF WATER:    1. NONE  2.  3.  4.  5.  6.  ____ Fleet Enema (as directed)   _x___ Use CHG Soap or sage wipes as directed on instruction sheet   ____ Use inhalers on the day of surgery and bring to hospital day of surgery  ____ Stop metformin 2 days prior to  surgery    ____ Take 1/2 of usual insulin dose the night before surgery and none on the morning of surgery.   ____ Stop aspirin or coumadin, or plavix  _x__ Stop Anti-inflammatories such as Advil, Aleve, Ibuprofen, Motrin, Naproxen,          Naprosyn, Goodies powders or aspirin products. Ok to take Tylenol.   _X___ Stop supplements until after surgery-STOP VITAMIN C, B COMPLEX, VALERIAN ROOT AND SLIPPERY ELM NOW  ____ Bring C-Pap to the hospital.

## 2016-06-06 ENCOUNTER — Encounter
Admission: RE | Admit: 2016-06-06 | Discharge: 2016-06-06 | Disposition: A | Discharge status: Home / Self Care | Payer: Medicare Other | Source: Ambulatory Visit | Attending: General Surgery | Admitting: General Surgery

## 2016-06-06 DIAGNOSIS — I341 Nonrheumatic mitral (valve) prolapse: Secondary | ICD-10-CM | POA: Diagnosis not present

## 2016-06-06 DIAGNOSIS — Z833 Family history of diabetes mellitus: Secondary | ICD-10-CM | POA: Diagnosis not present

## 2016-06-06 DIAGNOSIS — Z800 Family history of malignant neoplasm of digestive organs: Secondary | ICD-10-CM | POA: Diagnosis not present

## 2016-06-06 DIAGNOSIS — Z87891 Personal history of nicotine dependence: Secondary | ICD-10-CM | POA: Diagnosis not present

## 2016-06-06 DIAGNOSIS — Z8041 Family history of malignant neoplasm of ovary: Secondary | ICD-10-CM | POA: Diagnosis not present

## 2016-06-06 DIAGNOSIS — K589 Irritable bowel syndrome without diarrhea: Secondary | ICD-10-CM | POA: Diagnosis not present

## 2016-06-06 DIAGNOSIS — I10 Essential (primary) hypertension: Secondary | ICD-10-CM | POA: Diagnosis not present

## 2016-06-06 DIAGNOSIS — Z8249 Family history of ischemic heart disease and other diseases of the circulatory system: Secondary | ICD-10-CM | POA: Diagnosis not present

## 2016-06-06 DIAGNOSIS — I499 Cardiac arrhythmia, unspecified: Secondary | ICD-10-CM | POA: Diagnosis not present

## 2016-06-06 DIAGNOSIS — Z8261 Family history of arthritis: Secondary | ICD-10-CM | POA: Diagnosis not present

## 2016-06-06 DIAGNOSIS — Z91048 Other nonmedicinal substance allergy status: Secondary | ICD-10-CM | POA: Diagnosis not present

## 2016-06-06 DIAGNOSIS — Z90722 Acquired absence of ovaries, bilateral: Secondary | ICD-10-CM | POA: Diagnosis not present

## 2016-06-06 DIAGNOSIS — Z90710 Acquired absence of both cervix and uterus: Secondary | ICD-10-CM | POA: Diagnosis not present

## 2016-06-06 DIAGNOSIS — Z820 Family history of epilepsy and other diseases of the nervous system: Secondary | ICD-10-CM | POA: Diagnosis not present

## 2016-06-06 DIAGNOSIS — D241 Benign neoplasm of right breast: Secondary | ICD-10-CM | POA: Diagnosis present

## 2016-06-06 DIAGNOSIS — F329 Major depressive disorder, single episode, unspecified: Secondary | ICD-10-CM | POA: Diagnosis not present

## 2016-06-06 DIAGNOSIS — Z8052 Family history of malignant neoplasm of bladder: Secondary | ICD-10-CM | POA: Diagnosis not present

## 2016-06-06 DIAGNOSIS — K219 Gastro-esophageal reflux disease without esophagitis: Secondary | ICD-10-CM | POA: Diagnosis not present

## 2016-06-06 DIAGNOSIS — Z79899 Other long term (current) drug therapy: Secondary | ICD-10-CM | POA: Diagnosis not present

## 2016-06-06 LAB — POTASSIUM: Potassium: 4.2 mmol/L (ref 3.5–5.1)

## 2016-06-08 ENCOUNTER — Ambulatory Visit: Payer: Medicare Other | Admitting: Certified Registered"

## 2016-06-08 ENCOUNTER — Encounter: Payer: Self-pay | Admitting: *Deleted

## 2016-06-08 ENCOUNTER — Ambulatory Visit
Admission: RE | Admit: 2016-06-08 | Discharge: 2016-06-08 | Disposition: A | Discharge status: Home / Self Care | Payer: Medicare Other | Source: Ambulatory Visit | Attending: General Surgery | Admitting: General Surgery

## 2016-06-08 ENCOUNTER — Encounter
Admission: RE | Disposition: A | Discharge status: Home / Self Care | Payer: Self-pay | Source: Ambulatory Visit | Attending: General Surgery

## 2016-06-08 DIAGNOSIS — Z87891 Personal history of nicotine dependence: Secondary | ICD-10-CM | POA: Insufficient documentation

## 2016-06-08 DIAGNOSIS — D241 Benign neoplasm of right breast: Secondary | ICD-10-CM | POA: Diagnosis not present

## 2016-06-08 DIAGNOSIS — Z79899 Other long term (current) drug therapy: Secondary | ICD-10-CM | POA: Insufficient documentation

## 2016-06-08 DIAGNOSIS — I499 Cardiac arrhythmia, unspecified: Secondary | ICD-10-CM | POA: Insufficient documentation

## 2016-06-08 DIAGNOSIS — K589 Irritable bowel syndrome without diarrhea: Secondary | ICD-10-CM | POA: Insufficient documentation

## 2016-06-08 DIAGNOSIS — Z8052 Family history of malignant neoplasm of bladder: Secondary | ICD-10-CM | POA: Insufficient documentation

## 2016-06-08 DIAGNOSIS — F329 Major depressive disorder, single episode, unspecified: Secondary | ICD-10-CM | POA: Insufficient documentation

## 2016-06-08 DIAGNOSIS — Z90710 Acquired absence of both cervix and uterus: Secondary | ICD-10-CM | POA: Insufficient documentation

## 2016-06-08 DIAGNOSIS — Z91048 Other nonmedicinal substance allergy status: Secondary | ICD-10-CM | POA: Insufficient documentation

## 2016-06-08 DIAGNOSIS — K219 Gastro-esophageal reflux disease without esophagitis: Secondary | ICD-10-CM | POA: Insufficient documentation

## 2016-06-08 DIAGNOSIS — Z820 Family history of epilepsy and other diseases of the nervous system: Secondary | ICD-10-CM | POA: Insufficient documentation

## 2016-06-08 DIAGNOSIS — I10 Essential (primary) hypertension: Secondary | ICD-10-CM | POA: Insufficient documentation

## 2016-06-08 DIAGNOSIS — Z8041 Family history of malignant neoplasm of ovary: Secondary | ICD-10-CM | POA: Insufficient documentation

## 2016-06-08 DIAGNOSIS — N6310 Unspecified lump in the right breast, unspecified quadrant: Secondary | ICD-10-CM

## 2016-06-08 DIAGNOSIS — Z90722 Acquired absence of ovaries, bilateral: Secondary | ICD-10-CM | POA: Insufficient documentation

## 2016-06-08 DIAGNOSIS — Z8261 Family history of arthritis: Secondary | ICD-10-CM | POA: Insufficient documentation

## 2016-06-08 DIAGNOSIS — Z800 Family history of malignant neoplasm of digestive organs: Secondary | ICD-10-CM | POA: Insufficient documentation

## 2016-06-08 DIAGNOSIS — Z8249 Family history of ischemic heart disease and other diseases of the circulatory system: Secondary | ICD-10-CM | POA: Insufficient documentation

## 2016-06-08 DIAGNOSIS — I341 Nonrheumatic mitral (valve) prolapse: Secondary | ICD-10-CM | POA: Insufficient documentation

## 2016-06-08 DIAGNOSIS — Z833 Family history of diabetes mellitus: Secondary | ICD-10-CM | POA: Insufficient documentation

## 2016-06-08 HISTORY — PX: BREAST LUMPECTOMY: SHX2

## 2016-06-08 SURGERY — BREAST LUMPECTOMY
Anesthesia: General | Laterality: Right | Wound class: Clean

## 2016-06-08 MED ORDER — FENTANYL CITRATE (PF) 100 MCG/2ML IJ SOLN
INTRAMUSCULAR | Status: AC
  Filled 2016-06-08: qty 2

## 2016-06-08 MED ORDER — CHLORHEXIDINE GLUCONATE CLOTH 2 % EX PADS: 6.0000 | MEDICATED_PAD | Freq: Once | CUTANEOUS | Status: DC

## 2016-06-08 MED ORDER — ONDANSETRON HCL 4 MG/2ML IJ SOLN
INTRAMUSCULAR | Status: DC | PRN
  Administered 2016-06-08: 4 mg via INTRAVENOUS

## 2016-06-08 MED ORDER — LACTATED RINGERS IV SOLN: INTRAVENOUS | Status: DC

## 2016-06-08 MED ORDER — ACETAMINOPHEN 10 MG/ML IV SOLN
INTRAVENOUS | Status: AC
  Filled 2016-06-08: qty 100

## 2016-06-08 MED ORDER — DEXAMETHASONE SODIUM PHOSPHATE 10 MG/ML IJ SOLN
INTRAMUSCULAR | Status: DC | PRN
  Administered 2016-06-08: 4 mg via INTRAVENOUS

## 2016-06-08 MED ORDER — FAMOTIDINE 20 MG PO TABS
ORAL_TABLET | ORAL | Status: AC
  Administered 2016-06-08: 20 mg via ORAL
  Filled 2016-06-08: qty 1

## 2016-06-08 MED ORDER — EPHEDRINE SULFATE 50 MG/ML IJ SOLN
INTRAMUSCULAR | Status: DC | PRN
  Administered 2016-06-08 (×3): 5 mg via INTRAVENOUS

## 2016-06-08 MED ORDER — PROPOFOL 10 MG/ML IV BOLUS
INTRAVENOUS | Status: DC | PRN
  Administered 2016-06-08: 160 mg via INTRAVENOUS

## 2016-06-08 MED ORDER — FENTANYL CITRATE (PF) 100 MCG/2ML IJ SOLN
INTRAMUSCULAR | Status: DC | PRN
  Administered 2016-06-08 (×2): 25 ug via INTRAVENOUS

## 2016-06-08 MED ORDER — BUPIVACAINE HCL (PF) 0.5 % IJ SOLN
INTRAMUSCULAR | Status: AC
  Filled 2016-06-08: qty 30

## 2016-06-08 MED ORDER — LIDOCAINE HCL (CARDIAC) 20 MG/ML IV SOLN
INTRAVENOUS | Status: DC | PRN
  Administered 2016-06-08: 100 mg via INTRAVENOUS

## 2016-06-08 MED ORDER — METHYLENE BLUE 0.5 % INJ SOLN
INTRAVENOUS | Status: AC
  Filled 2016-06-08: qty 10

## 2016-06-08 MED ORDER — FENTANYL CITRATE (PF) 100 MCG/2ML IJ SOLN: 25.0000 ug | INTRAMUSCULAR | Status: DC | PRN

## 2016-06-08 MED ORDER — TRAMADOL HCL 50 MG PO TABS: 50.0000 mg | ORAL_TABLET | Freq: Four times a day (QID) | ORAL | 0 refills | Status: DC | PRN

## 2016-06-08 MED ORDER — FENTANYL CITRATE (PF) 100 MCG/2ML IJ SOLN
25.0000 ug | INTRAMUSCULAR | Status: DC | PRN
  Administered 2016-06-08 (×3): 50 ug via INTRAVENOUS

## 2016-06-08 MED ORDER — PHENYLEPHRINE HCL 10 MG/ML IJ SOLN
INTRAMUSCULAR | Status: DC | PRN
  Administered 2016-06-08: 50 ug via INTRAVENOUS

## 2016-06-08 MED ORDER — FAMOTIDINE 20 MG PO TABS
20.0000 mg | ORAL_TABLET | Freq: Once | ORAL | Status: AC
  Administered 2016-06-08: 20 mg via ORAL

## 2016-06-08 MED ORDER — OXYCODONE HCL 5 MG/5ML PO SOLN: 5.0000 mg | Freq: Once | ORAL | Status: DC | PRN

## 2016-06-08 MED ORDER — LACTATED RINGERS IV SOLN
INTRAVENOUS | Status: DC | PRN
  Administered 2016-06-08: 13:00:00 via INTRAVENOUS

## 2016-06-08 MED ORDER — SODIUM CHLORIDE 0.9 % IJ SOLN
INTRAMUSCULAR | Status: AC
  Filled 2016-06-08: qty 10

## 2016-06-08 MED ORDER — KETOROLAC TROMETHAMINE 30 MG/ML IJ SOLN
INTRAMUSCULAR | Status: DC | PRN
  Administered 2016-06-08: 15 mg via INTRAVENOUS

## 2016-06-08 MED ORDER — OXYCODONE HCL 5 MG PO TABS: 5.0000 mg | ORAL_TABLET | Freq: Once | ORAL | Status: DC | PRN

## 2016-06-08 MED ORDER — MIDAZOLAM HCL 2 MG/2ML IJ SOLN
INTRAMUSCULAR | Status: DC | PRN
  Administered 2016-06-08: 2 mg via INTRAVENOUS

## 2016-06-08 MED ORDER — BUPIVACAINE HCL (PF) 0.5 % IJ SOLN
INTRAMUSCULAR | Status: DC | PRN
  Administered 2016-06-08: 10 mL

## 2016-06-08 MED ORDER — ACETAMINOPHEN 10 MG/ML IV SOLN
INTRAVENOUS | Status: DC | PRN
  Administered 2016-06-08: 1000 mg via INTRAVENOUS

## 2016-06-08 SURGICAL SUPPLY — 30 items
BLADE SURG 15 STRL SS SAFETY (BLADE) ×6 IMPLANT
BULB RESERV EVAC DRAIN JP 100C (MISCELLANEOUS) IMPLANT
CANISTER SUCT 1200ML W/VALVE (MISCELLANEOUS) ×3 IMPLANT
CHLORAPREP W/TINT 26ML (MISCELLANEOUS) ×3 IMPLANT
CNTNR SPEC 2.5X3XGRAD LEK (MISCELLANEOUS) ×1
CONT SPEC 4OZ STER OR WHT (MISCELLANEOUS) ×2
CONTAINER SPEC 2.5X3XGRAD LEK (MISCELLANEOUS) ×1 IMPLANT
COVER PROBE FLX POLY STRL (MISCELLANEOUS) ×3 IMPLANT
DEVICE LOCALIZATION ULTRAWIRE (WIRE) ×1 IMPLANT
DRAIN CHANNEL JP 15F RND 16 (MISCELLANEOUS) IMPLANT
DRAPE LAPAROTOMY TRNSV 106X77 (MISCELLANEOUS) ×3 IMPLANT
ELECT REM PT RETURN 9FT ADLT (ELECTROSURGICAL) ×3
ELECTRODE REM PT RTRN 9FT ADLT (ELECTROSURGICAL) ×1 IMPLANT
GLOVE BIO SURGEON STRL SZ7 (GLOVE) ×15 IMPLANT
GOWN STRL REUS W/ TWL LRG LVL3 (GOWN DISPOSABLE) ×3 IMPLANT
GOWN STRL REUS W/TWL LRG LVL3 (GOWN DISPOSABLE) ×6
HARMONIC SCALPEL FOCUS (MISCELLANEOUS) IMPLANT
KIT RM TURNOVER STRD PROC AR (KITS) ×3 IMPLANT
LABEL OR SOLS (LABEL) ×3 IMPLANT
LIQUID BAND (GAUZE/BANDAGES/DRESSINGS) ×3 IMPLANT
MARGIN MAP 10MM (MISCELLANEOUS) IMPLANT
NEEDLE HYPO 25X1 1.5 SAFETY (NEEDLE) ×3 IMPLANT
PACK BASIN MINOR ARMC (MISCELLANEOUS) ×3 IMPLANT
SUT ETH BLK MONO 3 0 FS 1 12/B (SUTURE) ×3 IMPLANT
SUT MNCRL AB 3-0 PS2 27 (SUTURE) ×3 IMPLANT
SUT VIC AB 2-0 BRD 54 (SUTURE) ×3 IMPLANT
SUT VIC AB 2-0 CT2 27 (SUTURE) ×3 IMPLANT
SYR CONTROL 10ML (SYRINGE) ×3 IMPLANT
ULTRAWIRE LOCALIZATION DEVICE (WIRE) ×3
WATER STERILE IRR 1000ML POUR (IV SOLUTION) ×3 IMPLANT

## 2016-06-08 NOTE — Interval H&P Note (Signed)
History and Physical Interval Note:  06/08/2016 12:14 PM  Kari Lyons  has presented today for surgery, with the diagnosis of BENIGN NEOPLASM OF RIGHT BREAST  The various methods of treatment have been discussed with the patient and family. After consideration of risks, benefits and other options for treatment, the patient has consented to  Procedure(s): BREAST LUMPECTOMY WITH ULTRASOUND IN O.R (Right) as a surgical intervention .  The patient's history has been reviewed, patient examined, no change in status, stable for surgery.  I have reviewed the patient's chart and labs.  Questions were answered to the patient's satisfaction.     SANKAR,SEEPLAPUTHUR G

## 2016-06-08 NOTE — Progress Notes (Signed)
No pain on discharge

## 2016-06-08 NOTE — Anesthesia Postprocedure Evaluation (Signed)
Anesthesia Post Note  Patient: Kari Lyons  Procedure(s) Performed: Procedure(s) (LRB): BREAST LUMPECTOMY WITH ULTRASOUND IN O.R (Right)  Patient location during evaluation: PACU Anesthesia Type: General Level of consciousness: awake and alert Pain management: pain level controlled Vital Signs Assessment: post-procedure vital signs reviewed and stable Respiratory status: spontaneous breathing, nonlabored ventilation, respiratory function stable and patient connected to nasal cannula oxygen Cardiovascular status: blood pressure returned to baseline and stable Postop Assessment: no signs of nausea or vomiting Anesthetic complications: no    Last Vitals:  Vitals:   06/08/16 1206 06/08/16 1338  BP: (!) 143/80 133/68  Pulse: 70 72  Resp: 16 17  Temp: 36.6 C 36.2 C    Last Pain:  Vitals:   06/08/16 1415  TempSrc:   PainSc: 2                  [name]

## 2016-06-08 NOTE — Anesthesia Preprocedure Evaluation (Signed)
Anesthesia Evaluation  Patient identified by MRN, date of birth, ID band Patient awake    Reviewed: Allergy & Precautions, H&P , NPO status , Patient's Chart, lab work & pertinent test results  History of Anesthesia Complications Negative for: history of anesthetic complications  Airway Mallampati: III  TM Distance: >3 FB Neck ROM: full    Dental no notable dental hx. (+) Poor Dentition, Chipped   Pulmonary neg shortness of breath, sleep apnea and Continuous Positive Airway Pressure Ventilation ,    Pulmonary exam normal breath sounds clear to auscultation       Cardiovascular Exercise Tolerance: Good hypertension, (-) angina(-) Past MI and (-) DOE Normal cardiovascular exam+ dysrhythmias  Rhythm:regular Rate:Normal     Neuro/Psych PSYCHIATRIC DISORDERS Depression negative neurological ROS     GI/Hepatic Neg liver ROS, GERD  Controlled,  Endo/Other  negative endocrine ROS  Renal/GU      Musculoskeletal  (+) Arthritis ,   Abdominal   Peds  Hematology  (+) JEHOVAH'S WITNESS  Anesthesia Other Findings Past Medical History: No date: Anemia     Comment: H/O  2008: Arrhythmia No date: Arthritis 02/24/2012: Beat, premature ventricular     Comment: Overview:  A. 2009: Reported workup at               [place] including ECHO, Stress Test and               Cardiac Cath; records N/A.  ECHO with mild LVH,              mild MVP; stress test and cath reportedly               normal  No date: Depression No date: Edema No date: GERD (gastroesophageal reflux disease) No date: Hypertension No date: IBS (irritable bowel syndrome) No date: Mitral valve prolapse No date: Patient is Jehovah's Witness No date: Sleep apnea     Comment: MILD-NO CPAP   Past Surgical History: 1985: ABDOMINAL HYSTERECTOMY 2002: BILATERAL SALPINGOOPHORECTOMY     Comment: due to fh of ovarian ca 02-26-13: BREAST SURGERY Left     Comment:  subareloar duct excision and mass 2012: COLONOSCOPY 1985: EXCISION / BIOPSY BREAST / NIPPLE / DUCT Right     Comment: negative 02/26/2013: EXCISION / BIOPSY BREAST / NIPPLE / DUCT Left     Comment: intraductal papilloma No date: TONSILLECTOMY     Reproductive/Obstetrics negative OB ROS                             Anesthesia Physical Anesthesia Plan  ASA: III  Anesthesia Plan: General LMA   Post-op Pain Management:    Induction:   Airway Management Planned:   Additional Equipment:   Intra-op Plan:   Post-operative Plan:   Informed Consent: I have reviewed the patients History and Physical, chart, labs and discussed the procedure including the risks, benefits and alternatives for the proposed anesthesia with the patient or authorized representative who has indicated his/her understanding and acceptance.     Plan Discussed with: Anesthesiologist, CRNA and Surgeon  Anesthesia Plan Comments:         Anesthesia Quick Evaluation

## 2016-06-08 NOTE — Op Note (Signed)
Preop diagnosis: Right breast mass  Post op diagnosis: Same  Operation: Excision right breast mass with ultrasound-guided wire localization  Surgeon: Mckinley Jewel  Assistant:     Anesthesia: Gen.  Complications: None  EBL: Minimal  Drains: None  Description: Patient was placed supine on the operating table and put to sleep with an LMA. Right breast was prepped  and draped as sterile field and timeout performed. Patient had a small irregular mass located medial to the right nipple 3 cm distance at 3:00 location. Previously this was core biopsied and showed a sclerotic Papilloma. 3 month follow-up revealed that the mass was still there and slightly larger and therefore excision was planned. Ultrasound probe was brought up to the field and with the adequate visualization a Bard ultra wire was positioned going through the lesion. A circumareolar incision made along along the medial margin of the areola. The wire was freed and using that as a guide a core tissue was excised out completely surrounding it. A small firm area was identified on palpation within this excised specimen which was sent to pathology routine processing. Hemostasis obtained with cautery and 10 mL of 0.5% Marcaine was instilled for postop analgesia. The deeper layer closed with interrupted 2-0 Vicryl. Skin closed with subcuticular 3-0 Monocryl covered with liqui  ban. Patient tolerated the procedure well with no immediate problems encountered and was then sent to recovery room in stable condition.

## 2016-06-08 NOTE — H&P (View-Only) (Signed)
Patient ID: Kari Lyons, female   DOB: 10/21/1950, 65 y.o.   MRN: [id number]  Chief Complaint  Patient presents with  . Follow-up    right breast     HPI Kari Lyons is a 65 y.o. female here today for her follow up of a  right breast biopsy done on 03/08/2016. 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 Patient states she is doing well.  I have reviewed the history of present illness with the patient.  HPI  Past Medical History:  Diagnosis Date  . Arrhythmia 2008  . 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 2009: Reported workup at [place] including ECHO, Stress Test and Cardiac Cath; records N/A.  ECHO with mild LVH, mild MVP; stress test and cath reportedly normal   . Depression   . Edema   . GERD (gastroesophageal reflux disease)   . Hypertension   . IBS (irritable bowel syndrome)   . Mitral valve prolapse   . Patient is Jehovah's Witness     Past Surgical History:  Procedure Laterality Date  . ABDOMINAL HYSTERECTOMY  1985  . BILATERAL SALPINGOOPHORECTOMY  2002   due to fh of ovarian ca  . BREAST SURGERY Left 02-26-13   subareloar duct excision and mass  . COLONOSCOPY  2012  . EXCISION / BIOPSY BREAST / NIPPLE / DUCT Right 1985   negative  . EXCISION / BIOPSY BREAST / NIPPLE / DUCT Left 02/26/2013   intraductal papilloma  . TONSILECTOMY, ADENOIDECTOMY, BILATERAL MYRINGOTOMY AND TUBES      Family History  Problem Relation Age of Onset  . Hypertension Mother   . Arthritis Mother   . Parkinson's disease Father   . Hypertension Sister   . Heart disease Daughter   . Cancer Sister 48    ovarian ca  . Cancer Brother     multiple myeloma  . Diabetes Brother   . Cancer Maternal Grandmother     colon ca  . Breast cancer Paternal Grandmother 32  . Bladder Cancer Paternal Uncle   . Prostate cancer Neg Hx   . Kidney cancer Neg Hx     Social History Social History    Substance Use Topics  . Smoking status: Former Smoker  . Smokeless tobacco: Never Used     Comment: smoked for 3months when 65yrs old  . Alcohol use No    Allergies  Allergen Reactions  . Aloe Rash  . Aloe Vera Rash    Current Outpatient Prescriptions  Medication Sig Dispense Refill  . Ascorbic Acid (VITAMIN C) 100 MG tablet Take 100 mg by mouth daily.    . Bearberry, Uva-Ursi, (UVA URSI PO) Take 500 mg by mouth 3 (three) times daily. Reported on 03/11/2016    . Cholecalciferol (VITAMIN D3) 2000 UNITS TABS Take 2,000 Units by mouth daily. Reported on 03/11/2016    . Chromium 200 MCG TABS Take 1 tablet by mouth 2 (two) times daily. Reported on 03/11/2016    . CINNAMON PO Take 1,000 tablets by mouth 2 (two) times daily. Reported on 03/11/2016    . Fenugreek 610 MG CAPS Take 1 capsule by mouth daily. Reported on 03/11/2016    . Ginger, Zingiber officinalis, (GINGER PO) Reported on 03/11/2016    . HAWTHORN BERRIES PO Take 150 mg by mouth 3 (three) times daily. Reported on 03/11/2016    . Olive Leaf Extract 250 MG CAPS Take by mouth. Reported   on 03/11/2016    . OVER THE COUNTER MEDICATION Take 2 capsules by mouth at bedtime. Valarian Root    . OVER THE COUNTER MEDICATION Fennel 1 teaspoon by mouth daily.    . Potassium 99 MG TABS Take 1 tablet by mouth daily. Reported on 03/11/2016    . triamterene-hydrochlorothiazide (MAXZIDE-25) 37.5-25 MG tablet Take 1 tablet by mouth daily. 90 tablet 3   No current facility-administered medications for this visit.     Review of Systems Review of Systems  Constitutional: Negative.   Respiratory: Negative.   Cardiovascular: Negative.     Blood pressure 124/76, pulse 70, resp. rate 12, height 5' 5" (1.651 m), weight 171 lb (77.6 kg).  Focused  Physical Exam Physical Exam  Constitutional: She is oriented to person, place, and time. She appears well-developed and well-nourished.  Eyes: Conjunctivae are normal. No scleral icterus.  Neck: Neck supple.   Cardiovascular: Normal rate and normal heart sounds.   Pulmonary/Chest: Effort normal and breath sounds normal. No respiratory distress. Right breast exhibits inverted nipple. Right breast exhibits no mass, no nipple discharge, no skin change and no tenderness.    Lymphadenopathy:    She has no cervical adenopathy.    She has no axillary adenopathy.  Neurological: She is alert and oriented to person, place, and time.  Skin: Skin is warm and dry.    Data Reviewed Prior notes, mammogram reviewed Focused ultrasound-: Irregular, vague right breast mass, hypoechoic 3cm from nipple at 3OCL still present. 1.08 cm at its largest measurement.   Assessment    Right breast mass - Slightly larger from last visit. Excision recommended.     Plan      Patient to be scheduled for excision right breast mass. Pt is agreeable. The patient is scheduled for surgery at [facility] on 06/08/16. She will pre admit by phone, the patient is aware of date and instructions.   This information has been scribed by Jessica Qualls CMA.   SANKAR,SEEPLAPUTHUR G 06/01/2016, 1:03 PM   

## 2016-06-08 NOTE — Discharge Instructions (Signed)
AMBULATORY SURGERY  °DISCHARGE INSTRUCTIONS ° ° °1) The drugs that you were given will stay in your system until tomorrow so for the next 24 hours you should not: ° °A) Drive an automobile °B) Make any legal decisions °C) Drink any alcoholic beverage ° ° °2) You may resume regular meals tomorrow.  Today it is better to start with liquids and gradually work up to solid foods. ° °You may eat anything you prefer, but it is better to start with liquids, then soup and crackers, and gradually work up to solid foods. ° ° °3) Please notify your doctor immediately if you have any unusual bleeding, trouble breathing, redness and pain at the surgery site, drainage, fever, or pain not relieved by medication. ° ° ° °4) Additional Instructions: ° ° ° ° ° ° ° °Please contact your physician with any problems or Same Day Surgery at [phone], Monday through Friday 6 am to 4 pm, or [place] at [place] Main number at [phone].AMBULATORY SURGERY  °DISCHARGE INSTRUCTIONS ° ° °5) The drugs that you were given will stay in your system until tomorrow so for the next 24 hours you should not: ° °D) Drive an automobile °E) Make any legal decisions °F) Drink any alcoholic beverage ° ° °6) You may resume regular meals tomorrow.  Today it is better to start with liquids and gradually work up to solid foods. ° °You may eat anything you prefer, but it is better to start with liquids, then soup and crackers, and gradually work up to solid foods. ° ° °7) Please notify your doctor immediately if you have any unusual bleeding, trouble breathing, redness and pain at the surgery site, drainage, fever, or pain not relieved by medication. ° ° ° °8) Additional Instructions: ° ° ° ° ° ° ° °Please contact your physician with any problems or Same Day Surgery at [phone], Monday through Friday 6 am to 4 pm, or [place] at [place] Main number at [phone]. °

## 2016-06-08 NOTE — Anesthesia Procedure Notes (Signed)
Procedure Name: LMA Insertion Performed by: Logyn Kendrick Pre-anesthesia Checklist: Patient identified, Patient being monitored, Timeout performed, Emergency Drugs available and Suction available Patient Re-evaluated:Patient Re-evaluated prior to inductionOxygen Delivery Method: Circle system utilized Preoxygenation: Pre-oxygenation with 100% oxygen Intubation Type: IV induction LMA: LMA inserted LMA Size: 3.0 Tube type: Oral Number of attempts: 1 Placement Confirmation: positive ETCO2 and breath sounds checked- equal and bilateral Tube secured with: Tape Dental Injury: Teeth and Oropharynx as per pre-operative assessment        

## 2016-06-08 NOTE — Transfer of Care (Signed)
Immediate Anesthesia Transfer of Care Note  Patient: Kari Lyons  Procedure(s) Performed: Procedure(s): BREAST LUMPECTOMY WITH ULTRASOUND IN O.R (Right)  Patient Location: PACU  Anesthesia Type:General  Level of Consciousness: sedated and responds to stimulation  Airway & Oxygen Therapy: Patient Spontanous Breathing and Patient connected to face mask oxygen  Post-op Assessment: Report given to RN and Post -op Vital signs reviewed and stable  Post vital signs: Reviewed and stable  Last Vitals:  Vitals:   06/08/16 1206 06/08/16 1338  BP: (!) 143/80 133/68  Pulse: 70 72  Resp: 16 17  Temp: 36.6 C     Last Pain:  Vitals:   06/08/16 1206  TempSrc: Oral  PainSc: 0-No pain         Complications: No apparent anesthesia complications

## 2016-06-09 ENCOUNTER — Telehealth: Payer: Self-pay | Admitting: *Deleted

## 2016-06-09 HISTORY — PX: EXCISION / BIOPSY BREAST / NIPPLE / DUCT: SUR469

## 2016-06-09 LAB — SURGICAL PATHOLOGY

## 2016-06-09 NOTE — Telephone Encounter (Signed)
-----   Message from Christene Lye, MD sent at 06/09/2016  4:49 PM EDT ----- Rosann Auerbach, please let pt pt know the pathology was normal-same as before-intraductal papilloma

## 2016-06-09 NOTE — Telephone Encounter (Signed)
Notified patient as instructed, patient pleased. Discussed follow-up appointment next week, patient agrees

## 2016-06-10 ENCOUNTER — Telehealth: Payer: Self-pay

## 2016-06-10 NOTE — Telephone Encounter (Signed)
Patient called concerned about surgical site. She states that she has had a little bleeding and some bruising under the skin. I advised her to use Ice pack to the area and monitor for any additional bleeding. She denies any pain or swelling to the area. The patient is aware to call back if her bleeding continues or gets any worse.

## 2016-06-13 ENCOUNTER — Encounter: Payer: Self-pay | Admitting: *Deleted

## 2016-06-16 ENCOUNTER — Ambulatory Visit (INDEPENDENT_AMBULATORY_CARE_PROVIDER_SITE_OTHER): Payer: Medicare Other | Admitting: General Surgery

## 2016-06-16 ENCOUNTER — Encounter: Payer: Self-pay | Admitting: General Surgery

## 2016-06-16 VITALS — BP 140/82 | Ht 65.0 in | Wt 180.0 lb

## 2016-06-16 DIAGNOSIS — D369 Benign neoplasm, unspecified site: Secondary | ICD-10-CM

## 2016-06-16 NOTE — Progress Notes (Signed)
Patient ID: Kari Lyons, female   DOB: October 19, 1950, 65 y.o.   MRN: [id number]  Chief Complaint  Patient presents with  . Routine Post Op    Right breast biopsy    HPI Kari Lyons is a 65 y.o. female here today for 1 week post op right breast biopsy done 06/08/16. Patient states she is doing well, still tender and some drainage.  I have reviewed the history of present illness with the patient.  HPI  Past Medical History:  Diagnosis Date  . Anemia    H/O   . Arrhythmia 2008  . Arthritis   . 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 2009: Reported workup at [place] including ECHO, Stress Test and Cardiac Cath; records N/A.  ECHO with mild LVH, mild MVP; stress test and cath reportedly normal   . Depression   . Edema   . GERD (gastroesophageal reflux disease)   . Hypertension   . IBS (irritable bowel syndrome)   . Mitral valve prolapse   . Patient is Jehovah's Witness   . Sleep apnea    MILD-NO CPAP     Past Surgical History:  Procedure Laterality Date  . ABDOMINAL HYSTERECTOMY  1985  . BILATERAL SALPINGOOPHORECTOMY  2002   due to fh of ovarian ca  . BREAST LUMPECTOMY Right 06/08/2016   Procedure: BREAST LUMPECTOMY WITH ULTRASOUND IN O.R;  Surgeon: Christene Lye, MD;  Location: [facility] ORS;  Service: General;  Laterality: Right;  . BREAST SURGERY Left 02-26-13   subareloar duct excision and mass  . COLONOSCOPY  2012  . EXCISION / BIOPSY BREAST / NIPPLE / DUCT Right 1985   negative  . EXCISION / BIOPSY BREAST / NIPPLE / DUCT Left 02/26/2013   intraductal papilloma  . TONSILLECTOMY      Family History  Problem Relation Age of Onset  . Hypertension Mother   . Arthritis Mother   . Parkinson's disease Father   . Hypertension Sister   . Heart disease Daughter   . Cancer Sister 23    ovarian ca  . Cancer Brother     multiple myeloma  . Diabetes Brother   . Cancer Maternal Grandmother     colon ca  . Breast cancer Paternal Grandmother 60  .  Bladder Cancer Paternal Uncle   . Prostate cancer Neg Hx   . Kidney cancer Neg Hx     Social History Social History  Substance Use Topics  . Smoking status: Never Smoker  . Smokeless tobacco: Never Used     Comment: smoked for 79month when 65yrold  . Alcohol use No    Allergies  Allergen Reactions  . Aloe Vera Rash    Current Outpatient Prescriptions  Medication Sig Dispense Refill  . Ascorbic Acid (VITAMIN C) 100 MG tablet Take 100 mg by mouth daily.    . Marland Kitchen complex vitamins capsule Take 1 capsule by mouth daily.    . Marland KitchenVER THE COUNTER MEDICATION Take 2 capsules by mouth at bedtime. Valarian Root    . triamterene-hydrochlorothiazide (MAXZIDE-25) 37.5-25 MG tablet Take 1 tablet by mouth daily. 90 tablet 3   No current facility-administered medications for this visit.     Review of Systems Review of Systems  Constitutional: Negative.   Respiratory: Negative.   Cardiovascular: Negative.     Blood pressure 140/82, height 5' 5" (1.651 m), weight 180 lb (81.6 kg).  Physical Exam Physical Exam  Constitutional: She is oriented to person, place, and time.  She appears well-developed and well-nourished.  Pulmonary/Chest: Right breast exhibits tenderness. Right breast exhibits no inverted nipple, no mass, no nipple discharge and no skin change.    Incision healing well  Neurological: She is alert and oriented to person, place, and time.  Skin: Skin is warm and dry.    Data Reviewed Pathology  Assessment    Intraductal papilloma     Plan    Plan to follow up in 2 months.        Kari Lyons 06/16/2016, 10:54 AM

## 2016-06-16 NOTE — Patient Instructions (Signed)
Follow up in 2 months

## 2016-06-22 ENCOUNTER — Ambulatory Visit: Payer: Medicare Other

## 2016-07-12 ENCOUNTER — Ambulatory Visit
Admission: RE | Admit: 2016-07-12 | Discharge: 2016-07-12 | Disposition: A | Discharge status: Home / Self Care | Payer: Medicare Other | Source: Ambulatory Visit | Attending: Internal Medicine | Admitting: Internal Medicine

## 2016-07-12 DIAGNOSIS — Z780 Asymptomatic menopausal state: Secondary | ICD-10-CM | POA: Diagnosis not present

## 2016-08-11 ENCOUNTER — Encounter: Payer: Self-pay | Admitting: Family Medicine

## 2016-08-11 ENCOUNTER — Ambulatory Visit (INDEPENDENT_AMBULATORY_CARE_PROVIDER_SITE_OTHER): Payer: Medicare Other | Admitting: Family Medicine

## 2016-08-11 ENCOUNTER — Ambulatory Visit: Payer: Medicare Other | Admitting: Family Medicine

## 2016-08-11 VITALS — BP 164/94 | HR 71 | Temp 98.3°F | Wt 186.5 lb

## 2016-08-11 DIAGNOSIS — R1013 Epigastric pain: Secondary | ICD-10-CM | POA: Diagnosis not present

## 2016-08-11 DIAGNOSIS — Z8711 Personal history of peptic ulcer disease: Secondary | ICD-10-CM | POA: Insufficient documentation

## 2016-08-11 DIAGNOSIS — Z8719 Personal history of other diseases of the digestive system: Secondary | ICD-10-CM | POA: Insufficient documentation

## 2016-08-11 LAB — COMPREHENSIVE METABOLIC PANEL
ALT: 30 U/L (ref 0–35)
AST: 27 U/L (ref 0–37)
Albumin: 4.4 g/dL (ref 3.5–5.2)
Alkaline Phosphatase: 64 U/L (ref 39–117)
BUN: 12 mg/dL (ref 6–23)
CO2: 30 mEq/L (ref 19–32)
Calcium: 9.9 mg/dL (ref 8.4–10.5)
Chloride: 103 mEq/L (ref 96–112)
Creatinine, Ser: 0.77 mg/dL (ref 0.40–1.20)
GFR: 96.38 mL/min (ref >60.00)
Glucose, Bld: 93 mg/dL (ref 70–99)
Potassium: 4 mEq/L (ref 3.5–5.1)
Sodium: 140 mEq/L (ref 135–145)
Total Bilirubin: 0.7 mg/dL (ref 0.2–1.2)
Total Protein: 7.3 g/dL (ref 6.0–8.3)

## 2016-08-11 LAB — LIPASE: Lipase: 32 U/L (ref 11.0–59.0)

## 2016-08-11 LAB — CBC
HCT: 38 % (ref 36.0–46.0)
Hemoglobin: 12.8 g/dL (ref 12.0–15.0)
MCHC: 33.7 g/dL (ref 30.0–36.0)
MCV: 86.3 fl (ref 78.0–100.0)
Platelets: 206 10*3/uL (ref 150.0–400.0)
RBC: 4.4 Mil/uL (ref 3.87–5.11)
RDW: 13.7 % (ref 11.5–15.5)
WBC: 4.8 10*3/uL (ref 4.0–10.5)

## 2016-08-11 NOTE — Assessment & Plan Note (Signed)
New problem. Unclear etiology at this time.  With associated back pain, nausea, bloating. I suspect that her symptoms are primarily from IBS. Likely a component of reflux as well. Advised over-the-counter Prilosec. Obtaining laboratory studies for further evaluation today.

## 2016-08-11 NOTE — Patient Instructions (Signed)
This is likely from IBS.  We will call with your lab results.  Take OTC Prilosec.  Follow up closely with Dr. Derrel Nip  Take care  Dr. Lacinda Axon

## 2016-08-11 NOTE — Progress Notes (Signed)
Subjective:  Patient ID: Kari Lyons, female    DOB: [birth year]  Age: 65 y.o. MRN: [id number]  CC: Abdominal pain, bloating, dizziness  HPI:  65 year old female with a history of IBS presents with the above complaints.  Patient states that since last Thursday she's not been feeling well. She's had epigastric pain radiating to the back, abdominal bloating. She reports some associated nausea. No vomiting. She has been eating without difficulty. Additionally, she states that she's had some dizziness and has felt faint. She has also reported that she feels like her food is not being digested. She states that she sees parts of whole food in her stool (turnip greens per her report). She states that she thought that this could be coming from her blood pressure medication as it does have a reported side effect of pancreatitis. She has stopped her blood pressure medication as a result of this. She is having regular bowel movements. No reports of hematochezia or melena. No other associated symptoms. No known relieving factors. No known exacerbating factors. No other complaints at this time.  Social Hx   Social History   Social History  . Marital status: Married    Spouse name: N/A  . Number of children: N/A  . Years of education: N/A   Social History Main Topics  . Smoking status: Never Smoker  . Smokeless tobacco: Never Used     Comment: smoked for 32month when 65yrold  . Alcohol use No  . Drug use: No  . Sexual activity: Not Asked   Other Topics Concern  . None   Social History Narrative  . None    Review of Systems  Gastrointestinal: Positive for abdominal pain and nausea. Negative for blood in stool and vomiting.       Bloating.  Neurological: Positive for dizziness.    Objective:  BP (!) 164/94 (BP Location: Right Arm, Patient Position: Sitting, Cuff Size: Large)   Pulse 71   Temp 98.3 F (36.8 C) (Oral)   Wt 186 lb 8 oz (84.6 kg)   SpO2 96%   BMI 31.04 kg/m    BP/Weight 08/11/2016 06/16/2016 05/17/65/0600Systolic BP [id number] BP 94 82 70  Wt. (Lbs) 186.5 180 -  BMI 31.04 29.95 -    Physical Exam  Constitutional: No distress.  Cardiovascular: Normal rate and regular rhythm.   Pulmonary/Chest: Effort normal. She has no wheezes. She has no rales.  Abdominal:  Soft. Nondistended. Mildly tender to palpation epigastric region.  Neurological: She is alert.  Psychiatric:  Flat affect.   Vitals reviewed.  Lab Results  Component Value Date   WBC 3.8 (L) 03/11/2016   HGB 13.2 03/11/2016   HCT 40.4 03/11/2016   PLT 198.0 03/11/2016   GLUCOSE 93 04/05/2016   CHOL 112 03/11/2016   TRIG 70.0 03/11/2016   HDL 40.10 03/11/2016   LDLCALC 58 03/11/2016   ALT 15 03/11/2016   AST 21 03/11/2016   NA 138 04/05/2016   K 4.2 06/06/2016   CL 103 04/05/2016   CREATININE 0.74 04/05/2016   BUN 9 04/05/2016   CO2 31 04/05/2016   TSH 1.13 03/11/2016   MICROALBUR 0.8 08/14/2015    Assessment & Plan:   Problem List Items Addressed This Visit    Abdominal pain, epigastric - Primary    New problem. Unclear etiology at this time.  With associated back pain, nausea, bloating. I suspect that her symptoms are primarily from IBS. Likely a component  of reflux as well. Advised over-the-counter Prilosec. Obtaining laboratory studies for further evaluation today.       Relevant Orders   CBC   Comp Met (CMET)   Lipase    Other Visit Diagnoses   None.    Follow-up: PRN  [place]

## 2016-08-11 NOTE — Progress Notes (Signed)
Pre visit review using our clinic review tool, if applicable. No additional management support is needed unless otherwise documented below in the visit note. 

## 2016-08-16 ENCOUNTER — Telehealth: Payer: Self-pay

## 2016-08-16 ENCOUNTER — Encounter: Payer: Self-pay | Admitting: General Surgery

## 2016-08-16 ENCOUNTER — Ambulatory Visit (INDEPENDENT_AMBULATORY_CARE_PROVIDER_SITE_OTHER): Payer: Medicare Other | Admitting: General Surgery

## 2016-08-16 VITALS — BP 120/68 | HR 74 | Resp 12 | Ht 65.0 in | Wt 180.0 lb

## 2016-08-16 DIAGNOSIS — R1011 Right upper quadrant pain: Secondary | ICD-10-CM

## 2016-08-16 DIAGNOSIS — D369 Benign neoplasm, unspecified site: Secondary | ICD-10-CM

## 2016-08-16 NOTE — Telephone Encounter (Signed)
PT CALLED BACK & WAS GIVEN INFORMATION ON HER APPT SCHEDULED GB U/S FOR 08-26-16 @ [place].NPO AFTER MIDNIGHT.I LET PATIENT KNOW CARY-LYN WOULD CALL HER TOMORROW & GO BACK OVER INFORMATION.

## 2016-08-16 NOTE — Telephone Encounter (Signed)
Message left for the patient to call back to get information on her ultrasound and upper endoscopy.

## 2016-08-16 NOTE — Progress Notes (Signed)
Patient ID: Kari Lyons, female   DOB: 05-23-1951, 65 y.o.   MRN: [id number]  Chief Complaint  Patient presents with  . Routine Post Op    IBS    HPI Kari Lyons is a 65 y.o. female  here today for follow up right breast biopsy done 06/08/16. Patient states she had a flare up with IBS, complaining of RUQ pain and lower abdominal pain.  She is complaining of pain in right chest area which she attributed to acid reflux.  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 I have reviewed the history of present illness with the patient.  HPI  Past Medical History:  Diagnosis Date  . Anemia    H/O   . Arrhythmia 2008  . Arthritis   . 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 2009: Reported workup at [place] including ECHO, Stress Test and Cardiac Cath; records N/A.  ECHO with mild LVH, mild MVP; stress test and cath reportedly normal   . Depression   . Edema   . GERD (gastroesophageal reflux disease)   . Hypertension   . IBS (irritable bowel syndrome)   . Mitral valve prolapse   . Patient is Jehovah's Witness   . Sleep apnea    MILD-NO CPAP     Past Surgical History:  Procedure Laterality Date  . ABDOMINAL HYSTERECTOMY  1985  . BILATERAL SALPINGOOPHORECTOMY  2002   due to fh of ovarian ca  . BREAST LUMPECTOMY Right 06/08/2016   Procedure: BREAST LUMPECTOMY WITH ULTRASOUND IN O.R;  Surgeon: Christene Lye, MD;  Location: [facility] ORS;  Service: General;  Laterality: Right;  . BREAST SURGERY Left 02-26-13   subareloar duct excision and mass  . COLONOSCOPY  2012  . EXCISION / BIOPSY BREAST / NIPPLE / DUCT Right 1985   negative  . EXCISION / BIOPSY BREAST / NIPPLE / DUCT Left 02/26/2013   intraductal papilloma  . TONSILLECTOMY      Family History  Problem Relation Age of Onset  . Hypertension Mother   . Arthritis Mother   . Parkinson's disease Father   . Hypertension Sister   . Heart disease Daughter   . Cancer Sister 28   ovarian ca  . Cancer Brother     multiple myeloma  . Diabetes Brother   . Cancer Maternal Grandmother     colon ca  . Breast cancer Paternal Grandmother 55  . Bladder Cancer Paternal Uncle   . Prostate cancer Neg Hx   . Kidney cancer Neg Hx     Social History Social History  Substance Use Topics  . Smoking status: Never Smoker  . Smokeless tobacco: Never Used     Comment: smoked for 42month when 65yrold  . Alcohol use No    Allergies  Allergen Reactions  . Aloe Vera Rash    Current Outpatient Prescriptions  Medication Sig Dispense Refill  . Ascorbic Acid (VITAMIN C) 100 MG tablet Take 100 mg by mouth daily.    . Marland Kitchen complex vitamins capsule Take 1 capsule by mouth daily.    . Marland KitchenVER THE COUNTER MEDICATION Take 2 capsules by mouth at bedtime. Valarian Root    . triamterene-hydrochlorothiazide (MAXZIDE-25) 37.5-25 MG tablet Take 1 tablet by mouth daily. 90 tablet 3   No current facility-administered medications for this visit.     Review of Systems Review of Systems  Constitutional: Negative.   Respiratory: Negative.   Cardiovascular: Negative.     Blood  pressure 120/68, pulse 74, resp. rate 12, height 5' 5" (1.651 m), weight 180 lb (81.6 kg).  Physical Exam Physical Exam  Constitutional: She is oriented to person, place, and time. She appears well-developed and well-nourished.  Eyes: Conjunctivae are normal. No scleral icterus.  Neck: Neck supple.  Cardiovascular: Normal rate, regular rhythm and normal heart sounds.   Pulmonary/Chest: Effort normal and breath sounds normal. Right breast exhibits no inverted nipple, no mass, no nipple discharge, no skin change and no tenderness. Left breast exhibits no inverted nipple, no mass, no nipple discharge, no skin change and no tenderness.  Abdominal: Soft. Normal appearance and bowel sounds are normal. There is no hepatomegaly. There is no tenderness. No hernia.  Lymphadenopathy:    She has no cervical adenopathy.    She  has no axillary adenopathy.  Neurological: She is alert and oriented to person, place, and time.  Skin: Skin is warm.    Data Reviewed Prior notes  Assessment    Intraductal papilloma right breast- post excision. Abdominal symptoms as above- refkux , pain below rt breast and mid back    Plan    Patient to have abdominal ultrasound to evaluate for gallstones or other abnormalities. Also plan for endoscopy. Return in 6 mo for bilateral diagnostic mammogram.     The patient is scheduled for a Gallbladder ultrasound at [facility] on 08/26/16 at 8:30 am. She will arrive at 8:15 am and have nothing to eat or drink after midnight.  The patient is scheduled for an Upper Endoscopy on 09/21/16 at [facility]. She will be contacted by them with her arrival time. She is aware to have nothing to eat or drink after midnight the day before. She is aware of dates, time, and instructions.     SANKAR,SEEPLAPUTHUR G 08/17/2016, 11:12 AM

## 2016-08-16 NOTE — Patient Instructions (Addendum)
Patient to have abdominal ultrasound to evaluate for gallstones or other abnormalities. If normal ultrasound, plan for endoscopy. Return in 6 mo for bilateral diagnostic mammogram.   The patient is scheduled for a Gallbladder ultrasound at [facility] on 08/26/16 at 8:30 am. She will arrive at 8:15 am and have nothing to eat or drink after midnight.  The patient is scheduled for an Upper Endoscopy on 09/21/16 at [facility]. She will be contacted by them with her arrival time. She is aware to have nothing to eat or drink after midnight the day before. She is aware of dates, time, and instructions.

## 2016-08-17 NOTE — Telephone Encounter (Signed)
Reviewed information on her gallbladder ultrasound.

## 2016-08-26 ENCOUNTER — Ambulatory Visit
Admission: RE | Admit: 2016-08-26 | Discharge: 2016-08-26 | Disposition: A | Discharge status: Home / Self Care | Payer: Medicare Other | Source: Ambulatory Visit | Attending: General Surgery | Admitting: General Surgery

## 2016-08-26 DIAGNOSIS — R1011 Right upper quadrant pain: Secondary | ICD-10-CM | POA: Insufficient documentation

## 2016-08-30 ENCOUNTER — Telehealth: Payer: Self-pay

## 2016-08-30 NOTE — Telephone Encounter (Signed)
Notified patient as instructed, patient pleased. Discussed follow-up appointments, patient agrees  

## 2016-08-30 NOTE — Telephone Encounter (Signed)
-----   Message from Christene Lye, MD sent at 08/26/2016  1:05 PM EST ----- Regarding: xray report Inform pt [place] of gallbladder was normal. Will proceed with upper endoscopy as scheduled

## 2016-09-20 ENCOUNTER — Telehealth: Payer: Self-pay | Admitting: *Deleted

## 2016-09-20 NOTE — Telephone Encounter (Signed)
Instructions for upper endoscopy reviewed with patient. No further questions.

## 2016-09-20 NOTE — Telephone Encounter (Signed)
Patient wants you to call her back, she has some questions about her procedure tomorrow.

## 2016-09-21 ENCOUNTER — Other Ambulatory Visit: Payer: Self-pay | Admitting: *Deleted

## 2016-09-21 DIAGNOSIS — K2980 Duodenitis without bleeding: Secondary | ICD-10-CM

## 2016-09-21 DIAGNOSIS — K257 Chronic gastric ulcer without hemorrhage or perforation: Secondary | ICD-10-CM

## 2016-09-21 DIAGNOSIS — K2950 Unspecified chronic gastritis without bleeding: Secondary | ICD-10-CM

## 2016-09-21 MED ORDER — PANTOPRAZOLE SODIUM 40 MG PO TBEC: 40.0000 mg | DELAYED_RELEASE_TABLET | Freq: Every day | ORAL | 1 refills | Status: DC

## 2016-09-21 NOTE — Progress Notes (Signed)
Patient ID: Kari Lyons, female   DOB: 09-Dec-1950, 65 y.o.   MRN: [id number]   Please send in RX for Protonix 40 mg 1 po daily #30 with 1 refill per Dr Jamal Collin.

## 2016-09-22 ENCOUNTER — Encounter: Payer: Self-pay | Admitting: General Surgery

## 2016-10-18 ENCOUNTER — Telehealth: Payer: Self-pay | Admitting: *Deleted

## 2016-10-18 NOTE — Telephone Encounter (Signed)
Patient called wanting to know biopsy results from upper endoscopy done on 09/21/16. Thanks

## 2016-10-19 NOTE — Telephone Encounter (Signed)
Patient notified as instructed that pathology showed mild inflammation. Will review in detail at appointment next week. Patient verbalized understanding.

## 2016-10-25 ENCOUNTER — Ambulatory Visit (INDEPENDENT_AMBULATORY_CARE_PROVIDER_SITE_OTHER): Payer: Medicare Other | Admitting: General Surgery

## 2016-10-25 ENCOUNTER — Encounter: Payer: Self-pay | Admitting: General Surgery

## 2016-10-25 VITALS — BP 136/72 | HR 78 | Resp 14 | Ht 62.0 in | Wt 189.0 lb

## 2016-10-25 DIAGNOSIS — K2990 Gastroduodenitis, unspecified, without bleeding: Secondary | ICD-10-CM | POA: Diagnosis not present

## 2016-10-25 DIAGNOSIS — K2970 Gastritis, unspecified, without bleeding: Secondary | ICD-10-CM

## 2016-10-25 DIAGNOSIS — R1011 Right upper quadrant pain: Secondary | ICD-10-CM

## 2016-10-25 NOTE — Progress Notes (Signed)
Patient ID: Kari Lyons, female   DOB: 09-28-1951, 66 y.o.   MRN: [id number]  Chief Complaint  Patient presents with  . Follow-up    endoscopy    HPI Kari Lyons is a 66 y.o. female here today for her one month follow up endoscopy done on 09/21/2016. Patient states she is doing well. Her upper abdominal pain is resolved at present. I have reviewed the history of present illness with the patient.  HPI  Past Medical History:  Diagnosis Date  . Anemia    H/O   . Arrhythmia 2008  . Arthritis   . 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 2009: Reported workup at [place] including ECHO, Stress Test and Cardiac Cath; records N/A.  ECHO with mild LVH, mild MVP; stress test and cath reportedly normal   . Depression   . Edema   . GERD (gastroesophageal reflux disease)   . Hypertension   . IBS (irritable bowel syndrome)   . Mitral valve prolapse   . Patient is Jehovah's Witness   . Sleep apnea    MILD-NO CPAP     Past Surgical History:  Procedure Laterality Date  . ABDOMINAL HYSTERECTOMY  1985  . BILATERAL SALPINGOOPHORECTOMY  2002   due to fh of ovarian ca  . BREAST LUMPECTOMY Right 06/08/2016   Procedure: BREAST LUMPECTOMY WITH ULTRASOUND IN O.R;  Surgeon: Christene Lye, MD;  Location: [facility] ORS;  Service: General;  Laterality: Right;  . BREAST SURGERY Left 02-26-13   subareloar duct excision and mass  . COLONOSCOPY  2012  . EXCISION / BIOPSY BREAST / NIPPLE / DUCT Right 1985   negative  . EXCISION / BIOPSY BREAST / NIPPLE / DUCT Left 02/26/2013   intraductal papilloma  . TONSILLECTOMY      Family History  Problem Relation Age of Onset  . Hypertension Mother   . Arthritis Mother   . Parkinson's disease Father   . Hypertension Sister   . Heart disease Daughter   . Cancer Sister 83    ovarian ca  . Cancer Brother     multiple myeloma  . Diabetes Brother   . Cancer Maternal Grandmother     colon ca  . Breast cancer Paternal Grandmother 31  .  Bladder Cancer Paternal Uncle   . Prostate cancer Neg Hx   . Kidney cancer Neg Hx     Social History Social History  Substance Use Topics  . Smoking status: Never Smoker  . Smokeless tobacco: Never Used     Comment: smoked for 47month when 66yrold  . Alcohol use No    Allergies  Allergen Reactions  . Aloe Vera Rash    Current Outpatient Prescriptions  Medication Sig Dispense Refill  . Ascorbic Acid (VITAMIN C) 100 MG tablet Take 100 mg by mouth daily.    . Marland Kitchen complex vitamins capsule Take 1 capsule by mouth daily.    . Marland KitchenVER THE COUNTER MEDICATION Take 2 capsules by mouth at bedtime. Valarian Root    . pantoprazole (PROTONIX) 40 MG tablet Take 1 tablet (40 mg total) by mouth daily. 30 tablet 1  . triamterene-hydrochlorothiazide (MAXZIDE-25) 37.5-25 MG tablet Take 1 tablet by mouth daily. 90 tablet 3   No current facility-administered medications for this visit.     Review of Systems Review of Systems  Constitutional: Negative.   Respiratory: Negative.   Cardiovascular: Negative.     Blood pressure 136/72, pulse 78, resp. rate 14, height 5' 2" (  1.575 m), weight 189 lb (85.7 kg).  Physical Exam Physical Exam  Data Reviewed Prior notes reviewed  Biopsy from stomach and duodenum showed inflammation no H pylorii Assessment    Pt advised fully on endoscopic findings. It appears her upper abdominal pain has resolved after use of Protonix.  Advised to complete her second month of Protonix and then she can go OTC meds- prilosec or pepcid    Plan   Return as scheduled in 3 mos with her scheduled mammogram.  This information has been scribed by Marsha Hatch RN, BSN,BC.        SANKAR,SEEPLAPUTHUR G 10/25/2016, 2:25 PM   

## 2016-10-25 NOTE — Patient Instructions (Signed)
Return as scheduled 

## 2016-10-31 ENCOUNTER — Encounter: Payer: Self-pay | Admitting: General Surgery

## 2016-11-10 ENCOUNTER — Ambulatory Visit (INDEPENDENT_AMBULATORY_CARE_PROVIDER_SITE_OTHER): Payer: Medicare Other | Admitting: Family

## 2016-11-10 ENCOUNTER — Encounter: Payer: Self-pay | Admitting: Family

## 2016-11-10 VITALS — BP 122/68 | HR 80 | Temp 98.4°F | Ht 62.0 in | Wt 187.4 lb

## 2016-11-10 DIAGNOSIS — J209 Acute bronchitis, unspecified: Secondary | ICD-10-CM

## 2016-11-10 MED ORDER — BENZONATATE 100 MG PO CAPS: 100.0000 mg | ORAL_CAPSULE | Freq: Two times a day (BID) | ORAL | 0 refills | Status: DC | PRN

## 2016-11-10 NOTE — Progress Notes (Signed)
Subjective:    Patient ID: Kari Lyons, female    DOB: 1950/12/22, 66 y.o.   MRN: [id number]  CC: Kari Lyons is a 66 y.o. female who presents today for an acute visit.    HPI: CC: dry cough, congestion x 2 weeks, unchanged. Endorses clear runny nasal congestion.  No wheezing, sob, fever, chills, sinus pressure. Hasn't tried any medications No smoking.      HISTORY:  Past Medical History:  Diagnosis Date  . Anemia    H/O   . Arrhythmia 2008  . Arthritis   . 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 2009: Reported workup at [place] including ECHO, Stress Test and Cardiac Cath; records N/A.  ECHO with mild LVH, mild MVP; stress test and cath reportedly normal   . Depression   . Edema   . GERD (gastroesophageal reflux disease)   . Hypertension   . IBS (irritable bowel syndrome)   . Mitral valve prolapse   . Patient is Jehovah's Witness   . Sleep apnea    MILD-NO CPAP    Past Surgical History:  Procedure Laterality Date  . ABDOMINAL HYSTERECTOMY  1985  . BILATERAL SALPINGOOPHORECTOMY  2002   due to fh of ovarian ca  . BREAST LUMPECTOMY Right 06/08/2016   Procedure: BREAST LUMPECTOMY WITH ULTRASOUND IN O.R;  Surgeon: Christene Lye, MD;  Location: [facility] ORS;  Service: General;  Laterality: Right;  . BREAST SURGERY Left 02-26-13   subareloar duct excision and mass  . COLONOSCOPY  2012  . EXCISION / BIOPSY BREAST / NIPPLE / DUCT Right 1985   negative  . EXCISION / BIOPSY BREAST / NIPPLE / DUCT Left 02/26/2013   intraductal papilloma  . TONSILLECTOMY     Family History  Problem Relation Age of Onset  . Hypertension Mother   . Arthritis Mother   . Parkinson's disease Father   . Hypertension Sister   . Heart disease Daughter   . Cancer Sister 9    ovarian ca  . Cancer Brother     multiple myeloma  . Diabetes Brother   . Cancer Maternal Grandmother     colon ca  . Breast cancer Paternal Grandmother 64  . Bladder Cancer Paternal Uncle   .  Prostate cancer Neg Hx   . Kidney cancer Neg Hx     Allergies: Aloe vera Current Outpatient Prescriptions on File Prior to Visit  Medication Sig Dispense Refill  . Ascorbic Acid (VITAMIN C) 100 MG tablet Take 100 mg by mouth daily.    Marland Kitchen b complex vitamins capsule Take 1 capsule by mouth daily.    Marland Kitchen OVER THE COUNTER MEDICATION Take 2 capsules by mouth at bedtime. Valarian Root    . pantoprazole (PROTONIX) 40 MG tablet Take 1 tablet (40 mg total) by mouth daily. 30 tablet 1  . triamterene-hydrochlorothiazide (MAXZIDE-25) 37.5-25 MG tablet Take 1 tablet by mouth daily. 90 tablet 3   No current facility-administered medications on file prior to visit.     Social History  Substance Use Topics  . Smoking status: Never Smoker  . Smokeless tobacco: Never Used     Comment: smoked for 43month when 66yrold  . Alcohol use No    Review of Systems  Constitutional: Negative for chills and fever.  HENT: Positive for congestion.   Respiratory: Positive for cough.   Cardiovascular: Negative for chest pain and palpitations.  Gastrointestinal: Negative for nausea and vomiting.      Objective:  BP 122/68   Pulse 80   Temp 98.4 F (36.9 C) (Oral)   Ht 5' 2" (1.575 m)   Wt 187 lb 6.4 oz (85 kg)   SpO2 98%   BMI 34.28 kg/m    Physical Exam  Constitutional: She appears well-developed and well-nourished.  HENT:  Head: Normocephalic and atraumatic.  Right Ear: Hearing, tympanic membrane, external ear and ear canal normal. No drainage, swelling or tenderness. No foreign bodies. Tympanic membrane is not erythematous and not bulging. No middle ear effusion. No decreased hearing is noted.  Left Ear: Hearing, tympanic membrane, external ear and ear canal normal. No drainage, swelling or tenderness. No foreign bodies. Tympanic membrane is not erythematous and not bulging.  No middle ear effusion. No decreased hearing is noted.  Nose: Rhinorrhea present. Right sinus exhibits no maxillary sinus  tenderness and no frontal sinus tenderness. Left sinus exhibits no maxillary sinus tenderness and no frontal sinus tenderness.  Mouth/Throat: Uvula is midline, oropharynx is clear and moist and mucous membranes are normal. No oropharyngeal exudate, posterior oropharyngeal edema, posterior oropharyngeal erythema or tonsillar abscesses.  Eyes: Conjunctivae are normal.  Cardiovascular: Regular rhythm, normal heart sounds and normal pulses.   Pulmonary/Chest: Effort normal and breath sounds normal. She has no wheezes. She has no rhonchi. She has no rales.  Lymphadenopathy:       Head (right side): No submental, no submandibular, no tonsillar, no preauricular, no posterior auricular and no occipital adenopathy present.       Head (left side): No submental, no submandibular, no tonsillar, no preauricular, no posterior auricular and no occipital adenopathy present.    She has no cervical adenopathy.  Neurological: She is alert.  Skin: Skin is warm and dry.  Psychiatric: She has a normal mood and affect. Her speech is normal and behavior is normal. Thought content normal.  Vitals reviewed.      Assessment & Plan:  1. Acute bronchitis, unspecified organism Afebrile and no adventitious lung sounds. Patient and I discussed likely etiology is viral bronchitis. We jointly agreed on delayed antibiotic and treatment with conservative therapy. Tessalon Perles as needed for cough. Return precautions given.  - benzonatate (TESSALON) 100 MG capsule; Take 1 capsule (100 mg total) by mouth 2 (two) times daily as needed for cough.  Dispense: 20 capsule; Refill: 0     I am having Ms. Fuhrmann maintain her OVER THE COUNTER MEDICATION, vitamin C, triamterene-hydrochlorothiazide, b complex vitamins, pantoprazole, cetirizine, and fluticasone.   Meds ordered this encounter  Medications  . cetirizine (ZYRTEC) 10 MG tablet    Sig: Take 10 mg by mouth daily.  . fluticasone (FLONASE) 50 MCG/ACT nasal spray    Sig:  Place into both nostrils daily.    Return precautions given.   Risks, benefits, and alternatives of the medications and treatment plan prescribed today were discussed, and patient expressed understanding.   Education regarding symptom management and diagnosis given to patient on AVS.  Continue to follow with TULLO, Aris Everts, MD for routine health maintenance.   Kari Lyons and I agreed with plan.   Mable Paris, FNP

## 2016-11-10 NOTE — Patient Instructions (Signed)
Tessalon as needed  Increase intake of clear fluids. Congestion is best treated by hydration, when mucus is wetter, it is thinner, less sticky, and easier to expel from the body, either through coughing up drainage, or by blowing your nose.   Get plenty of rest.   Use saline nasal drops and blow your nose frequently. Run a humidifier at night and elevate the head of the bed. Vicks Vapor rub will help with congestion and cough. Steam showers and sinus massage for congestion.   Use Acetaminophen or Ibuprofen as needed for fever or pain. Avoid second hand smoke. Even the smallest exposure will worsen symptoms.   Over the counter medications you can try include Delsym for cough, a decongestant for congestion, and Mucinex or Robitussin as an expectorant. Be sure to just get the plain Mucinex or Robitussin that just has one medication (Guaifenesen). We don't recommend the combination products. Note, be sure to drink two glasses of water with each dose of Mucinex as the medication will not work well without adequate hydration.   You can also try a teaspoon of honey to see if this will help reduce cough. Throat lozenges can sometimes be beneficial as well.    This illness will typically last 7 - 10 days.   Please follow up with our clinic if you develop a fever greater than 101 F, symptoms worsen, or do not resolve in the next week.

## 2016-12-16 ENCOUNTER — Other Ambulatory Visit: Payer: Self-pay

## 2016-12-16 DIAGNOSIS — D369 Benign neoplasm, unspecified site: Secondary | ICD-10-CM

## 2017-03-02 ENCOUNTER — Other Ambulatory Visit: Payer: Medicare Other

## 2017-03-03 ENCOUNTER — Ambulatory Visit
Admission: RE | Admit: 2017-03-03 | Discharge: 2017-03-03 | Disposition: A | Discharge status: Home / Self Care | Payer: Medicare Other | Source: Ambulatory Visit | Attending: General Surgery | Admitting: General Surgery

## 2017-03-03 DIAGNOSIS — D369 Benign neoplasm, unspecified site: Secondary | ICD-10-CM | POA: Insufficient documentation

## 2017-03-03 DIAGNOSIS — Z9889 Other specified postprocedural states: Secondary | ICD-10-CM | POA: Diagnosis not present

## 2017-03-09 ENCOUNTER — Ambulatory Visit: Payer: Medicare Other | Admitting: General Surgery

## 2017-03-14 ENCOUNTER — Ambulatory Visit (INDEPENDENT_AMBULATORY_CARE_PROVIDER_SITE_OTHER): Payer: Medicare Other | Admitting: General Surgery

## 2017-03-14 ENCOUNTER — Encounter: Payer: Self-pay | Admitting: General Surgery

## 2017-03-14 VITALS — BP 126/76 | HR 66 | Resp 12 | Ht 65.0 in | Wt 191.0 lb

## 2017-03-14 DIAGNOSIS — D369 Benign neoplasm, unspecified site: Secondary | ICD-10-CM

## 2017-03-14 DIAGNOSIS — D213 Benign neoplasm of connective and other soft tissue of thorax: Secondary | ICD-10-CM

## 2017-03-14 DIAGNOSIS — L819 Disorder of pigmentation, unspecified: Secondary | ICD-10-CM

## 2017-03-14 NOTE — Patient Instructions (Signed)
The patient is aware to call back for any questions or concerns.  

## 2017-03-14 NOTE — Progress Notes (Signed)
Patient ID: Kari Lyons, female   DOB: [birth year], 66 y.o.   MRN: [id number]  Chief Complaint  Patient presents with  . Follow-up    mammogram    HPI Kari Lyons is a 66 y.o. female.  who presents for a breast evaluation. The most recent mammogram was done on 03-03-17. Right breast is still tender. Intraductal papilloma right breast- post excision. Patient does perform regular self breast checks and gets regular mammograms done.   Endoscopy done on 09/21/2016. Denies any reflux issues. She is having arthritis issues in her upper back/neck. She is complaining of facial swelling.  HPI  Past Medical History:  Diagnosis Date  . Anemia    H/O   . Arrhythmia 2008  . Arthritis   . 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 2009: Reported workup at [place] including ECHO, Stress Test and Cardiac Cath; records N/A.  ECHO with mild LVH, mild MVP; stress test and cath reportedly normal   . Depression   . Edema   . GERD (gastroesophageal reflux disease)   . Hypertension   . IBS (irritable bowel syndrome)   . Mitral valve prolapse   . Patient is Jehovah's Witness   . Sleep apnea    MILD-NO CPAP     Past Surgical History:  Procedure Laterality Date  . ABDOMINAL HYSTERECTOMY  1985  . BILATERAL SALPINGOOPHORECTOMY  2002   due to fh of ovarian ca  . BREAST BIOPSY Right 03/09/2016   papilloma  . BREAST LUMPECTOMY Right 06/08/2016   Procedure: BREAST LUMPECTOMY WITH ULTRASOUND IN O.R;  Surgeon: Christene Lye, MD;  Location: [facility] ORS;  Service: General;  Laterality: Right;  . BREAST SURGERY Left 02-26-13   subareloar duct excision and mass  . COLONOSCOPY  2012  . EXCISION / BIOPSY BREAST / NIPPLE / DUCT Right 1985   negative  . EXCISION / BIOPSY BREAST / NIPPLE / DUCT Left 02/26/2013   intraductal papilloma  . EXCISION / BIOPSY BREAST / NIPPLE / DUCT Right 06/09/2016  . TONSILLECTOMY      Family History  Problem Relation Age of Onset  . Hypertension Mother    . Arthritis Mother   . Parkinson's disease Father   . Hypertension Sister   . Heart disease Daughter   . Cancer Sister 41       ovarian ca  . Cancer Brother        multiple myeloma  . Diabetes Brother   . Cancer Maternal Grandmother        colon ca  . Breast cancer Paternal Grandmother 77  . Bladder Cancer Paternal Uncle   . Prostate cancer Neg Hx   . Kidney cancer Neg Hx     Social History Social History  Substance Use Topics  . Smoking status: Never Smoker  . Smokeless tobacco: Never Used     Comment: smoked for 11month when 66yrold  . Alcohol use No    Allergies  Allergen Reactions  . Aloe Vera Rash    Current Outpatient Prescriptions  Medication Sig Dispense Refill  . Ascorbic Acid (VITAMIN C) 100 MG tablet Take 100 mg by mouth daily.    . cetirizine (ZYRTEC) 10 MG tablet Take 10 mg by mouth daily.    . fluticasone (FLONASE) 50 MCG/ACT nasal spray Place into both nostrils daily.    . Marland Kitchenriamterene-hydrochlorothiazide (MAXZIDE-25) 37.5-25 MG tablet Take 1 tablet by mouth daily. 90 tablet 3   No current facility-administered medications for this visit.  Review of Systems Review of Systems  Constitutional: Negative.   Respiratory: Negative.   Cardiovascular: Negative.     Blood pressure 126/76, pulse 66, resp. rate 12, height _0  (1.651 m), weight 191 lb (86.6 kg).  Physical Exam Physical Exam  Constitutional: She is oriented to person, place, and time. She appears well-developed and well-nourished.  HENT:  Mouth/Throat: Oropharynx is clear and moist.  Eyes: Conjunctivae are normal. No scleral icterus.  Neck: Neck supple.  Cardiovascular: Normal rate, regular rhythm and normal heart sounds.   Pulmonary/Chest: Effort normal and breath sounds normal. Right breast exhibits no inverted nipple, no mass, no nipple discharge, no skin change and no tenderness. Left breast exhibits skin change. Left breast exhibits no inverted nipple, no mass, no nipple  discharge and no tenderness.  Left breast 3 mm dark pigmented lesion with rim of erythema at 11 o'clock outside areolar   Abdominal: Soft. Normal appearance. There is no tenderness.  Lymphadenopathy:    She has no cervical adenopathy.    She has no axillary adenopathy.  Neurological: She is alert and oriented to person, place, and time.  Skin: Skin is warm and dry.  Psychiatric: Her behavior is normal.    Data Reviewed Mammogram reviewed. Mild gastriris on endoscopy- no H pylori Assessment    Intraductal papilloma right breast- post excision. 3 mm dark pigmented lesion with rim of erythema at 11 o'clock outside areolar left breast She has stopped the Protonix and the RUQ pain has resolved.      Plan    Patient will be asked to return to the office in one year with a bilateral screening mammogram with Dr Bary Castilla  Punch biopsy of skin lesion on left breast completed with her consent. After alcohol prep, 47m 1%xylocaine with epi instilled. Punch biopsy obtained and sent for pathology.Minimal bleeding controlled with pressure. Neosporin oint and spot band aid placed     HPI, Physical Exam, Assessment and Plan have been scribed under the direction and in the presence of SMckinley Jewel MD  MKarie Fetch RN I have completed the exam and reviewed the above documentation for accuracy and completeness.  I agree with the above.  DHaematologisthas been used and any errors in dictation or transcription are unintentional.  Seeplaputhur G. SJamal Collin M.D., F.A.C.S.   SJunie PanningG 03/14/2017, 1:11 PM

## 2017-03-20 ENCOUNTER — Telehealth: Payer: Self-pay

## 2017-03-20 NOTE — Telephone Encounter (Signed)
Message left for the patient to call back to get results.

## 2017-03-20 NOTE — Telephone Encounter (Signed)
-----   Message from Christene Lye, MD sent at 03/20/2017 10:13 AM EDT ----- Please notify pt, the skin biopsy was benign

## 2017-03-21 NOTE — Telephone Encounter (Signed)
Notified patient as instructed, patient pleased. Discussed follow-up appointments, patient agrees  

## 2017-03-28 ENCOUNTER — Telehealth: Payer: Self-pay | Admitting: Internal Medicine

## 2017-03-28 DIAGNOSIS — I10 Essential (primary) hypertension: Secondary | ICD-10-CM

## 2017-03-28 MED ORDER — TRIAMTERENE-HCTZ 37.5-25 MG PO TABS: 1.0000 | ORAL_TABLET | Freq: Every day | ORAL | 0 refills | Status: DC

## 2017-03-28 NOTE — Telephone Encounter (Signed)
Pt is requesting to have her triamterene-hydrochlorothiazide (MAXZIDE-25) 37.5-25 MG tablet refilled. Please advise, [phone]

## 2017-03-28 NOTE — Telephone Encounter (Signed)
Medication has been refilled and pt has been notified.  

## 2017-05-19 ENCOUNTER — Encounter: Payer: Self-pay | Admitting: Internal Medicine

## 2017-05-19 ENCOUNTER — Ambulatory Visit (INDEPENDENT_AMBULATORY_CARE_PROVIDER_SITE_OTHER): Payer: Medicare Other | Admitting: Internal Medicine

## 2017-05-19 ENCOUNTER — Ambulatory Visit (INDEPENDENT_AMBULATORY_CARE_PROVIDER_SITE_OTHER): Payer: Medicare Other

## 2017-05-19 VITALS — BP 120/64 | HR 79 | Temp 98.2°F | Resp 15 | Ht 65.0 in | Wt 196.8 lb

## 2017-05-19 DIAGNOSIS — Z8719 Personal history of other diseases of the digestive system: Secondary | ICD-10-CM | POA: Diagnosis not present

## 2017-05-19 DIAGNOSIS — E7800 Pure hypercholesterolemia, unspecified: Secondary | ICD-10-CM | POA: Diagnosis not present

## 2017-05-19 DIAGNOSIS — K580 Irritable bowel syndrome with diarrhea: Secondary | ICD-10-CM | POA: Diagnosis not present

## 2017-05-19 DIAGNOSIS — E538 Deficiency of other specified B group vitamins: Secondary | ICD-10-CM

## 2017-05-19 DIAGNOSIS — M5412 Radiculopathy, cervical region: Secondary | ICD-10-CM

## 2017-05-19 DIAGNOSIS — E559 Vitamin D deficiency, unspecified: Secondary | ICD-10-CM | POA: Diagnosis not present

## 2017-05-19 DIAGNOSIS — Z79899 Other long term (current) drug therapy: Secondary | ICD-10-CM | POA: Diagnosis not present

## 2017-05-19 DIAGNOSIS — M5030 Other cervical disc degeneration, unspecified cervical region: Secondary | ICD-10-CM

## 2017-05-19 DIAGNOSIS — Z8711 Personal history of peptic ulcer disease: Secondary | ICD-10-CM

## 2017-05-19 DIAGNOSIS — I10 Essential (primary) hypertension: Secondary | ICD-10-CM

## 2017-05-19 DIAGNOSIS — R7301 Impaired fasting glucose: Secondary | ICD-10-CM

## 2017-05-19 DIAGNOSIS — Z0001 Encounter for general adult medical examination with abnormal findings: Secondary | ICD-10-CM

## 2017-05-19 DIAGNOSIS — Z0000 Encounter for general adult medical examination without abnormal findings: Secondary | ICD-10-CM

## 2017-05-19 LAB — COMPREHENSIVE METABOLIC PANEL
ALT: 29 U/L (ref 0–35)
AST: 22 U/L (ref 0–37)
Albumin: 4.4 g/dL (ref 3.5–5.2)
Alkaline Phosphatase: 66 U/L (ref 39–117)
BUN: 11 mg/dL (ref 6–23)
CO2: 35 mEq/L — ABNORMAL HIGH (ref 19–32)
Calcium: 9.8 mg/dL (ref 8.4–10.5)
Chloride: 101 mEq/L (ref 96–112)
Creatinine, Ser: 0.75 mg/dL (ref 0.40–1.20)
GFR: 99.12 mL/min (ref >60.00)
Glucose, Bld: 95 mg/dL (ref 70–99)
Potassium: 3.8 mEq/L (ref 3.5–5.1)
Sodium: 141 mEq/L (ref 135–145)
Total Bilirubin: 0.5 mg/dL (ref 0.2–1.2)
Total Protein: 7 g/dL (ref 6.0–8.3)

## 2017-05-19 LAB — LIPID PANEL
Cholesterol: 172 mg/dL (ref 0–200)
HDL: 43.8 mg/dL (ref >39.00)
LDL Cholesterol: 97 mg/dL (ref 0–99)
NonHDL: 127.77
Total CHOL/HDL Ratio: 4
Triglycerides: 156 mg/dL — ABNORMAL HIGH (ref 0.0–149.0)
VLDL: 31.2 mg/dL (ref 0.0–40.0)

## 2017-05-19 LAB — CBC WITH DIFFERENTIAL/PLATELET
Basophils Absolute: 0 10*3/uL (ref 0.0–0.1)
Basophils Relative: 0.3 % (ref 0.0–3.0)
Eosinophils Absolute: 0.1 10*3/uL (ref 0.0–0.7)
Eosinophils Relative: 2.1 % (ref 0.0–5.0)
HCT: 39.1 % (ref 36.0–46.0)
Hemoglobin: 12.5 g/dL (ref 12.0–15.0)
Lymphocytes Relative: 42.3 % (ref 12.0–46.0)
Lymphs Abs: 2.2 10*3/uL (ref 0.7–4.0)
MCHC: 32 g/dL (ref 30.0–36.0)
MCV: 88.6 fl (ref 78.0–100.0)
Monocytes Absolute: 0.4 10*3/uL (ref 0.1–1.0)
Monocytes Relative: 8.4 % (ref 3.0–12.0)
Neutro Abs: 2.5 10*3/uL (ref 1.4–7.7)
Neutrophils Relative %: 46.9 % (ref 43.0–77.0)
Platelets: 223 10*3/uL (ref 150.0–400.0)
RBC: 4.42 Mil/uL (ref 3.87–5.11)
RDW: 13.6 % (ref 11.5–15.5)
WBC: 5.2 10*3/uL (ref 4.0–10.5)

## 2017-05-19 LAB — HEMOGLOBIN A1C: Hgb A1c MFr Bld: 6.5 % (ref 4.6–6.5)

## 2017-05-19 LAB — VITAMIN B12: Vitamin B-12: 282 pg/mL (ref 211–911)

## 2017-05-19 LAB — VITAMIN D 25 HYDROXY (VIT D DEFICIENCY, FRACTURES): VITD: 60.56 ng/mL (ref 30.00–100.00)

## 2017-05-19 LAB — TSH: TSH: 1.82 u[IU]/mL (ref 0.35–4.50)

## 2017-05-19 MED ORDER — DICYCLOMINE HCL 10 MG PO CAPS: 10.0000 mg | ORAL_CAPSULE | Freq: Three times a day (TID) | ORAL | 0 refills | Status: DC

## 2017-05-19 MED ORDER — FAMOTIDINE 20 MG PO TABS: 20.0000 mg | ORAL_TABLET | Freq: Two times a day (BID) | ORAL | 1 refills | Status: DC

## 2017-05-19 NOTE — Assessment & Plan Note (Addendum)
By Dec 2017 EGD . Symptoms have resolved.  Discussed current controversy regarding prolonged use of PPI in patients without documented Barretts esophagus.  Patient has no prior EGD but has been on PPI therapy for > 5 years (per patient).  Suggested trial of pepcid 20 mg daily.  If GERD symptoms return,  advised her to accept referral for EGD.
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
## 2017-05-19 NOTE — Assessment & Plan Note (Addendum)
Managed with fennel tea,  Prior use of Librax , years ago.  willing to try bentyl

## 2017-05-19 NOTE — Patient Instructions (Addendum)
  I appreciate your concern about continuing your PPI in light of the recently published studies suggesting an association with increased risk of dementia and kidney failure.  I advise you to try switching  From your PPI to either famotidine 20 mg once or twice daily,  or to  ranitidine 150 mg once or twice daily.  These medications are  H2 blockers and are available without a prescriptions.   if your reflux symptoms are controlled,  You can Continue the daily h2 blocker.   For your irritable bowel, I recommend that you try using dicyclomine 20-30 minutess before meals,  Up to 3 times daily . It is an anti spasmodic for the stomach and intestines    Your Next DEXA scan should be done in 2024 to see if bones have become less dense .

## 2017-05-21 DIAGNOSIS — M50123 Cervical disc disorder at C6-C7 level with radiculopathy: Secondary | ICD-10-CM | POA: Insufficient documentation

## 2017-05-21 NOTE — Assessment & Plan Note (Signed)
Annual comprehensive preventive exam was done as well as an evaluation and management of chronic conditions .  During the course of the visit the patient was educated and counseled about appropriate screening and preventive services including :  diabetes screening, lipid analysis with projected  10 year  risk for CAD , nutrition counseling, breast,  colorectal cancer screening, and recommended immunizations.  Printed recommendations for health maintenance screenings was given 

## 2017-05-21 NOTE — Assessment & Plan Note (Signed)
She has degenerative changes from C6 to C8 and a linear lucency through C5 vertebrae that merits further evaluation with CT scan to rule out fracture

## 2017-05-22 ENCOUNTER — Telehealth: Payer: Self-pay

## 2017-05-22 DIAGNOSIS — M5030 Other cervical disc degeneration, unspecified cervical region: Secondary | ICD-10-CM

## 2017-05-22 DIAGNOSIS — M5412 Radiculopathy, cervical region: Secondary | ICD-10-CM

## 2017-05-22 NOTE — Telephone Encounter (Signed)
Reordered correct ct scan

## 2017-05-26 ENCOUNTER — Telehealth: Payer: Self-pay | Admitting: Internal Medicine

## 2017-05-26 NOTE — Telephone Encounter (Signed)
Pt called and is wondering if Dr. Derrel Nip would write her a script for a glucometer and strips that way she can keep track of her blood sugar. Please advise, thank you!  Call pt @ [place], [place]

## 2017-05-29 ENCOUNTER — Ambulatory Visit (HOSPITAL_COMMUNITY)
Admission: RE | Admit: 2017-05-29 | Discharge: 2017-05-29 | Disposition: A | Discharge status: Home / Self Care | Payer: Medicare Other | Source: Ambulatory Visit | Attending: Internal Medicine | Admitting: Internal Medicine

## 2017-05-29 DIAGNOSIS — M5412 Radiculopathy, cervical region: Secondary | ICD-10-CM

## 2017-05-29 DIAGNOSIS — M5030 Other cervical disc degeneration, unspecified cervical region: Secondary | ICD-10-CM | POA: Diagnosis present

## 2017-05-30 ENCOUNTER — Encounter: Payer: Self-pay | Admitting: Internal Medicine

## 2017-05-30 ENCOUNTER — Other Ambulatory Visit: Payer: Self-pay | Admitting: Internal Medicine

## 2017-05-30 ENCOUNTER — Telehealth: Payer: Self-pay | Admitting: Internal Medicine

## 2017-05-30 DIAGNOSIS — E119 Type 2 diabetes mellitus without complications: Secondary | ICD-10-CM | POA: Insufficient documentation

## 2017-05-30 MED ORDER — BLOOD GLUCOSE MONITOR KIT: PACK | 0 refills | Status: DC

## 2017-05-30 NOTE — Telephone Encounter (Signed)
Left pt message asking to call Ebony Hail back directly at [phone] to schedule AWV. Thanks!  *NOTE* Last AWV 03/11/16

## 2017-05-30 NOTE — Telephone Encounter (Signed)
Pt does not have a diabetes diagnoses. Is it ok to send in?

## 2017-05-30 NOTE — Telephone Encounter (Signed)
Diagnosed with last set of labs.  rx sent to [facility]

## 2017-05-30 NOTE — Telephone Encounter (Signed)
LMTCB. Need to let pt know that Dr. Derrel Nip sent in the rx for the glucometer and supplies.

## 2017-06-02 ENCOUNTER — Telehealth: Payer: Self-pay | Admitting: Internal Medicine

## 2017-06-02 NOTE — Telephone Encounter (Signed)
Pt called back returning your call. Please advise, thank you!  Call pt @ [phone]

## 2017-06-02 NOTE — Telephone Encounter (Signed)
See result note.  

## 2017-06-04 ENCOUNTER — Other Ambulatory Visit: Payer: Self-pay | Admitting: Internal Medicine

## 2017-06-04 DIAGNOSIS — M5030 Other cervical disc degeneration, unspecified cervical region: Secondary | ICD-10-CM

## 2017-06-04 DIAGNOSIS — M5412 Radiculopathy, cervical region: Secondary | ICD-10-CM

## 2017-06-12 ENCOUNTER — Ambulatory Visit (INDEPENDENT_AMBULATORY_CARE_PROVIDER_SITE_OTHER): Payer: Medicare Other | Admitting: Internal Medicine

## 2017-06-12 ENCOUNTER — Ambulatory Visit: Payer: Medicare Other

## 2017-06-12 ENCOUNTER — Encounter: Payer: Self-pay | Admitting: Internal Medicine

## 2017-06-12 VITALS — BP 146/78 | HR 73 | Temp 98.3°F | Resp 14 | Ht 65.0 in | Wt 190.4 lb

## 2017-06-12 VITALS — BP 146/78 | HR 73 | Temp 98.3°F | Resp 16 | Ht 65.0 in | Wt 190.4 lb

## 2017-06-12 DIAGNOSIS — Z6832 Body mass index (BMI) 32.0-32.9, adult: Secondary | ICD-10-CM | POA: Diagnosis not present

## 2017-06-12 DIAGNOSIS — E538 Deficiency of other specified B group vitamins: Secondary | ICD-10-CM | POA: Diagnosis not present

## 2017-06-12 DIAGNOSIS — K580 Irritable bowel syndrome with diarrhea: Secondary | ICD-10-CM | POA: Diagnosis not present

## 2017-06-12 DIAGNOSIS — Z0000 Encounter for general adult medical examination without abnormal findings: Secondary | ICD-10-CM | POA: Diagnosis not present

## 2017-06-12 DIAGNOSIS — I10 Essential (primary) hypertension: Secondary | ICD-10-CM

## 2017-06-12 DIAGNOSIS — E119 Type 2 diabetes mellitus without complications: Secondary | ICD-10-CM

## 2017-06-12 DIAGNOSIS — E6609 Other obesity due to excess calories: Secondary | ICD-10-CM | POA: Diagnosis not present

## 2017-06-12 MED ORDER — LOSARTAN POTASSIUM 25 MG PO TABS: 25.0000 mg | ORAL_TABLET | Freq: Every day | ORAL | 1 refills | Status: DC

## 2017-06-12 NOTE — Patient Instructions (Addendum)
  Ms. Bagot , Thank you for taking time to come for your Medicare Wellness Visit. I appreciate your ongoing commitment to your health goals. Please review the following plan we discussed and let me know if I can assist you in the future.   Follow up with Dr. Derrel Nip as needed.    Have a great day!  These are the goals we discussed: Goals    . Weight (lb) < 170 lb (77.1 kg)          Lose 20lbs Core exercises as demonstrated.  Additional educational material provided.        This is a list of the screening recommended for you and due dates:  Health Maintenance  Topic Date Due  . Complete foot exam   05/11/1960  . Tetanus Vaccine  05/11/1969  . Pneumonia vaccines (1 of 2 - PCV13) 05/12/2015  . Flu Shot  08/11/2017*  . Eye exam for diabetics  07/16/2017  . Hemoglobin A1C  11/19/2017  . Mammogram  03/04/2019  . Colon Cancer Screening  09/27/2021  . DEXA scan (bone density measurement)  Completed  .  Hepatitis C: One time screening is recommended by Center for Disease Control  (CDC) for  adults born from [birth year] through 1966.   Completed  *Topic was postponed. The date shown is not the original due date.

## 2017-06-12 NOTE — Progress Notes (Signed)
Subjective:  Patient ID: Kari Lyons, female    DOB: [birth year]  Age: 66 y.o. MRN: [id number]  CC: The primary encounter diagnosis was Type 2 diabetes mellitus without complication, without long-term current use of insulin ([place]). Diagnoses of B12 deficiency, Essential hypertension, Class 1 obesity due to excess calories with serious comorbidity and body mass index (BMI) of 32.0 to 32.9 in adult, Irritable bowel syndrome with diarrhea, and B12 deficiency due to diet were also pertinent to this visit.  HPI Kari Lyons presents for follow up on new diagnosis of Type 2 Dm, diagnosed with A1c 6.5.    Patient attributes the diagnosis to chronic use of maxzide for  Hypertension and has stopped the  medication.    She has been checkign her fasting blood sugars and here readings have been < 125.  She recalls that historically she had recurrent hypoglycemia  Prior to coming to me for care.  .  Diet has been vegetarian for the past 2 years due to management of IBS. She has gained 20 lbs since August 2017 due to persistent foot pain that precluded her from exercising,  Her foot pain ha snow resolved,    Also noted to have a B12 level < 300 .  Not supplementing yet .  Has had IBS since 1986.  Has had Chronic undigested salad and greens in her stool ,  Started Pureeing her food, with normalization of stool appearance,   Feels she is absorbing more.     Outpatient Medications Prior to Visit  Medication Sig Dispense Refill  . Ascorbic Acid (VITAMIN C) 100 MG tablet Take 100 mg by mouth daily.    . blood glucose meter kit and supplies KIT Use once daily to check fasting and post prandial blood sugars 1 each 0  . cetirizine (ZYRTEC) 10 MG tablet Take 10 mg by mouth daily.    Marland Kitchen dicyclomine (BENTYL) 10 MG capsule Take 1 capsule (10 mg total) by mouth 3 (three) times daily before meals. 90 capsule 0  . famotidine (PEPCID) 20 MG tablet Take 1 tablet (20 mg total) by mouth 2 (two) times daily. 60 tablet 1   . fluticasone (FLONASE) 50 MCG/ACT nasal spray Place into both nostrils daily.    Marland Kitchen triamterene-hydrochlorothiazide (MAXZIDE-25) 37.5-25 MG tablet Take 1 tablet by mouth daily. 90 tablet 0   No facility-administered medications prior to visit.     Review of Systems;  Patient denies headache, fevers, malaise, unintentional weight loss, skin rash, eye pain, sinus congestion and sinus pain, sore throat, dysphagia,  hemoptysis , cough, dyspnea, wheezing, chest pain, palpitations, orthopnea, edema, abdominal pain, nausea, melena, diarrhea, constipation, flank pain, dysuria, hematuria, urinary  Frequency, nocturia, numbness, tingling, seizures,  Focal weakness, Loss of consciousness,  Tremor, insomnia, depression, anxiety, and suicidal ideation.      Objective:  BP (!) 146/78 (BP Location: Left Arm, Patient Position: Sitting, Cuff Size: Normal)   Pulse 73   Temp 98.3 F (36.8 C) (Oral)   Resp 16   Ht 5' 5" (1.651 m)   Wt 190 lb 6.4 oz (86.4 kg)   SpO2 96%   BMI 31.68 kg/m   BP Readings from Last 3 Encounters:  06/12/17 (!) 146/78  06/12/17 (!) 146/78  05/19/17 120/64    Wt Readings from Last 3 Encounters:  06/12/17 190 lb 6.4 oz (86.4 kg)  06/12/17 190 lb 6.4 oz (86.4 kg)  05/19/17 196 lb 12.8 oz (89.3 kg)    General appearance: alert, cooperative  and appears stated age Lungs: clear to auscultation bilaterally Heart: regular rate and rhythm, S1, S2 normal, no murmur, click, rub or gallop Abdomen: soft, non-tender; bowel sounds normal; no masses,  no organomegaly Pulses: 2+ and symmetric Skin: Skin color, texture, turgor normal. No rashes or lesions Foot exam:  Nails are well trimmed,  No callouses,  Sensation intact to microfilament  Lab Results  Component Value Date   HGBA1C 6.5 05/19/2017    Lab Results  Component Value Date   CREATININE 0.75 05/19/2017   CREATININE 0.77 08/11/2016   CREATININE 0.74 04/05/2016    Lab Results  Component Value Date   WBC 5.2  05/19/2017   HGB 12.5 05/19/2017   HCT 39.1 05/19/2017   PLT 223.0 05/19/2017   GLUCOSE 95 05/19/2017   CHOL 172 05/19/2017   TRIG 156.0 (H) 05/19/2017   HDL 43.80 05/19/2017   LDLCALC 97 05/19/2017   ALT 29 05/19/2017   AST 22 05/19/2017   NA 141 05/19/2017   K 3.8 05/19/2017   CL 101 05/19/2017   CREATININE 0.75 05/19/2017   BUN 11 05/19/2017   CO2 35 (H) 05/19/2017   TSH 1.82 05/19/2017   HGBA1C 6.5 05/19/2017   MICROALBUR 0.8 08/14/2015    Ct Cervical Spine Wo Contrast  Result Date: 05/30/2017 CLINICAL DATA:  Left neck and shoulder pain for several months without known injury. EXAM: CT CERVICAL SPINE WITHOUT CONTRAST TECHNIQUE: Multidetector CT imaging of the cervical spine was performed without intravenous contrast. Multiplanar CT image reconstructions were also generated. COMPARISON:  Radiographs of May 19, 2017. FINDINGS: Alignment: Normal. Skull base and vertebrae: No acute fracture. No primary bone lesion or focal pathologic process. Soft tissues and spinal canal: No prevertebral fluid or swelling. No visible canal hematoma. Disc levels: Severe degenerative disc disease is noted at C4-5, C5-6 and C6-7 with anterior osteophyte formation Upper chest: Negative. Other: None. IMPRESSION: Severe multilevel degenerative disc disease. No fracture or spondylolisthesis is noted. Lucency noted on prior radiographs over C5 most likely represented overlying artifact. Electronically Signed   By: Marijo Conception, M.D.   On: 05/30/2017 08:02    Assessment & Plan:   Problem List Items Addressed This Visit    B12 deficiency due to diet    She will need an intrinsic factor ab screen given concurrent gi issues. b12 injection deferred by patient today due to cost.       Essential hypertension    Previously managed with maxzide,  Stopped by patient dur to diagnosis of type 2 dm,  Losartan prescribed.  Return in one week for bmet       Relevant Medications   losartan (COZAAR) 25 MG tablet     Other Relevant Orders   Basic metabolic panel   IBS (irritable bowel syndrome)    Stools have improved with pureeing of greens.       Obesity, unspecified    I have addressed  BMI and recommended a low glycemic index diet utilizing smaller more frequent meals to increase metabolism.  I have also recommended that patient start exercising with a goal of 30 minutes of aerobic exercise a minimum of 5 days per week.       Type 2 diabetes mellitus without complication, without long-term current use of insulin ([place]) - Primary    preceded by recurrent hypoglycemia,  Per patient, historically,  No need for medications at this timee based on home glucose monitoring  . Repeat urine test is pending,  Will rx losartan  given need for bp control.  Reviewed role of diet and exercise.  Low gi list of foods given to her. RTC 3 months   Lab Results  Component Value Date   HGBA1C 6.5 05/19/2017   Lab Results  Component Value Date   MICROALBUR 0.8 08/14/2015         Relevant Medications   losartan (COZAAR) 25 MG tablet   Other Relevant Orders   Microalbumin / creatinine urine ratio    Other Visit Diagnoses    B12 deficiency       Relevant Orders   Intrinsic Factor Antibodies    A total of 40 minutes was spent with patient more than half of which was spent in counseling patient on the above mentioned issues , reviewing and explaining recent labs  and coordination of care.  I am having Ms. Efaw start on losartan. I am also having her maintain her vitamin C, cetirizine, fluticasone, triamterene-hydrochlorothiazide, dicyclomine, famotidine, blood glucose meter kit and supplies, Chromium, CINNAMON PO, (Ginger, Zingiber officinalis, (GINGER PO)), and PANTOTHENIC ACID PO.  Meds ordered this encounter  Medications  . Chromium 200 MCG CAPS    Sig: Take 1 capsule by mouth daily.  Marland Kitchen CINNAMON PO    Sig: Take 2 capsules by mouth daily.  . Ginger, Zingiber officinalis, (GINGER PO)    Sig: Take 1 capsule  by mouth daily.  Marland Kitchen PANTOTHENIC ACID PO    Sig: Take 1 capsule by mouth daily.  Marland Kitchen losartan (COZAAR) 25 MG tablet    Sig: Take 1 tablet (25 mg total) by mouth daily.    Dispense:  90 tablet    Refill:  1    There are no discontinued medications.  Follow-up: Return in about 3 months (around 09/12/2017) for follow up diabetes.   [name], MD

## 2017-06-12 NOTE — Progress Notes (Signed)
Subjective:   Kari Lyons is a 66 y.o. female who presents for an Initial Medicare Annual Wellness Visit.  Review of Systems    No ROS.  Medicare Wellness Visit. Additional risk factors are reflected in the social history.  Cardiac Risk Factors include: advanced age (>68mn, >>32women);diabetes mellitus;hypertension;obesity (BMI >30kg/m2)     Objective:    Today's Vitals   06/12/17 1529  BP: (!) 146/78  Pulse: 73  Resp: 14  Temp: 98.3 F (36.8 C)  TempSrc: Oral  SpO2: 96%  Weight: 190 lb 6.4 oz (86.4 kg)  Height: '5\' 5"'  (1.651 m)   Body mass index is 31.68 kg/m.   Current Medications (verified) Outpatient Encounter Prescriptions as of 06/12/2017  Medication Sig  . Ascorbic Acid (VITAMIN C) 100 MG tablet Take 100 mg by mouth daily.  . blood glucose meter kit and supplies KIT Use once daily to check fasting and post prandial blood sugars  . cetirizine (ZYRTEC) 10 MG tablet Take 10 mg by mouth daily.  . fluticasone (FLONASE) 50 MCG/ACT nasal spray Place into both nostrils daily.  .Marland Kitchendicyclomine (BENTYL) 10 MG capsule Take 1 capsule (10 mg total) by mouth 3 (three) times daily before meals.  . famotidine (PEPCID) 20 MG tablet Take 1 tablet (20 mg total) by mouth 2 (two) times daily.  .Marland Kitchentriamterene-hydrochlorothiazide (MAXZIDE-25) 37.5-25 MG tablet Take 1 tablet by mouth daily.   No facility-administered encounter medications on file as of 06/12/2017.     Allergies (verified) Aloe vera   History: Past Medical History:  Diagnosis Date  . Anemia    H/O   . Arrhythmia 2008  . Arthritis   . 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 2009: Reported workup at [facility] including ECHO, Stress Test and Cardiac Cath; records N/A.  ECHO with mild LVH, mild MVP; stress test and cath reportedly normal   . Depression   . Edema   . GERD (gastroesophageal reflux disease)   . Hypertension   . IBS (irritable bowel syndrome)   . Mitral valve prolapse   . Patient is  Jehovah's Witness   . Sleep apnea    MILD-NO CPAP    Past Surgical History:  Procedure Laterality Date  . ABDOMINAL HYSTERECTOMY  1985  . BILATERAL SALPINGOOPHORECTOMY  2002   due to fh of ovarian ca  . BREAST BIOPSY Right 03/09/2016   papilloma  . BREAST LUMPECTOMY Right 06/08/2016   Procedure: BREAST LUMPECTOMY WITH ULTRASOUND IN O.R;  Surgeon: Kari Lye MD;  Location: [facility] ORS;  Service: General;  Laterality: Right;  . BREAST SURGERY Left 02-26-13   subareloar duct excision and mass  . COLONOSCOPY  2012  . EXCISION / BIOPSY BREAST / NIPPLE / DUCT Right 1985   negative  . EXCISION / BIOPSY BREAST / NIPPLE / DUCT Left 02/26/2013   intraductal papilloma  . EXCISION / BIOPSY BREAST / NIPPLE / DUCT Right 06/09/2016  . TONSILLECTOMY     Family History  Problem Relation Age of Onset  . Hypertension Mother   . Arthritis Mother   . Parkinson's disease Father   . Hypertension Sister   . Heart disease Daughter   . Cancer Sister 468      ovarian ca  . Cancer Brother        multiple myeloma  . Diabetes Brother   . Cancer Maternal Grandmother        colon ca  . Breast cancer Paternal Grandmother 368 .  Bladder Cancer Paternal Uncle   . Prostate cancer Neg Hx   . Kidney cancer Neg Hx    Social History   Occupational History  . Not on file.   Social History Main Topics  . Smoking status: Never Smoker  . Smokeless tobacco: Never Used     Comment: smoked for 38month when 66yrold  . Alcohol use No  . Drug use: No  . Sexual activity: Not on file    Tobacco Counseling Counseling given: Not Answered   Activities of Daily Living In your present state of health, do you have any difficulty performing the following activities: 06/12/2017  Hearing? N  Vision? N  Difficulty concentrating or making decisions? N  Walking or climbing stairs? Y  Comment COPD.  SOB on exertion  Dressing or bathing? N  Doing errands, shopping? N  Preparing Food and eating ? N  Using  the Toilet? N  In the past six months, have you accidently leaked urine? N  Do you have problems with loss of bowel control? N  Managing your Medications? N  Managing your Finances? N  Housekeeping or managing your Housekeeping? N  Some recent data might be hidden    Immunizations and Health Maintenance  There is no immunization history on file for this patient. Health Maintenance Due  Topic Date Due  . FOOT EXAM  05/11/1960  . TETANUS/TDAP  05/11/1969  . PNA vac Low Risk Adult (1 of 2 - PCV13) 05/12/2015    Patient Care Team: Kari McMD as PCP - General (Internal Medicine) SaChristene LyeMD (General Surgery) Kari BilletMD (Internal Medicine)  Indicate any recent Medical Services you may have received from other than [facility] providers in the past year (date may be approximate).     Assessment:   This is a routine wellness examination for Kari Lyons. The goal of the wellness visit is to assist the patient how to close the gaps in care and create a preventative care plan for the patient.   The roster of all physicians providing medical care to patient is listed in the Snapshot section of the chart.  Taking calcium VIT D as appropriate/Osteoporosis risk reviewed.    Safety issues reviewed; Smoke and carbon monoxide detectors in the home. No firearms in the home.  Wears seatbelts when driving or riding with others. Patient does wear sunscreen or protective clothing when in direct sunlight. No violence in the home.  Depression- PHQ 2 &9 complete.  No signs/symptoms or verbal communication regarding little pleasure in doing things, feeling down, depressed or hopeless. No changes in sleeping, energy, eating, concentrating.  No thoughts of self harm or harm towards others.  Time spent on this topic is 8 minutes.   Patient is alert, normal appearance, oriented to person/place/and time.  Correctly identified the president of the [place] of 3/3 words, and  performing simple calculations. Displays appropriate judgement and can read correct time from watch face.   No new identified risk were noted.  No failures at ADL's or IADL's.    BMI- discussed the importance of a healthy diet, water intake and the benefits of aerobic exercise. Educational material provided.   24 hour diet recall: puree diet Breakfast: fruit smoothie Lunch: green vegetables  Dinner: tofu, rice  Daily fluid intake: 0 cups of caffeine,  cups of water  Dental- every 6 months.  Dr. MaDudley Major Sleep patterns- Sleeps 7-8 hours at night.  Wakes feeling rested.  TDAP vaccine deferred  per patient preference.  Follow up with insurance. Educational material provided.  Patient Concerns: None at this time. Follow up with PCP as needed.  Hearing/Vision screen Hearing Screening Comments: Patient is able to hear conversational tones without difficulty.  No issues reported.   Vision Screening Comments: Followed by [facility] Wears corrective lenses Last OV 2017 Visual acuity not assessed per patient preference since they have regular follow up with the ophthalmologist  Dietary issues and exercise activities discussed: Current Exercise Habits: Home exercise routine, Type of exercise: calisthenics;strength training/weights (dance), Time (Minutes): > 60, Frequency (Times/Week): 5, Weekly Exercise (Minutes/Week): 0, Intensity: Moderate  Goals    . Weight (lb) < 170 lb (77.1 kg)          Lose 20lbs Core exercises as demonstrated.  Additional educational material provided.       Depression Screen PHQ 2/9 Scores 05/19/2017 03/11/2016  PHQ - 2 Score 6 0  PHQ- 9 Score 12 -    Fall Risk Fall Risk  06/12/2017 05/19/2017 03/11/2016  Falls in the past year? Yes Yes No  Number falls in past yr: 1 1 -  Injury with Fall? Yes Yes -  Comment Hurt R knee but she did not seek medical attention. - -  Follow up Falls prevention discussed;Education provided - -    Cognitive  Function: MMSE - Mini Mental State Exam 06/12/2017  Orientation to time 5  Orientation to Place 5  Registration 3  Attention/ Calculation 5  Recall 3  Language- name 2 objects 2  Language- repeat 1  Language- follow 3 step command 3  Language- read & follow direction 1  Write a sentence 1  Copy design 1  Total score 30        Screening Tests Health Maintenance  Topic Date Due  . FOOT EXAM  05/11/1960  . TETANUS/TDAP  05/11/1969  . PNA vac Low Risk Adult (1 of 2 - PCV13) 05/12/2015  . INFLUENZA VACCINE  08/11/2017 (Originally 05/17/2017)  . OPHTHALMOLOGY EXAM  07/16/2017  . HEMOGLOBIN A1C  11/19/2017  . MAMMOGRAM  03/04/2019  . COLONOSCOPY  09/27/2021  . DEXA SCAN  Completed  . Hepatitis C Screening  Completed      Plan:    End of life planning; Advance aging; Advanced directives discussed. Copy of current HCPOA/Living Will on file.    I have personally reviewed and noted the following in the patient's chart:   . Medical and social history . Use of alcohol, tobacco or illicit drugs  . Current medications and supplements . Functional ability and status . Nutritional status . Physical activity . Advanced directives . List of other physicians . Hospitalizations, surgeries, and ER visits in previous 12 months . Vitals . Screenings to include cognitive, depression, and falls . Referrals and appointments  In addition, I have reviewed and discussed with patient certain preventive protocols, quality metrics, and best practice recommendations. A written personalized care plan for preventive services as well as general preventive health recommendations were provided to patient.     OBrien-Blaney, Hurshell Dino L, LPN   [id number]     I have reviewed the above information and agree with above.   Deborra Medina, MD

## 2017-06-12 NOTE — Patient Instructions (Addendum)
I want you to Check your sugars 2 hours after eating ( "post prandial."),  These should be < 160  We will check your Intrinsic factor antibody test and repeat your potassium and  kidney function test  In one, week since we are starting losartan .  Even among fruits and vegetables,  Certain ones are lower in sugar than others (refer to the list)

## 2017-06-13 DIAGNOSIS — E538 Deficiency of other specified B group vitamins: Secondary | ICD-10-CM | POA: Insufficient documentation

## 2017-06-13 LAB — MICROALBUMIN / CREATININE URINE RATIO
Creatinine,U: 39.3 mg/dL
Microalb Creat Ratio: 1.8 mg/g (ref 0.0–30.0)
Microalb, Ur: <0.7 mg/dL (ref 0.0–1.9)

## 2017-06-13 NOTE — Assessment & Plan Note (Signed)
I have addressed  BMI and recommended a low glycemic index diet utilizing smaller more frequent meals to increase metabolism.  I have also recommended that patient start exercising with a goal of 30 minutes of aerobic exercise a minimum of 5 days per week.  

## 2017-06-13 NOTE — Assessment & Plan Note (Addendum)
She will need an intrinsic factor ab screen given concurrent gi issues. b12 injection deferred by patient today due to cost.

## 2017-06-13 NOTE — Assessment & Plan Note (Addendum)
preceded by recurrent hypoglycemia,  Per patient, historically,  No need for medications at this timee based on home glucose monitoring  . Repeat urine test is pending,  Will rx losartan given need for bp control.  Reviewed role of diet and exercise.  Low gi list of foods given to her. RTC 3 months   Lab Results  Component Value Date   HGBA1C 6.5 05/19/2017   Lab Results  Component Value Date   MICROALBUR 0.8 08/14/2015

## 2017-06-13 NOTE — Assessment & Plan Note (Signed)
Previously managed with maxzide,  Stopped by patient dur to diagnosis of type 2 dm,  Losartan prescribed.  Return in one week for bmet

## 2017-06-13 NOTE — Assessment & Plan Note (Signed)
Stools have improved with pureeing of greens.

## 2017-06-14 ENCOUNTER — Ambulatory Visit (HOSPITAL_COMMUNITY): Payer: Medicare Other

## 2017-06-16 ENCOUNTER — Ambulatory Visit (HOSPITAL_COMMUNITY): Payer: Medicare Other

## 2017-06-20 ENCOUNTER — Ambulatory Visit (HOSPITAL_COMMUNITY)
Admission: RE | Admit: 2017-06-20 | Discharge: 2017-06-20 | Disposition: A | Discharge status: Home / Self Care | Payer: Medicare Other | Source: Ambulatory Visit | Attending: Internal Medicine | Admitting: Internal Medicine

## 2017-06-20 DIAGNOSIS — M5412 Radiculopathy, cervical region: Secondary | ICD-10-CM | POA: Insufficient documentation

## 2017-06-20 DIAGNOSIS — M1288 Other specific arthropathies, not elsewhere classified, other specified site: Secondary | ICD-10-CM | POA: Diagnosis not present

## 2017-06-20 DIAGNOSIS — M50222 Other cervical disc displacement at C5-C6 level: Secondary | ICD-10-CM | POA: Insufficient documentation

## 2017-06-20 DIAGNOSIS — M47812 Spondylosis without myelopathy or radiculopathy, cervical region: Secondary | ICD-10-CM | POA: Insufficient documentation

## 2017-06-20 DIAGNOSIS — M5030 Other cervical disc degeneration, unspecified cervical region: Secondary | ICD-10-CM | POA: Diagnosis present

## 2017-06-20 DIAGNOSIS — M4802 Spinal stenosis, cervical region: Secondary | ICD-10-CM | POA: Diagnosis not present

## 2017-06-20 DIAGNOSIS — M2578 Osteophyte, vertebrae: Secondary | ICD-10-CM | POA: Diagnosis not present

## 2017-06-20 DIAGNOSIS — M47892 Other spondylosis, cervical region: Secondary | ICD-10-CM | POA: Insufficient documentation

## 2017-06-23 ENCOUNTER — Telehealth: Payer: Self-pay | Admitting: Internal Medicine

## 2017-06-23 ENCOUNTER — Other Ambulatory Visit (INDEPENDENT_AMBULATORY_CARE_PROVIDER_SITE_OTHER): Payer: Medicare Other

## 2017-06-23 DIAGNOSIS — I10 Essential (primary) hypertension: Secondary | ICD-10-CM

## 2017-06-23 DIAGNOSIS — E538 Deficiency of other specified B group vitamins: Secondary | ICD-10-CM

## 2017-06-23 NOTE — Telephone Encounter (Signed)
Pt wanted to get a copy of her MRI. Please advise

## 2017-06-23 NOTE — Addendum Note (Signed)
Addended by: Leeanne Rio on: 06/23/2017 10:19 AM   Modules accepted: Orders

## 2017-06-25 ENCOUNTER — Other Ambulatory Visit: Payer: Self-pay | Admitting: Internal Medicine

## 2017-06-25 DIAGNOSIS — M50123 Cervical disc disorder at C6-C7 level with radiculopathy: Secondary | ICD-10-CM

## 2017-06-25 NOTE — Progress Notes (Signed)
amb ref to neuro

## 2017-06-27 LAB — INTRINSIC FACTOR ANTIBODIES: Intrinsic Factor: NEGATIVE

## 2017-06-27 LAB — BASIC METABOLIC PANEL WITH GFR
BUN: 10 mg/dL (ref 7–25)
CO2: 28 mmol/L (ref 20–32)
Calcium: 9.6 mg/dL (ref 8.6–10.4)
Chloride: 103 mmol/L (ref 98–110)
Creat: 0.69 mg/dL (ref 0.50–0.99)
GFR, Est African American: 104 mL/min/{1.73_m2} (ref >=60)
GFR, Est Non African American: 90 mL/min/{1.73_m2} (ref >=60)
Glucose, Bld: 111 mg/dL — ABNORMAL HIGH (ref 65–99)
Potassium: 3.8 mmol/L (ref 3.5–5.3)
Sodium: 140 mmol/L (ref 135–146)

## 2017-06-27 NOTE — Telephone Encounter (Signed)
Mri has been mailed.

## 2017-07-05 NOTE — Telephone Encounter (Signed)
Seen [phone] 

## 2017-08-14 ENCOUNTER — Telehealth: Payer: Self-pay | Admitting: Internal Medicine

## 2017-08-14 MED ORDER — GLUCOSE BLOOD VI STRP: ORAL_STRIP | 2 refills | Status: DC

## 2017-08-14 MED ORDER — ONETOUCH ULTRASOFT LANCETS MISC: 2 refills | Status: DC

## 2017-08-14 NOTE — Telephone Encounter (Signed)
rxs have been sent and pt is aware.

## 2017-08-14 NOTE — Telephone Encounter (Signed)
Pt needs rx for one touch ultra strips and lancets sent to [facility]

## 2017-08-15 ENCOUNTER — Telehealth: Payer: Self-pay

## 2017-08-15 MED ORDER — ONETOUCH DELICA LANCETS FINE MISC: 2 refills | Status: DC

## 2017-08-15 NOTE — Telephone Encounter (Signed)
Corrected rx was sent to [facility] on [address].

## 2017-08-16 ENCOUNTER — Other Ambulatory Visit: Payer: Self-pay

## 2017-08-16 MED ORDER — ONETOUCH DELICA LANCETS 33G MISC: 2 refills | Status: DC

## 2017-08-17 ENCOUNTER — Telehealth: Payer: Self-pay

## 2017-08-17 MED ORDER — ONETOUCH DELICA LANCETS 33G MISC: 2 refills | Status: DC

## 2017-08-17 NOTE — Telephone Encounter (Signed)
Directions of use corrected and resent to pharmacy

## 2017-09-15 ENCOUNTER — Ambulatory Visit: Payer: Medicare Other | Admitting: Internal Medicine

## 2017-09-15 ENCOUNTER — Encounter: Payer: Self-pay | Admitting: Internal Medicine

## 2017-09-15 VITALS — BP 140/82 | HR 66 | Temp 98.1°F | Resp 15 | Ht 65.0 in | Wt 174.0 lb

## 2017-09-15 DIAGNOSIS — Z23 Encounter for immunization: Secondary | ICD-10-CM | POA: Diagnosis not present

## 2017-09-15 DIAGNOSIS — M50123 Cervical disc disorder at C6-C7 level with radiculopathy: Secondary | ICD-10-CM

## 2017-09-15 DIAGNOSIS — E119 Type 2 diabetes mellitus without complications: Secondary | ICD-10-CM

## 2017-09-15 LAB — COMPREHENSIVE METABOLIC PANEL
ALT: 23 U/L (ref 0–35)
AST: 25 U/L (ref 0–37)
Albumin: 4 g/dL (ref 3.5–5.2)
Alkaline Phosphatase: 71 U/L (ref 39–117)
BUN: 13 mg/dL (ref 6–23)
CO2: 32 mEq/L (ref 19–32)
Calcium: 9.5 mg/dL (ref 8.4–10.5)
Chloride: 105 mEq/L (ref 96–112)
Creatinine, Ser: 0.77 mg/dL (ref 0.40–1.20)
GFR: 96.06 mL/min (ref >60.00)
Glucose, Bld: 104 mg/dL — ABNORMAL HIGH (ref 70–99)
Potassium: 4 mEq/L (ref 3.5–5.1)
Sodium: 140 mEq/L (ref 135–145)
Total Bilirubin: 0.5 mg/dL (ref 0.2–1.2)
Total Protein: 6.9 g/dL (ref 6.0–8.3)

## 2017-09-15 LAB — HEMOGLOBIN A1C: Hgb A1c MFr Bld: 6 % (ref 4.6–6.5)

## 2017-09-15 MED ORDER — ZOSTER VAC RECOMB ADJUVANTED 50 MCG/0.5ML IM SUSR: 0.5000 mL | Freq: Once | INTRAMUSCULAR | 1 refills | Status: AC

## 2017-09-15 NOTE — Patient Instructions (Addendum)
For your 3 hours snack:  KIND Bars:  Use the 100 cal snack size (the ones that say they are " low glycemic index"  Nuts are always a great low carb snack.  100 cal     I recommend that you  Take a baby aspirin once or twice a week as preventive measure against heart attack.  If it bothers your stomach,  Then stop    Diabetes Mellitus and Standards of Medical Care Managing diabetes (diabetes mellitus) can be complicated. Your diabetes treatment may be managed by a team of health care providers, including:  A diet and nutrition specialist (registered dietitian).  A nurse.  A certified diabetes educator (CDE).  A diabetes specialist (endocrinologist).  An eye doctor.  A primary care provider.  A dentist.  Your health care providers follow a schedule in order to help you get the best quality of care. The following schedule is a general guideline for your diabetes management plan. Your health care providers may also give you more specific instructions. HbA1c ( hemoglobin A1c) test This test provides information about blood sugar (glucose) control over the previous 2-3 months. It is used to check whether your diabetes management plan needs to be adjusted.  If you are meeting your treatment goals, this test is done at least 2 times a year.  If you are not meeting treatment goals or if your treatment goals have changed, this test is done 4 times a year.  Blood pressure test  This test is done at every routine medical visit. For most people, the goal is less than 130/80. Ask your health care provider what your goal blood pressure should be. Dental and eye exams  Visit your dentist two times a year.  If you have type 1 diabetes, get an eye exam 3-5 years after you are diagnosed, and then once a year after your first exam. ? If you were diagnosed with type 1 diabetes as a child, get an eye exam when you are age 58 or older and have had diabetes for 3-5 years. After the first exam,  you should get an eye exam once a year.  If you have type 2 diabetes, have an eye exam as soon as you are diagnosed, and then once a year after your first exam. Foot care exam  Visual foot exams are done at every routine medical visit. The exams check for cuts, bruises, redness, blisters, sores, or other problems with the feet.  A complete foot exam is done by your health care provider once a year. This exam includes an inspection of the structure and skin of your feet, and a check of the pulses and sensation in your feet. ? Type 1 diabetes: Get your first exam 3-5 years after diagnosis. ? Type 2 diabetes: Get your first exam as soon as you are diagnosed.  Check your feet every day for cuts, bruises, redness, blisters, or sores. If you have any of these or other problems that are not healing, contact your health care provider. Kidney function test ( urine microalbumin)  This test is done once a year. ? Type 1 diabetes: Get your first test 5 years after diagnosis. ? Type 2 diabetes: Get your first test as soon as you are diagnosed.  If you have chronic kidney disease (CKD), get a serum creatinine and estimated glomerular filtration rate (eGFR) test once a year. Lipid profile (cholesterol, HDL, LDL, triglycerides)  This test should be done when you are diagnosed with diabetes, and  every 5 years after the first test. If you are on medicines to lower your cholesterol, you may need to get this test done every year. ? The goal for LDL is less than 100 mg/dL (5.5 mmol/L). If you are at high risk, the goal is less than 70 mg/dL (3.9 mmol/L). ? The goal for HDL is 40 mg/dL (2.2 mmol/L) for men and 50 mg/dL(2.8 mmol/L) for women. An HDL cholesterol of 60 mg/dL (3.3 mmol/L) or higher gives some protection against heart disease. ? The goal for triglycerides is less than 150 mg/dL (8.3 mmol/L). Immunizations  The yearly flu (influenza) vaccine is recommended for everyone 6 months or older who has  diabetes.  The pneumonia (pneumococcal) vaccine is recommended for everyone 2 years or older who has diabetes. If you are 64 or older, you may get the pneumonia vaccine as a series of two separate shots.  The hepatitis B vaccine is recommended for adults shortly after they have been diagnosed with diabetes.  The Tdap (tetanus, diphtheria, and pertussis) vaccine should be given: ? According to normal childhood vaccination schedules, for children. ? Every 10 years, for adults who have diabetes.  The shingles vaccine is recommended for people who have had chicken pox and are 50 years or older. Mental and emotional health  Screening for symptoms of eating disorders, anxiety, and depression is recommended at the time of diagnosis and afterward as needed. If your screening shows that you have symptoms (you have a positive screening result), you may need further evaluation and be referred to a mental health care provider. Diabetes self-management education  Education about how to manage your diabetes is recommended at diagnosis and ongoing as needed. Treatment plan  Your treatment plan will be reviewed at every medical visit. Summary  Managing diabetes (diabetes mellitus) can be complicated. Your diabetes treatment may be managed by a team of health care providers.  Your health care providers follow a schedule in order to help you get the best quality of care.  Standards of care including having regular physical exams, blood tests, blood pressure monitoring, immunizations, screening tests, and education about how to manage your diabetes.  Your health care providers may also give you more specific instructions based on your individual health. This information is not intended to replace advice given to you by your health care provider. Make sure you discuss any questions you have with your health care provider. Document Released: 07/31/2009 Document Revised: 07/01/2016 Document Reviewed:  07/01/2016 Elsevier Interactive Patient Education  [employer].

## 2017-09-15 NOTE — Progress Notes (Signed)
Subjective:  Patient ID: Kari Lyons, female    DOB: 1951-03-22  Age: 66 y.o. MRN: [id number]  CC: The primary encounter diagnosis was Type 2 diabetes mellitus without complication, without long-term current use of insulin ([place]). 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  HPI Kari Lyons presents for 3 month follow up on newly diagnoseddiet controlled   diabetes.  Patient has no complaints today.  Patient is following a low glycemic index diet and taking all prescribed medications regularly without side effects.  Fasting sugars have been under less than 120 most of the time and post prandials have been under 160 except on rare occasions. Patient is exercising about 3 times per week and intentionally trying to lose weight .  Patient has had an eye exam in the last 12 months and checks feet regularly for signs of infection.  Patient does not walk barefoot outside,  And denies an numbness tingling or burning in feet. Patient is up to date on all recommended vaccinations  checking BS  Once daily  All are < 110  Lab Results  Component Value Date   MICROALBUR <0.7 06/12/2017    Fungus fist toenail.  Wants to avoid medication     Lab Results  Component Value Date   HGBA1C 6.0 09/15/2017   Has lost 22 lbs since august 3 .   Goal is 10 more lbs.   Outpatient Medications Prior to Visit  Medication Sig Dispense Refill  . Ascorbic Acid (VITAMIN C) 100 MG tablet Take 100 mg by mouth daily.    . blood glucose meter kit and supplies KIT Use once daily to check fasting and post prandial blood sugars 1 each 0  . cetirizine (ZYRTEC) 10 MG tablet Take 10 mg by mouth daily.    . Chromium 200 MCG CAPS Take 1 capsule by mouth daily.    Marland Kitchen CINNAMON PO Take 2 capsules by mouth daily.    . fluticasone (FLONASE) 50 MCG/ACT nasal spray Place into both nostrils daily.    Marland Kitchen glucose blood test strip  Use as instructed 100 each 2  . Lancets (ONETOUCH ULTRASOFT) lancets Use as instructed 100 each 2  . losartan (COZAAR) 25 MG tablet Take 1 tablet (25 mg total) by mouth daily. 90 tablet 1  . ONETOUCH DELICA LANCETS 99B MISC Use once daily to check fasting or post prandial blood sugars 100 each 2  . ONETOUCH DELICA LANCETS FINE MISC Use as directed 100 each 2  . PANTOTHENIC ACID PO Take 1 capsule by mouth daily.    Marland Kitchen dicyclomine (BENTYL) 10 MG capsule Take 1 capsule (10 mg total) by mouth 3 (three) times daily before meals. (Patient not taking: Reported on 09/15/2017) 90 capsule 0  . famotidine (PEPCID) 20 MG tablet Take 1 tablet (20 mg total) by mouth 2 (two) times daily. (Patient not taking: Reported on 09/15/2017) 60 tablet 1  . Ginger, Zingiber officinalis, (GINGER PO) Take 1 capsule by mouth daily.    Marland Kitchen triamterene-hydrochlorothiazide (MAXZIDE-25) 37.5-25 MG tablet Take 1 tablet by mouth daily. (Patient not taking: Reported on 09/15/2017) 90 tablet 0   No facility-administered medications prior to visit.     Review of Systems;  Patient denies headache, fevers, malaise, unintentional weight loss, skin rash, eye pain, sinus congestion and sinus pain, sore throat, dysphagia,  hemoptysis , cough, dyspnea, wheezing, chest pain, palpitations, orthopnea, edema, abdominal pain, nausea, melena,  diarrhea, constipation, flank pain, dysuria, hematuria, urinary  Frequency, nocturia, numbness, tingling, seizures,  Focal weakness, Loss of consciousness,  Tremor, insomnia, depression, anxiety, and suicidal ideation.      Objective:  BP 140/82 (BP Location: Left Arm, Patient Position: Sitting, Cuff Size: Normal)   Pulse 66   Temp 98.1 F (36.7 C) (Oral)   Resp 15   Ht _0  (1.651 m)   Wt 174 lb (78.9 kg)   SpO2 96%   BMI 28.96 kg/m   BP Readings from Last 3 Encounters:  09/15/17 140/82  06/12/17 (!) 146/78  06/12/17 (!) 146/78    Wt Readings from Last 3 Encounters:  09/15/17 174 lb (78.9  kg)  06/12/17 190 lb 6.4 oz (86.4 kg)  06/12/17 190 lb 6.4 oz (86.4 kg)    General appearance: alert, cooperative and appears stated age Ears: normal TM's and external ear canals both ears Throat: lips, mucosa, and tongue normal; teeth and gums normal Neck: no adenopathy, no carotid bruit, supple, symmetrical, trachea midline and thyroid not enlarged, symmetric, no tenderness/mass/nodules Back: symmetric, no curvature. ROM normal. No CVA tenderness. Lungs: clear to auscultation bilaterally Heart: regular rate and rhythm, S1, S2 normal, no murmur, click, rub or gallop Abdomen: soft, non-tender; bowel sounds normal; no masses,  no organomegaly Pulses: 2+ and symmetric Skin: Skin color, texture, turgor normal. No rashes or lesions Lymph nodes: Cervical, supraclavicular, and axillary nodes normal.  Lab Results  Component Value Date   HGBA1C 6.0 09/15/2017   HGBA1C 6.5 05/19/2017    Lab Results  Component Value Date   CREATININE 0.77 09/15/2017   CREATININE 0.69 06/23/2017   CREATININE 0.75 05/19/2017    Lab Results  Component Value Date   WBC 5.2 05/19/2017   HGB 12.5 05/19/2017   HCT 39.1 05/19/2017   PLT 223.0 05/19/2017   GLUCOSE 104 (H) 09/15/2017   CHOL 172 05/19/2017   TRIG 156.0 (H) 05/19/2017   HDL 43.80 05/19/2017   LDLCALC 97 05/19/2017   ALT 23 09/15/2017   AST 25 09/15/2017   NA 140 09/15/2017   K 4.0 09/15/2017   CL 105 09/15/2017   CREATININE 0.77 09/15/2017   BUN 13 09/15/2017   CO2 32 09/15/2017   TSH 1.82 05/19/2017   HGBA1C 6.0 09/15/2017   MICROALBUR <0.7 06/12/2017    Mr Cervical Spine Wo Contrast  Result Date: 06/20/2017 CLINICAL DATA:  Left arm numbness and neck pain for 2 months EXAM: MRI CERVICAL SPINE WITHOUT CONTRAST TECHNIQUE: Multiplanar, multisequence MR imaging of the cervical spine was performed. No intravenous contrast was administered. COMPARISON:  None. FINDINGS: Alignment: Physiologic. Vertebrae: No fracture, evidence of discitis,  or bone lesion. Cord: Normal signal and morphology. Posterior Fossa, vertebral arteries, paraspinal tissues: Posterior fossa demonstrates no focal abnormality. Vertebral artery flow voids are maintained. Paraspinal soft tissues are unremarkable. Disc levels: Discs: Degenerative disc disease with disc height loss at C4-5, C5-6 and C6-7. C2-3: No significant disc bulge. No neural foraminal stenosis. No central canal stenosis. C3-4: No significant disc bulge. Moderate left facet arthropathy with left foraminal stenosis. No central canal stenosis. C4-5: Broad-based disc osteophyte complex. Bilateral uncovertebral degenerative changes. Moderate left foraminal stenosis. No central canal stenosis. C5-6: Broad-based disc bulge with a central disc protrusion contacting the ventral cervical spinal cord. Moderate left facet arthropathy and left uncovertebral degenerative changes with severe left foraminal stenosis. Mild right foraminal stenosis. Mild spinal stenosis. C6-7: Broad-based disc bulge. Moderate right foraminal stenosis. No left foraminal stenosis. No central canal stenosis.  C7-T1: Mild broad-based disc bulge. Mild right and moderate left facet arthropathy. Moderate left foraminal stenosis. No right foraminal stenosis. No central canal stenosis. IMPRESSION: 1. Diffuse cervical spine spondylosis as described above. 2. At C5-6 there is a broad-based disc bulge with a central disc protrusion contacting the ventral cervical spinal cord. Moderate left facet arthropathy and left uncovertebral degenerative changes with severe left foraminal stenosis. Mild right foraminal stenosis. Mild spinal stenosis. 3. At C4-5 there is a broad-based disc osteophyte complex. Bilateral uncovertebral degenerative changes. Moderate left foraminal stenosis. Electronically Signed   By: Kathreen Devoid   On: 06/20/2017 09:36    Assessment & Plan:   Problem List Items Addressed This Visit    Cervical disc disorder at C6-C7 level with  radiculopathy    Her cervical spine MRI has confirmed that  advanced  degenerative changes has created spinal stenosis with left arm radicular pain that is intermittent and not accompanied by weakness.  Has had neurosurgical evaluation:  PT and traction recommended as initial therapy      Type 2 diabetes mellitus without complication, without long-term current use of insulin ([facility]) - Primary    Impressive  reduction with diet alone from 6.5 to 6.0. Advised to start a baby aspirin daily if tolerated.   Advised to avoid fasting states to avoid hypoglycemia.  RTC 3 months   Lab Results  Component Value Date   HGBA1C 6.0 09/15/2017   Lab Results  Component Value Date   MICROALBUR <0.7 06/12/2017         Relevant Orders   Hemoglobin A1c (Completed)   Comprehensive metabolic panel (Completed)    Other Visit Diagnoses    Need for immunization against influenza       Relevant Orders   Flu Vaccine QUAD 36+ mos IM (Completed)     A total of 25 minutes of face to face time was spent with patient more than half of which was spent in counselling about the above mentioned conditions  and coordination of care  I have discontinued Jemmie M. Hattery's triamterene-hydrochlorothiazide and (Ginger, Zingiber officinalis, (GINGER PO)). I am also having her start on Zoster Vaccine Adjuvanted. Additionally, I am having her maintain her vitamin C, cetirizine, fluticasone, dicyclomine, famotidine, blood glucose meter kit and supplies, Chromium, CINNAMON PO, PANTOTHENIC ACID PO, losartan, glucose blood, onetouch ultrasoft, ONETOUCH DELICA LANCETS FINE, and ONETOUCH DELICA LANCETS 58N.  Meds ordered this encounter  Medications  . Zoster Vaccine Adjuvanted [facility]) injection    Sig: Inject 0.5 mLs into the muscle once for 1 dose.    Dispense:  1 each    Refill:  1    Medications Discontinued During This Encounter  Medication Reason  . Ginger, Zingiber officinalis, (GINGER PO) Patient has not taken in  last 30 days  . triamterene-hydrochlorothiazide (MAXZIDE-25) 37.5-25 MG tablet Patient has not taken in last 30 days    Follow-up: Return in about 3 months (around 12/14/2017) for follow up diabetes.   [name], MD

## 2017-09-17 NOTE — Assessment & Plan Note (Addendum)
Her cervical spine MRI has confirmed that  advanced  degenerative changes has created spinal stenosis with left arm radicular pain that is intermittent and not accompanied by weakness.  Has had neurosurgical evaluation:  PT and traction recommended as initial therapy

## 2017-09-17 NOTE — Assessment & Plan Note (Addendum)
Impressive  reduction with diet alone from 6.5 to 6.0. Advised to start a baby aspirin daily if tolerated.   Advised to avoid fasting states to avoid hypoglycemia.  RTC 3 months   Lab Results  Component Value Date   HGBA1C 6.0 09/15/2017   Lab Results  Component Value Date   MICROALBUR <0.7 06/12/2017

## 2017-11-14 ENCOUNTER — Telehealth: Payer: Self-pay | Admitting: Internal Medicine

## 2017-11-14 MED ORDER — LOSARTAN POTASSIUM 25 MG PO TABS: 25.0000 mg | ORAL_TABLET | Freq: Every day | ORAL | 1 refills | Status: DC

## 2017-11-14 MED ORDER — GLUCOSE BLOOD VI STRP: ORAL_STRIP | 12 refills | Status: DC

## 2017-11-14 MED ORDER — ONETOUCH DELICA LANCETS 33G MISC: 2 refills | Status: DC

## 2017-11-14 NOTE — Telephone Encounter (Signed)
Pt needs refills on the following sent to [place]  losartan (COZAAR) 25 MG tablet One touch ultra blue test strips One touch Delica lancets extra fine 33 gauge   Pt is completley out

## 2017-11-14 NOTE — Telephone Encounter (Signed)
rxs have been refilled and sent to [facility] on [address].

## 2017-12-25 ENCOUNTER — Telehealth: Payer: Self-pay | Admitting: Radiology

## 2017-12-25 DIAGNOSIS — E119 Type 2 diabetes mellitus without complications: Secondary | ICD-10-CM

## 2017-12-25 DIAGNOSIS — I10 Essential (primary) hypertension: Secondary | ICD-10-CM

## 2017-12-25 DIAGNOSIS — E538 Deficiency of other specified B group vitamins: Secondary | ICD-10-CM

## 2017-12-25 NOTE — Telephone Encounter (Signed)
ordered

## 2017-12-25 NOTE — Telephone Encounter (Signed)
Lab Results  Component Value Date   TSH 1.82 05/19/2017

## 2017-12-25 NOTE — Addendum Note (Signed)
Addended by: [name] on: 12/25/2017 11:20 AM   Modules accepted: Orders

## 2017-12-25 NOTE — Telephone Encounter (Signed)
Pt coming in for labs tomorrow, please place future orders. Thank you.  

## 2017-12-26 ENCOUNTER — Other Ambulatory Visit (INDEPENDENT_AMBULATORY_CARE_PROVIDER_SITE_OTHER): Payer: Medicare Other

## 2017-12-26 DIAGNOSIS — E538 Deficiency of other specified B group vitamins: Secondary | ICD-10-CM | POA: Diagnosis not present

## 2017-12-26 DIAGNOSIS — E119 Type 2 diabetes mellitus without complications: Secondary | ICD-10-CM | POA: Diagnosis not present

## 2017-12-26 DIAGNOSIS — I10 Essential (primary) hypertension: Secondary | ICD-10-CM | POA: Diagnosis not present

## 2017-12-26 LAB — COMPREHENSIVE METABOLIC PANEL
ALT: 18 U/L (ref 0–35)
AST: 20 U/L (ref 0–37)
Albumin: 4.1 g/dL (ref 3.5–5.2)
Alkaline Phosphatase: 62 U/L (ref 39–117)
BUN: 8 mg/dL (ref 6–23)
CO2: 34 mEq/L — ABNORMAL HIGH (ref 19–32)
Calcium: 9.9 mg/dL (ref 8.4–10.5)
Chloride: 103 mEq/L (ref 96–112)
Creatinine, Ser: 0.82 mg/dL (ref 0.40–1.20)
GFR: 89.26 mL/min (ref >60.00)
Glucose, Bld: 93 mg/dL (ref 70–99)
Potassium: 4.2 mEq/L (ref 3.5–5.1)
Sodium: 142 mEq/L (ref 135–145)
Total Bilirubin: 0.7 mg/dL (ref 0.2–1.2)
Total Protein: 7 g/dL (ref 6.0–8.3)

## 2017-12-26 LAB — LIPID PANEL
Cholesterol: 134 mg/dL (ref 0–200)
HDL: 52.4 mg/dL (ref >39.00)
LDL Cholesterol: 66 mg/dL (ref 0–99)
NonHDL: 82.02
Total CHOL/HDL Ratio: 3
Triglycerides: 78 mg/dL (ref 0.0–149.0)
VLDL: 15.6 mg/dL (ref 0.0–40.0)

## 2017-12-26 LAB — HEMOGLOBIN A1C: Hgb A1c MFr Bld: 6 % (ref 4.6–6.5)

## 2017-12-26 LAB — VITAMIN B12: Vitamin B-12: 202 pg/mL — ABNORMAL LOW (ref 211–911)

## 2017-12-26 NOTE — Addendum Note (Signed)
Addended by: Leeanne Rio on: 12/26/2017 08:59 AM   Modules accepted: Orders

## 2017-12-29 ENCOUNTER — Ambulatory Visit: Payer: Medicare Other | Admitting: Internal Medicine

## 2017-12-29 ENCOUNTER — Encounter: Payer: Self-pay | Admitting: Internal Medicine

## 2017-12-29 VITALS — BP 156/94 | HR 67 | Temp 97.7°F | Resp 15 | Ht 65.0 in | Wt 177.6 lb

## 2017-12-29 DIAGNOSIS — J329 Chronic sinusitis, unspecified: Secondary | ICD-10-CM | POA: Diagnosis not present

## 2017-12-29 DIAGNOSIS — E538 Deficiency of other specified B group vitamins: Secondary | ICD-10-CM

## 2017-12-29 DIAGNOSIS — I10 Essential (primary) hypertension: Secondary | ICD-10-CM | POA: Diagnosis not present

## 2017-12-29 DIAGNOSIS — E119 Type 2 diabetes mellitus without complications: Secondary | ICD-10-CM | POA: Diagnosis not present

## 2017-12-29 LAB — MICROALBUMIN / CREATININE URINE RATIO
Creatinine,U: 19.3 mg/dL
Microalb Creat Ratio: 3.6 mg/g (ref 0.0–30.0)
Microalb, Ur: <0.7 mg/dL (ref 0.0–1.9)

## 2017-12-29 MED ORDER — CYANOCOBALAMIN 1000 MCG/ML IJ SOLN
1000.0000 ug | Freq: Once | INTRAMUSCULAR | Status: AC
  Administered 2017-12-29: 1000 ug via INTRAMUSCULAR

## 2017-12-29 MED ORDER — LOSARTAN POTASSIUM 50 MG PO TABS: 50.0000 mg | ORAL_TABLET | Freq: Every day | ORAL | 0 refills | Status: DC

## 2017-12-29 MED ORDER — CYANOCOBALAMIN 1000 MCG/ML IJ SOLN: INTRAMUSCULAR | 0 refills | Status: DC

## 2017-12-29 MED ORDER — "SYRINGE 25G X 1"" 3 ML MISC": 0 refills | Status: DC

## 2017-12-29 NOTE — Progress Notes (Signed)
Subjective:  Patient ID: Kari Lyons, female    DOB: [birth year]  Age: 67 y.o. MRN: [id number]  CC: The primary encounter diagnosis was B12 deficiency due to diet. Diagnoses of Type 2 diabetes mellitus without complication, without long-term current use of insulin ([place]) and Essential hypertension were also pertinent to this visit.  HPI Kari Lyons presents for  3 month follow up on diabetes.  Patient has no complaints today.  Patient is following a low glycemic index diet and taking all prescribed medications regularly without side effects.  Fasting sugars have been under less than 140 most of the time and post prandials have been under 160 except on rare occasions. Patient is exercising about 3 times per week and intentionally trying to lose weight .  Patient has had an eye exam in the last 12 months and checks feet regularly for signs of infection.  Patient does not walk barefoot outside,  And denies an numbness tingling or burning in feet. Patient is up to date on all recommended vaccinations  b12 found to be low . Not taking a supplement  Not exercising regularly due to decreased mood attributed to "winter blues"  RECURRENT SINUS CONGESTION resulting in dizziness and malaise uses Nettie Pott on a daily basis with medicated  Salts, and also using zyrtec and flonase. Drainage is clear but "thick" and gags her.  Saw [facility] [facility] last year , was told everything was fine.  States that her face over her  maxillary sinuses has been feeling numb  intermittently whenever her sinuses become more congested.    Outpatient Medications Prior to Visit  Medication Sig Dispense Refill  . Ascorbic Acid (VITAMIN C) 100 MG tablet Take 100 mg by mouth daily.    . blood glucose meter kit and supplies KIT Use once daily to check fasting and post prandial blood sugars 1 each 0  . cetirizine (ZYRTEC) 10 MG tablet Take 10 mg by mouth daily.    . Chromium 200 MCG CAPS Take 1 capsule by mouth daily.    Marland Kitchen  CINNAMON PO Take 2 capsules by mouth daily.    Marland Kitchen dicyclomine (BENTYL) 10 MG capsule Take 1 capsule (10 mg total) by mouth 3 (three) times daily before meals. 90 capsule 0  . famotidine (PEPCID) 20 MG tablet Take 1 tablet (20 mg total) by mouth 2 (two) times daily. 60 tablet 1  . fluticasone (FLONASE) 50 MCG/ACT nasal spray Place into both nostrils daily.    Marland Kitchen glucose blood test strip Use as instructed 100 each 2  . glucose blood test strip Use as instructed 100 each 12  . ONETOUCH DELICA LANCETS 32I MISC Use once daily to check fasting or post prandial blood sugars 100 each 2  . ONETOUCH DELICA LANCETS FINE MISC Use as directed 100 each 2  . PANTOTHENIC ACID PO Take 1 capsule by mouth daily.    Marland Kitchen losartan (COZAAR) 25 MG tablet Take 1 tablet (25 mg total) by mouth daily. 90 tablet 1   No facility-administered medications prior to visit.     Review of Systems;  Patient denies headache, fevers, malaise, unintentional weight loss, skin rash, eye pain, sinus congestion and sinus pain, sore throat, dysphagia,  hemoptysis , cough, dyspnea, wheezing, chest pain, palpitations, orthopnea, edema, abdominal pain, nausea, melena, diarrhea, constipation, flank pain, dysuria, hematuria, urinary  Frequency, nocturia, numbness, tingling, seizures,  Focal weakness, Loss of consciousness,  Tremor, insomnia, depression, anxiety, and suicidal ideation.      Objective:  BP (!) 156/94 (BP Location: Left Arm, Patient Position: Sitting, Cuff Size: Normal)   Pulse 67   Temp 97.7 F (36.5 C) (Oral)   Resp 15   Ht '5\' 5"'  (1.651 m)   Wt 177 lb 9.6 oz (80.6 kg)   SpO2 97%   BMI 29.55 kg/m   BP Readings from Last 3 Encounters:  12/29/17 (!) 156/94  09/15/17 140/82  06/12/17 (!) 146/78    Wt Readings from Last 3 Encounters:  12/29/17 177 lb 9.6 oz (80.6 kg)  09/15/17 174 lb (78.9 kg)  06/12/17 190 lb 6.4 oz (86.4 kg)    General appearance: alert, cooperative and appears stated age Ears: normal TM's and  external ear canals both ears Throat: lips, mucosa, and tongue normal; teeth and gums normal Neck: no adenopathy, no carotid bruit, supple, symmetrical, trachea midline and thyroid not enlarged, symmetric, no tenderness/mass/nodules Back: symmetric, no curvature. ROM normal. No CVA tenderness. Lungs: clear to auscultation bilaterally Heart: regular rate and rhythm, S1, S2 normal, no murmur, click, rub or gallop Abdomen: soft, non-tender; bowel sounds normal; no masses,  no organomegaly Pulses: 2+ and symmetric Skin: Skin color, texture, turgor normal. No rashes or lesions Lymph nodes: Cervical, supraclavicular, and axillary nodes normal.  Lab Results  Component Value Date   HGBA1C 6.0 12/26/2017   HGBA1C 6.0 09/15/2017   HGBA1C 6.5 05/19/2017    Lab Results  Component Value Date   CREATININE 0.82 12/26/2017   CREATININE 0.77 09/15/2017   CREATININE 0.69 06/23/2017    Lab Results  Component Value Date   WBC 5.2 05/19/2017   HGB 12.5 05/19/2017   HCT 39.1 05/19/2017   PLT 223.0 05/19/2017   GLUCOSE 93 12/26/2017   CHOL 134 12/26/2017   TRIG 78.0 12/26/2017   HDL 52.40 12/26/2017   LDLCALC 66 12/26/2017   ALT 18 12/26/2017   AST 20 12/26/2017   NA 142 12/26/2017   K 4.2 12/26/2017   CL 103 12/26/2017   CREATININE 0.82 12/26/2017   BUN 8 12/26/2017   CO2 34 (H) 12/26/2017   TSH 1.82 05/19/2017   HGBA1C 6.0 12/26/2017   MICROALBUR <0.7 12/29/2017     Assessment & Plan:   Problem List Items Addressed This Visit    B12 deficiency due to diet - Primary    Persistently low despite oral supplementation that was started in September. Intrinsic factor ab was negative. Starting b12 injections. Lab Results  Component Value Date   VITAMINB12 202 (L) 12/26/2017         Relevant Medications   cyanocobalamin ((VITAMIN B-12)) injection 1,000 mcg (Completed)   Essential hypertension    Previously managed with maxzide,  Stopped by patient due to diagnosis of type 2 dm,   Losartan prescribed at initial dose of 25 mg .  Increasing dose today to 50 mg.   Lab Results  Component Value Date   CREATININE 0.82 12/26/2017   Lab Results  Component Value Date   NA 142 12/26/2017   K 4.2 12/26/2017   CL 103 12/26/2017   CO2 34 (H) 12/26/2017         Relevant Medications   losartan (COZAAR) 50 MG tablet   Type 2 diabetes mellitus without complication, without long-term current use of insulin ([place])    Impressive  Management with diet alone .  Advised to start a baby aspirin daily if tolerated.   Advised to avoid fasting states to avoid hypoglycemia.  RTC 3 months   Lab Results  Component  Value Date   HGBA1C 6.0 12/26/2017   Lab Results  Component Value Date   MICROALBUR <0.7 12/29/2017         Relevant Medications   losartan (COZAAR) 50 MG tablet      I have changed Janyah M. Champeau's losartan. I am also having her start on cyanocobalamin and SYRINGE 3CC/25GX1". Additionally, I am having her maintain her vitamin C, cetirizine, fluticasone, dicyclomine, famotidine, blood glucose meter kit and supplies, Chromium, CINNAMON PO, PANTOTHENIC ACID PO, glucose blood, ONETOUCH DELICA LANCETS FINE, ONETOUCH DELICA LANCETS 04B, and glucose blood. We administered cyanocobalamin.  Meds ordered this encounter  Medications  . losartan (COZAAR) 50 MG tablet    Sig: Take 1 tablet (50 mg total) by mouth daily.    Dispense:  90 tablet    Refill:  0    NOTE DOSE INCREASE  . cyanocobalamin (,VITAMIN B-12,) 1000 MCG/ML injection    Sig: 1000 mcg (1 mL) Im injection weekly x 3,  Then monthly thereafter    Dispense:  10 mL    Refill:  0  . Syringe/Needle, Disp, (SYRINGE 3CC/25GX1") 25G X 1" 3 ML MISC    Sig: Use for b12 injections    Dispense:  50 each    Refill:  0  . cyanocobalamin ((VITAMIN B-12)) injection 1,000 mcg    Medications Discontinued During This Encounter  Medication Reason  . losartan (COZAAR) 25 MG tablet Reorder    Follow-up: Return in about  6 months (around 07/01/2018) for follow up diabetes.   [name], MD

## 2017-12-29 NOTE — Patient Instructions (Addendum)
Try adding 12.5 to 25 mg of benadryl at bedtime (dipendhydramine)  referral to ENT   We are increasing the losartan dose to 50 mg daily for your blood pressure  We found a cause for your fatigue.  Your  ARE  b12 deficient. Likely because of your diet being low in animal protein.   You should have a weekly  IM injection  Of 1 Ml (1000 mcg) for 4 weeks,  The hen ,  continue  to self administer monthly injections .  If you are not .comfortable doing this,  You can either return here for the injections or start taking a daily  B12  Supplement, 1000 mcg or  2500 mcg   Daily    Your diabetes remains under excellent control  And your cholesterol and other labs have improved or are also normal. Please continue your current medications. return in 6 months for follow up on diabetes and make sure you are seeing your eye doctor at least once a year for a dilated retina exam to monitor for diabetic retinopathy,. changes that can lead to blindness .

## 2017-12-29 NOTE — Addendum Note (Signed)
Addended by: Leeanne Rio on: 12/29/2017 10:28 AM   Modules accepted: Orders

## 2017-12-31 NOTE — Assessment & Plan Note (Signed)
Impressive  Management with diet alone .  Advised to start a baby aspirin daily if tolerated.   Advised to avoid fasting states to avoid hypoglycemia.  RTC 3 months   Lab Results  Component Value Date   HGBA1C 6.0 12/26/2017   Lab Results  Component Value Date   MICROALBUR <0.7 12/29/2017

## 2017-12-31 NOTE — Assessment & Plan Note (Signed)
Previously managed with maxzide,  Stopped by patient due to diagnosis of type 2 dm,  Losartan prescribed at initial dose of 25 mg .  Increasing dose today to 50 mg.   Lab Results  Component Value Date   CREATININE 0.82 12/26/2017   Lab Results  Component Value Date   NA 142 12/26/2017   K 4.2 12/26/2017   CL 103 12/26/2017   CO2 34 (H) 12/26/2017

## 2017-12-31 NOTE — Assessment & Plan Note (Addendum)
Persistently low despite oral supplementation that was started in September. Intrinsic factor ab was negative. Starting b12 injections. Lab Results  Component Value Date   VITAMINB12 202 (L) 12/26/2017

## 2018-01-04 ENCOUNTER — Other Ambulatory Visit: Payer: Self-pay

## 2018-01-04 DIAGNOSIS — Z1231 Encounter for screening mammogram for malignant neoplasm of breast: Secondary | ICD-10-CM

## 2018-01-23 ENCOUNTER — Other Ambulatory Visit: Payer: Self-pay | Admitting: Unknown Physician Specialty

## 2018-01-23 DIAGNOSIS — R202 Paresthesia of skin: Principal | ICD-10-CM

## 2018-01-23 DIAGNOSIS — R200 Anesthesia of skin: Secondary | ICD-10-CM

## 2018-02-07 ENCOUNTER — Ambulatory Visit
Admission: RE | Admit: 2018-02-07 | Discharge: 2018-02-07 | Disposition: A | Discharge status: Home / Self Care | Payer: Medicare Other | Source: Ambulatory Visit | Attending: Unknown Physician Specialty | Admitting: Unknown Physician Specialty

## 2018-02-07 DIAGNOSIS — J322 Chronic ethmoidal sinusitis: Secondary | ICD-10-CM | POA: Diagnosis not present

## 2018-02-07 DIAGNOSIS — R200 Anesthesia of skin: Secondary | ICD-10-CM | POA: Insufficient documentation

## 2018-02-07 DIAGNOSIS — R202 Paresthesia of skin: Secondary | ICD-10-CM | POA: Diagnosis present

## 2018-02-07 MED ORDER — GADOBENATE DIMEGLUMINE 529 MG/ML IV SOLN
16.0000 mL | Freq: Once | INTRAVENOUS | Status: AC | PRN
  Administered 2018-02-07: 16 mL via INTRAVENOUS

## 2018-02-16 LAB — HM DIABETES EYE EXAM

## 2018-03-05 ENCOUNTER — Ambulatory Visit
Admission: RE | Admit: 2018-03-05 | Discharge: 2018-03-05 | Disposition: A | Discharge status: Home / Self Care | Payer: Medicare Other | Source: Ambulatory Visit | Attending: General Surgery | Admitting: General Surgery

## 2018-03-05 DIAGNOSIS — Z1231 Encounter for screening mammogram for malignant neoplasm of breast: Secondary | ICD-10-CM | POA: Diagnosis not present

## 2018-03-13 ENCOUNTER — Ambulatory Visit: Payer: Medicare Other | Admitting: General Surgery

## 2018-03-27 ENCOUNTER — Encounter: Payer: Self-pay | Admitting: General Surgery

## 2018-03-27 ENCOUNTER — Ambulatory Visit: Payer: Medicare Other | Admitting: General Surgery

## 2018-03-27 VITALS — BP 142/80 | HR 69 | Resp 13 | Ht 65.0 in | Wt 181.0 lb

## 2018-03-27 DIAGNOSIS — Z1231 Encounter for screening mammogram for malignant neoplasm of breast: Secondary | ICD-10-CM | POA: Diagnosis not present

## 2018-03-27 DIAGNOSIS — D369 Benign neoplasm, unspecified site: Secondary | ICD-10-CM | POA: Diagnosis not present

## 2018-03-27 NOTE — Patient Instructions (Addendum)
The patient is aware to call back for any questions or concerns. Patient will be asked to return to the office in one year with a bilateral screening mammogram. 

## 2018-03-27 NOTE — Progress Notes (Signed)
Patient ID: Kari Lyons, female   DOB: 11/26/1950, 67 y.o.   MRN: [id number]  Chief Complaint  Patient presents with  . Follow-up    HPI Kari Lyons is a 67 y.o. female.  who presents for a breast evaluation, former patient of Dr Kari Lyons. The most recent mammogram was done on 03-05-18. She had a right lumpectomy for excision papilloma on 06-08-16. Patient does randomly perform regular self breast checks and gets regular mammograms done. No new breast issues.  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  HPI  Past Medical History:  Diagnosis Date  . Anemia    H/O   . Arrhythmia 2008  . Arthritis   . 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 2009: Reported workup at [place] including ECHO, Stress Test and Cardiac Cath; records N/A.  ECHO with mild LVH, mild MVP; stress test and cath reportedly normal   . Depression   . Edema   . GERD (gastroesophageal reflux disease)   . Hypertension   . IBS (irritable bowel syndrome)   . Mitral valve prolapse   . Patient is Jehovah's Witness   . Sleep apnea    MILD-NO CPAP     Past Surgical History:  Procedure Laterality Date  . ABDOMINAL HYSTERECTOMY  1985  . BILATERAL SALPINGOOPHORECTOMY  2002   due to fh of ovarian ca  . BREAST BIOPSY Right 03/09/2016   papilloma  . BREAST LUMPECTOMY Right 06/08/2016   Procedure: BREAST LUMPECTOMY WITH ULTRASOUND IN O.R;  Surgeon: Kari Lye, MD;  Location: [facility] ORS;  Service: General;  Laterality: Right;  . BREAST SURGERY Left 02-26-13   subareloar duct excision and mass  . COLONOSCOPY  2012  . EXCISION / BIOPSY BREAST / NIPPLE / DUCT Right 1985   negative  . EXCISION / BIOPSY BREAST / NIPPLE / DUCT Left 02/26/2013   intraductal papilloma  . EXCISION / BIOPSY BREAST / NIPPLE / DUCT Right 06/09/2016  . TONSILLECTOMY      Family History  Problem Relation Age of Onset  . Hypertension Mother   . Arthritis Mother   . Parkinson's disease Father   .  Hypertension Sister   . Heart disease Daughter   . Cancer Sister 11       ovarian ca  . Cancer Brother        multiple myeloma  . Diabetes Brother   . Cancer Maternal Grandmother        colon ca  . Breast cancer Paternal Grandmother 48  . Bladder Cancer Paternal Uncle   . Prostate cancer Neg Hx   . Kidney cancer Neg Hx     Social History Social History   Tobacco Use  . Smoking status: Never Smoker  . Smokeless tobacco: Never Used  . Tobacco comment: smoked for 93month when 67yrold  Substance Use Topics  . Alcohol use: No    Alcohol/week: 0.0 oz  . Drug use: No    Allergies  Allergen Reactions  . Tetanus Toxoids   . Aloe Vera Rash    Current Outpatient Medications  Medication Sig Dispense Refill  . Ascorbic Acid (VITAMIN C) 100 MG tablet Take 100 mg by mouth daily.    . blood glucose meter kit and supplies KIT Use once daily to check fasting and post prandial blood sugars 1 each 0  . cetirizine (ZYRTEC) 10 MG tablet Take 10 mg by mouth daily.    . Chromium 200 MCG CAPS Take  1 capsule by mouth daily.    Marland Kitchen CINNAMON PO Take 2 capsules by mouth daily.    . cyanocobalamin (,VITAMIN B-12,) 1000 MCG/ML injection 1000 mcg (1 mL) Im injection weekly x 3,  Then monthly thereafter 10 mL 0  . dicyclomine (BENTYL) 10 MG capsule Take 1 capsule (10 mg total) by mouth 3 (three) times daily before meals. 90 capsule 0  . famotidine (PEPCID) 20 MG tablet Take 1 tablet (20 mg total) by mouth 2 (two) times daily. 60 tablet 1  . fluticasone (FLONASE) 50 MCG/ACT nasal spray Place into both nostrils daily.    Marland Kitchen glucose blood test strip Use as instructed 100 each 2  . glucose blood test strip Use as instructed 100 each 12  . losartan (COZAAR) 50 MG tablet Take 1 tablet (50 mg total) by mouth daily. 90 tablet 0  . ONETOUCH DELICA LANCETS 77A MISC Use once daily to check fasting or post prandial blood sugars 100 each 2  . ONETOUCH DELICA LANCETS FINE MISC Use as directed 100 each 2  .  PANTOTHENIC ACID PO Take 1 capsule by mouth daily.    . Syringe/Needle, Disp, (SYRINGE 3CC/25GX1") 25G X 1" 3 ML MISC Use for b12 injections 50 each 0   No current facility-administered medications for this visit.     Review of Systems Review of Systems  Constitutional: Negative.   Respiratory: Negative.   Cardiovascular: Negative.     Blood pressure (!) 142/80, pulse 69, resp. rate 13, height '5\' 5"'  (1.651 m), weight 181 lb (82.1 kg).  Physical Exam Physical Exam  Constitutional: She is oriented to person, place, and time. She appears well-developed and well-nourished.  HENT:  Mouth/Throat: Oropharynx is clear and moist.  Eyes: Conjunctivae are normal. No scleral icterus.  Neck: Neck supple.  Cardiovascular: Normal rate, regular rhythm and normal heart sounds.  Pulmonary/Chest: Effort normal and breath sounds normal. Right breast exhibits no inverted nipple, no mass, no nipple discharge, no skin change and no tenderness. Left breast exhibits no inverted nipple, no mass, no nipple discharge, no skin change and no tenderness.  Left breast > right breast 1 cup size.  Lymphadenopathy:    She has no cervical adenopathy.    She has no axillary adenopathy.  Neurological: She is alert and oriented to person, place, and time.  Skin: Skin is warm and dry.  Psychiatric: Her behavior is normal.    Data Reviewed Mar 13, 2018 bilateral screening mammograms were reviewed.  BI-RADS-1.  Assessment    Unremarkable breast exam.    Plan    Patient will be asked to return to the office in one year with a bilateral screening mammogram.  I have completed the exam and reviewed the above documentation for accuracy and completeness.  I agree with the above.  Haematologist has been used and any errors in dictation or transcription are unintentional.  Kari Lyons, M.D., F.A.C.S.     HPI, Physical Exam, Assessment and Plan have been scribed under the direction and in the presence of  Kari Bellow, MD. Kari Fetch, RN   Kari Lyons Kari Lyons 03/28/2018, 6:36 PM

## 2018-04-12 ENCOUNTER — Other Ambulatory Visit: Payer: Self-pay | Admitting: Internal Medicine

## 2018-04-13 ENCOUNTER — Telehealth: Payer: Self-pay | Admitting: Internal Medicine

## 2018-04-13 ENCOUNTER — Ambulatory Visit (INDEPENDENT_AMBULATORY_CARE_PROVIDER_SITE_OTHER): Payer: Medicare Other

## 2018-04-13 VITALS — BP 148/80 | HR 60 | Temp 98.1°F | Resp 14 | Ht 65.0 in | Wt 182.8 lb

## 2018-04-13 DIAGNOSIS — Z0000 Encounter for general adult medical examination without abnormal findings: Secondary | ICD-10-CM

## 2018-04-13 MED ORDER — LOSARTAN POTASSIUM 50 MG PO TABS: 50.0000 mg | ORAL_TABLET | Freq: Every day | ORAL | 1 refills | Status: DC

## 2018-04-13 NOTE — Progress Notes (Addendum)
Subjective:   Kari Lyons is a 67 y.o. female who presents for Medicare Annual (Subsequent) preventive examination.  Review of Systems: No ROS.  Medicare Wellness Visit. Additional risk factors are reflected in the social history. Cardiac Risk Factors include: advanced age (>82mn, >>53women);hypertension;diabetes mellitus;obesity (BMI >30kg/m2)     Objective:     Vitals: BP (!) 148/80 (BP Location: Left Arm, Patient Position: Sitting, Cuff Size: Normal)   Pulse 60   Temp 98.1 F (36.7 C) (Oral)   Resp 14   Ht _0  (1.651 m)   Wt 182 lb 12.8 oz (82.9 kg)   SpO2 97%   BMI 30.42 kg/m   Body mass index is 30.42 kg/m.  Advanced Directives 04/13/2018 06/12/2017  Does Patient Have a Medical Advance Directive? Yes Yes  Type of [place]amed[place]of [place] will  Does patient want to make changes to medical advance directive? No - Patient declined No - Patient declined  Copy of [place] Chart? Yes Yes    Tobacco Social History   Tobacco Use  Smoking Status Never Smoker  Smokeless Tobacco Never Used  Tobacco Comment   smoked for 329monthwhen [age]yrld     Counseling given: Not Answered Comment: smoked for 55mo57monthen 46yr64yr   Clinical Intake:  Pre-visit preparation completed: Yes  Pain : No/denies pain     Nutritional Status: BMI > 30  Obese Diabetes: Yes(Followed by pcp)  How often do you need to have someone help you when you read instructions, pamphlets, or other written materials from your doctor or pharmacy?: 1 - Never  Interpreter Needed?: No     Past Medical History:  Diagnosis Date  . Anemia    H/O   . Arrhythmia 2008  . Arthritis   . 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 2009: Reported workup at [place] including ECHO, Stress Test and Cardiac Cath; records N/A.  ECHO with mild LVH, mild MVP; stress test and cath reportedly normal   .  Depression   . Edema   . GERD (gastroesophageal reflux disease)   . Hypertension   . IBS (irritable bowel syndrome)   . Mitral valve prolapse   . Patient is Jehovah's Witness   . Sleep apnea    MILD-NO CPAP    Past Surgical History:  Procedure Laterality Date  . ABDOMINAL HYSTERECTOMY  1985  . BILATERAL SALPINGOOPHORECTOMY  2002   due to fh of ovarian ca  . BREAST BIOPSY Right 03/09/2016   papilloma  . BREAST LUMPECTOMY Right 06/08/2016   Procedure: BREAST LUMPECTOMY WITH ULTRASOUND IN O.R;  Surgeon: SeeplChristene Lye  Location: [facility] ORS;  Service: General;  Laterality: Right;  . BREAST SURGERY Left 02-26-13   subareloar duct excision and mass  . COLONOSCOPY  2012  . EXCISION / BIOPSY BREAST / NIPPLE / DUCT Right 1985   negative  . EXCISION / BIOPSY BREAST / NIPPLE / DUCT Left 02/26/2013   intraductal papilloma  . EXCISION / BIOPSY BREAST / NIPPLE / DUCT Right 06/09/2016  . TONSILLECTOMY     Family History  Problem Relation Age of Onset  . Hypertension Mother   . Arthritis Mother   . Parkinson's disease Father   . Hypertension Sister   . Heart disease Daughter   . Cancer Sister 48   68  ovarian ca  . Cancer Brother  multiple myeloma  . Diabetes Brother   . Cancer Maternal Grandmother        colon ca  . Breast cancer Paternal Grandmother 39  . Bladder Cancer Paternal Uncle   . Prostate cancer Neg Hx   . Kidney cancer Neg Hx    Social History   Socioeconomic History  . Marital status: Married    Spouse name: Not on file  . Number of children: Not on file  . Years of education: Not on file  . Highest education level: Not on file  Occupational History  . Not on file  Social Needs  . Financial resource strain: Not hard at all  . Food insecurity:    Worry: Never true    Inability: Never true  . Transportation needs:    Medical: No    Non-medical: Not on file  Tobacco Use  . Smoking status: Never Smoker  . Smokeless tobacco: Never Used  .  Tobacco comment: smoked for 42month when 67yrold  Substance and Sexual Activity  . Alcohol use: No    Alcohol/week: 0.0 oz  . Drug use: No  . Sexual activity: Not on file  Lifestyle  . Physical activity:    Days per week: Not on file    Minutes per session: Not on file  . Stress: Not at all  Relationships  . Social connections:    Talks on phone: Not on file    Gets together: Not on file    Attends religious service: Not on file    Active member of club or organization: Not on file    Attends meetings of clubs or organizations: Not on file    Relationship status: Not on file  Other Topics Concern  . Not on file  Social History Narrative  . Not on file    Outpatient Encounter Medications as of 04/13/2018  Medication Sig  . Ascorbic Acid (VITAMIN C) 100 MG tablet Take 100 mg by mouth daily.  . blood glucose meter kit and supplies KIT Use once daily to check fasting and post prandial blood sugars  . cetirizine (ZYRTEC) 10 MG tablet Take 10 mg by mouth daily.  . Chromium 200 MCG CAPS Take 1 capsule by mouth daily.  . Marland KitchenINNAMON PO Take 2 capsules by mouth daily.  . cyanocobalamin (,VITAMIN B-12,) 1000 MCG/ML injection 1000 mcg (1 mL) Im injection weekly x 3,  Then monthly thereafter  . dicyclomine (BENTYL) 10 MG capsule Take 1 capsule (10 mg total) by mouth 3 (three) times daily before meals.  . famotidine (PEPCID) 20 MG tablet Take 1 tablet (20 mg total) by mouth 2 (two) times daily.  . fluticasone (FLONASE) 50 MCG/ACT nasal spray Place into both nostrils daily.  . Marland Kitchenlucose blood test strip Use as instructed  . glucose blood test strip Use as instructed  . losartan (COZAAR) 50 MG tablet TAKE 1 TABLET BY MOUTH ONCE DAILY  . ONETOUCH DELICA LANCETS 3335KISC Use once daily to check fasting or post prandial blood sugars  . ONETOUCH DELICA LANCETS FINE MISC Use as directed  . PANTOTHENIC ACID PO Take 1 capsule by mouth daily.  . Syringe/Needle, Disp, (SYRINGE 3CC/25GX1") 25G X 1" 3 ML  MISC Use for b12 injections   No facility-administered encounter medications on file as of 04/13/2018.     Activities of Daily Living In your present state of health, do you have any difficulty performing the following activities: 04/13/2018 06/12/2017  Hearing? N N  Vision? N N  Difficulty concentrating or making decisions? N N  Walking or climbing stairs? Y Y  Comment - COPD.  SOB on exertion  Dressing or bathing? N N  Doing errands, shopping? N N  Preparing Food and eating ? N N  Using the Toilet? N N  In the past six months, have you accidently leaked urine? N N  Do you have problems with loss of bowel control? N N  Managing your Medications? N N  Managing your Finances? N N  Housekeeping or managing your Housekeeping? N N  Some recent data might be hidden    Patient Care Team: [name], MD as PCP - General (Internal Medicine) Christene Lye, MD (General Surgery) Albina Billet, MD (Internal Medicine)    Assessment:   This is a routine wellness examination for Kari Lyons.  The goal of the wellness visit is to assist the patient how to close the gaps in care and create a preventative care plan for the patient.   The roster of all physicians providing medical care to patient is listed in the Snapshot section of the chart.  Osteoporosis risk reviewed.    Safety issues reviewed; Smoke and carbon monoxide detectors in the home. No firearms in the home. Wears seatbelts when driving or riding with others. No violence in the home.  They do not have excessive sun exposure.  Discussed the need for sun protection: hats, long sleeves and the use of sunscreen if there is significant sun exposure.  Patient is alert, normal appearance, oriented to person/place/and time. Correctly identified the president of the [place] and recalls of 3/3 words.Performs simple calculations and can read correct time from watch face. Displays appropriate judgement.  No new identified risk were  noted.  No failures at ADL's or IADL's.   BMI- discussed the importance of a healthy diet, water intake and the benefits of aerobic exercise. Educational material provided.   24 hour diet recall: Low carb diet  Dental- every 6 months.  Eye- Visual acuity not assessed per patient preference since they have regular follow up with the ophthalmologist.  Wears corrective lenses.  Sleep patterns- Sleeps 6 hours at night.  Wakes feeling rested.   TDAP vaccine deferred per patient preference.    Health maintenance gaps- closed.  Patient Concerns: None at this time. Follow up with PCP as needed.  Exercise Activities and Dietary recommendations Current Exercise Habits: Structured exercise class, Type of exercise: strength training/weights;calisthenics;walking, Time (Minutes): 60, Frequency (Times/Week): 3, Weekly Exercise (Minutes/Week): 180, Intensity: Mild  Goals    . DIET - REDUCE PORTION SIZE    . Weight (lb) < 170 lb (77.1 kg)     Lose 20lbs Core exercises as demonstrated.  Additional educational material provided.        Fall Risk Fall Risk  04/13/2018 06/12/2017 05/19/2017 03/11/2016  Falls in the past year? No Yes Yes No  Number falls in past yr: - 1 1 -  Injury with Fall? - Yes Yes -  Comment - Hurt R knee but she did not seek medical attention. - -  Follow up - Falls prevention discussed;Education provided - -   Depression Screen PHQ 2/9 Scores 04/13/2018 05/19/2017 03/11/2016  PHQ - 2 Score 0 6 0  PHQ- 9 Score - 12 -     Cognitive Function MMSE - Mini Mental State Exam 04/13/2018 06/12/2017  Orientation to time 5 5  Orientation to Place 5 5  Registration 3 3  Attention/ Calculation 5 5  Recall  3 3  Language- name 2 objects 2 2  Language- repeat 1 1  Language- follow 3 step command 3 3  Language- read & follow direction 1 1  Write a sentence 1 1  Copy design 1 1  Total score 30 30        Immunization History  Administered Date(s) Administered  .  Influenza,inj,Quad PF,6+ Mos 09/15/2017   Screening Tests Health Maintenance  Topic Date Due  . TETANUS/TDAP  04/14/2019 (Originally 05/11/1970)  . INFLUENZA VACCINE  05/17/2018  . FOOT EXAM  06/12/2018  . HEMOGLOBIN A1C  06/28/2018  . OPHTHALMOLOGY EXAM  02/17/2019  . MAMMOGRAM  03/05/2020  . COLONOSCOPY  09/27/2021  . DEXA SCAN  Completed  . Hepatitis C Screening  Completed  . PNA vac Low Risk Adult  Discontinued      Plan:    End of life planning; Advance aging; Advanced directives discussed. Copy of current HCPOA/Living Will on file.    I have personally reviewed and noted the following in the patient's chart:   . Medical and social history . Use of alcohol, tobacco or illicit drugs  . Current medications and supplements . Functional ability and status . Nutritional status . Physical activity . Advanced directives . List of other physicians . Hospitalizations, surgeries, and ER visits in previous 12 months . Vitals . Screenings to include cognitive, depression, and falls . Referrals and appointments  In addition, I have reviewed and discussed with patient certain preventive protocols, quality metrics, and best practice recommendations. A written personalized care plan for preventive services as well as general preventive health recommendations were provided to patient.     OBrien-Blaney, Denisa L, LPN  9/59/[id number]   I have reviewed the above information and agree with above.   Deborra Medina, MD

## 2018-04-13 NOTE — Telephone Encounter (Signed)
Please advise 

## 2018-04-13 NOTE — Telephone Encounter (Signed)
Pt needs a refill for medication of losartan (COZAAR) 50 MG tablet pt has 1 week of pills left. No more refills left.  Pharmacy is [place], [place] - [address]  Please and Thank you!  Call pt @ [phone].

## 2018-04-13 NOTE — Patient Instructions (Addendum)
  Kari Lyons , Thank you for taking time to come for your Medicare Wellness Visit. I appreciate your ongoing commitment to your health goals. Please review the following plan we discussed and let me know if I can assist you in the future.   Routine maintenance appointment with your doctor.   These are the goals we discussed: Goals    . DIET - REDUCE PORTION SIZE    . Weight (lb) < 170 lb (77.1 kg)     Lose 20lbs Core exercises as demonstrated.  Additional educational material provided.        This is a list of the screening recommended for you and due dates:  Health Maintenance  Topic Date Due  . Tetanus Vaccine  04/14/2019*  . Flu Shot  05/17/2018  . Complete foot exam   06/12/2018  . Hemoglobin A1C  06/28/2018  . Eye exam for diabetics  02/17/2019  . Mammogram  03/05/2020  . Colon Cancer Screening  09/27/2021  . DEXA scan (bone density measurement)  Completed  .  Hepatitis C: One time screening is recommended by Center for Disease Control  (CDC) for  adults born from [birth year] through 1966.   Completed  . Pneumonia vaccines  Discontinued  *Topic was postponed. The date shown is not the original due date.

## 2018-04-13 NOTE — Telephone Encounter (Signed)
Medication has been refilled. Attempted to call pt to let her know. No answer no voicemail.

## 2018-05-29 IMAGING — MR MR HEAD WO/W CM
2 series · 48 of 48 positions shown · IV contrast (16 ML MULTIHANCE)
Comparison: None.

CLINICAL DATA: Numbness and tingling in the cheeks and nose.
Headache and dizziness. Balance issues.

EXAM:
MRI HEAD AND FACE WITHOUT AND WITH CONTRAST
TECHNIQUE: Multiplanar, multiecho pulse sequences of the brain and surrounding
structures were obtained without and with intravenous contrast.
Multiplanar, multiecho pulse sequences of the orbits and surrounding
structures were obtained including fat saturation techniques, before
and after intravenous contrast administration.
CONTRAST:  16mL MULTIHANCE GADOBENATE DIMEGLUMINE 529 MG/ML IV SOLN

[Series 13: T1 post-contrast · axial · 1.0mm · 1.00mm/px · z∈[-3,+156]mm · 40 of 160 slices shown (1 of 2)]
[im 1/160]
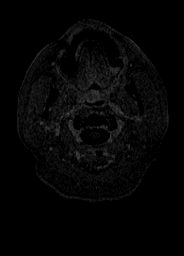
[im 5/160]
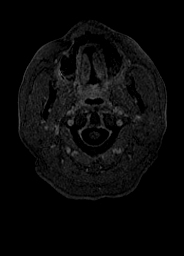
[im 9/160]
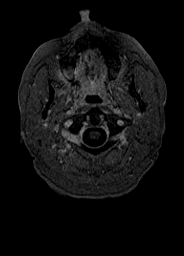
[im 13/160]
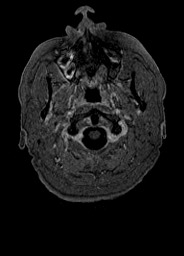
[im 17/160]
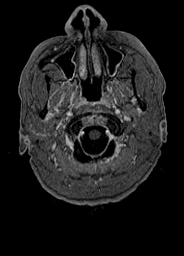
[im 21/160]
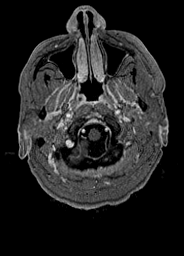
[im 25/160]
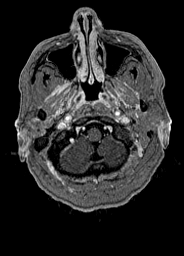
[im 29/160]
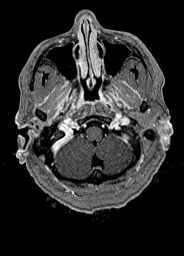
[im 33/160]
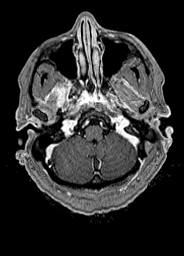
[im 37/160]
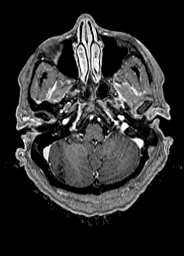
[im 41/160]
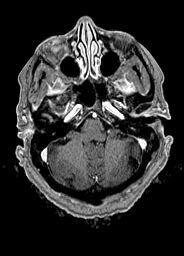
[im 45/160]
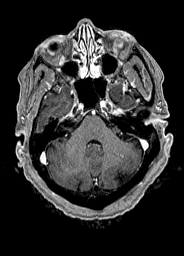
[im 49/160]
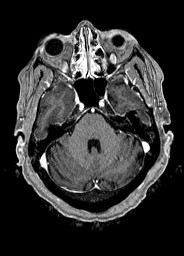
[im 54/160]
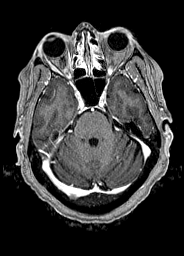
[im 58/160]
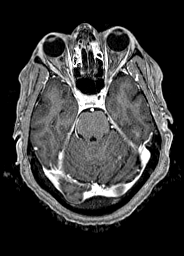
[im 62/160]
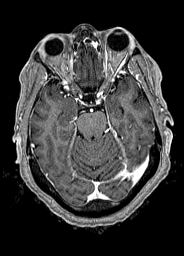
[im 66/160]
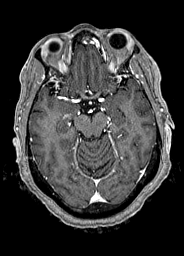
[im 70/160]
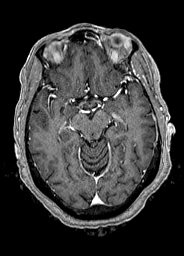
[im 74/160]
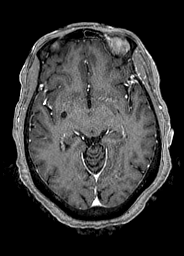
[im 78/160]
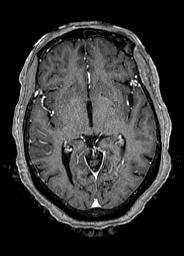
[im 82/160]
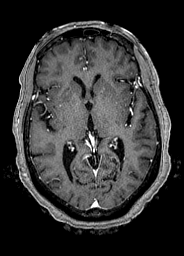
[im 86/160]
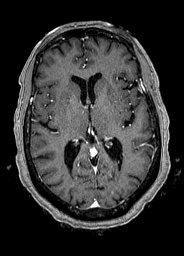
[im 90/160]
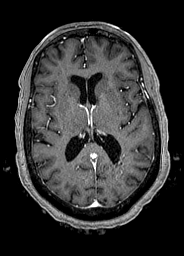
[im 94/160]
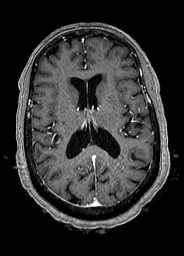
[im 98/160]
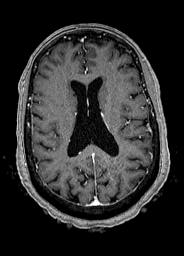
[im 102/160]
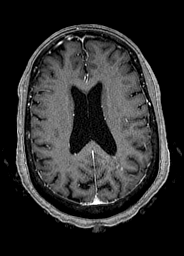
[im 107/160]
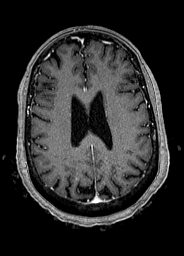
[im 111/160]
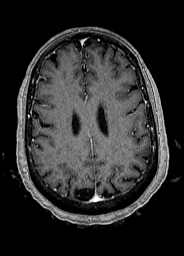
[im 115/160]
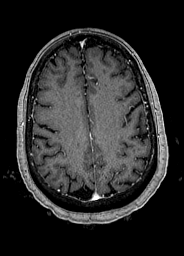
[im 119/160]
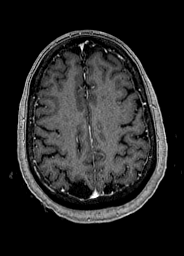
[im 123/160]
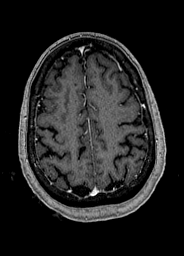
[im 127/160]
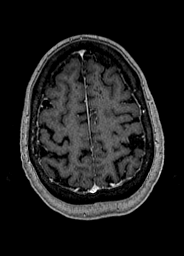
[im 131/160]
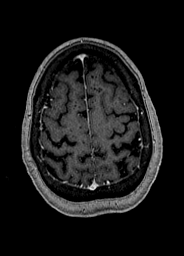
[im 135/160]
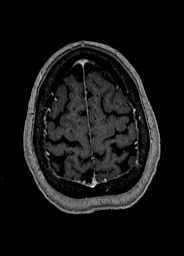
[im 139/160]
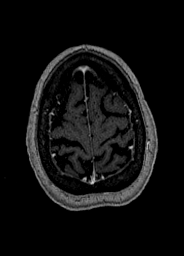
[im 143/160]
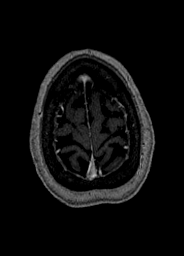
[im 147/160]
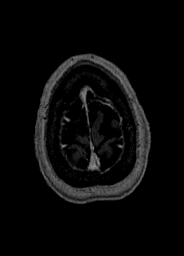
[im 151/160]
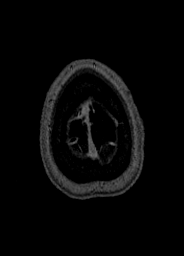
[im 155/160]
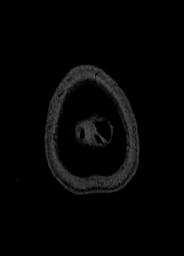
[im 160/160]
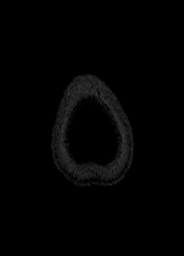

[Series 14: T1 post-contrast · coronal · 5.0mm · 0.45mm/px · 8 of 31 slices shown (2 of 2)]
[im 1/31]
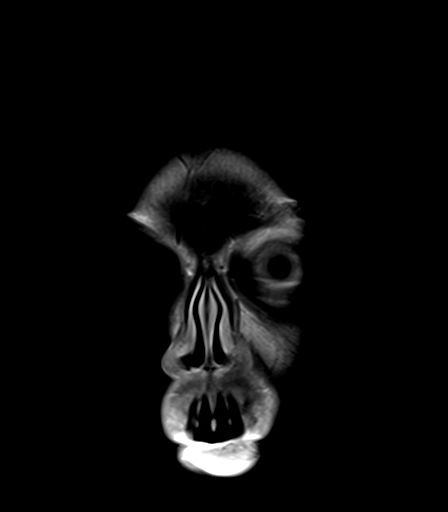
[im 5/31]
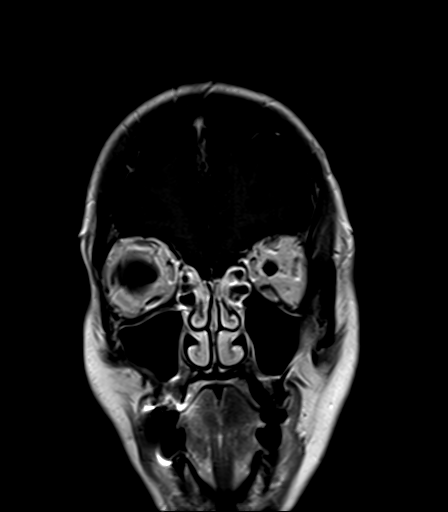
[im 9/31]
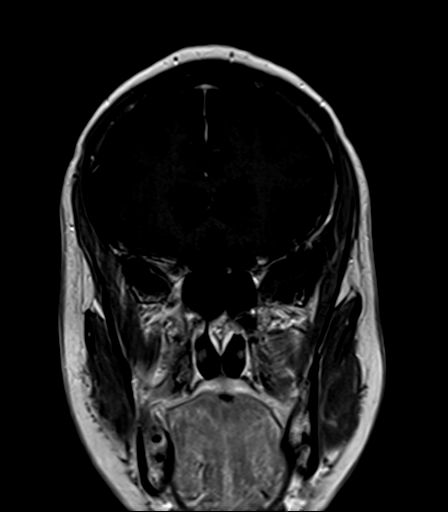
[im 13/31]
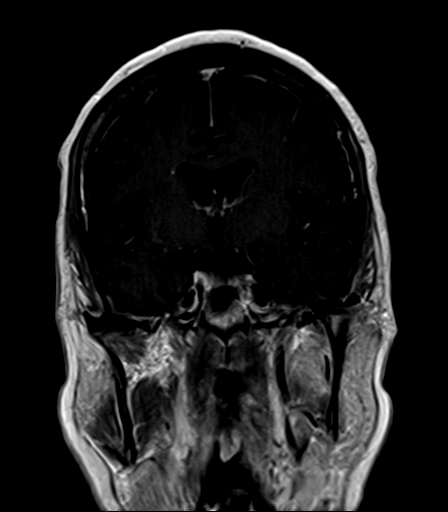
[im 18/31]
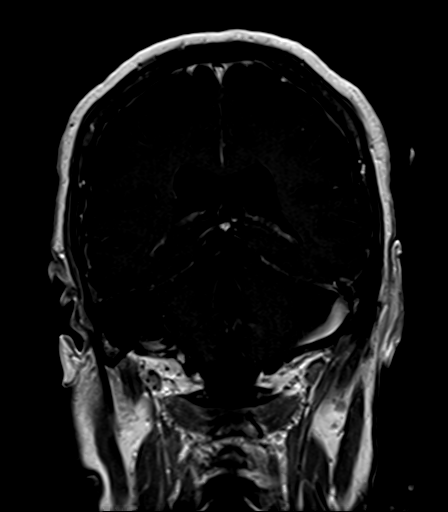
[im 22/31]
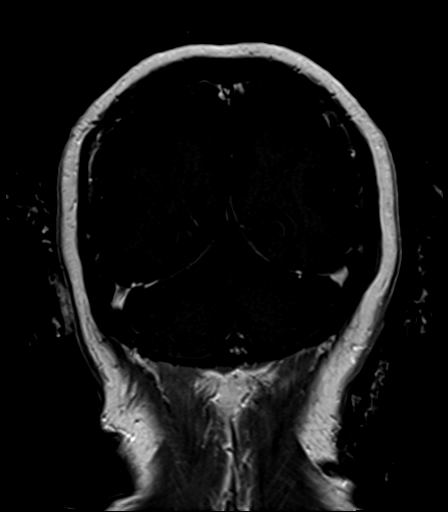
[im 26/31]
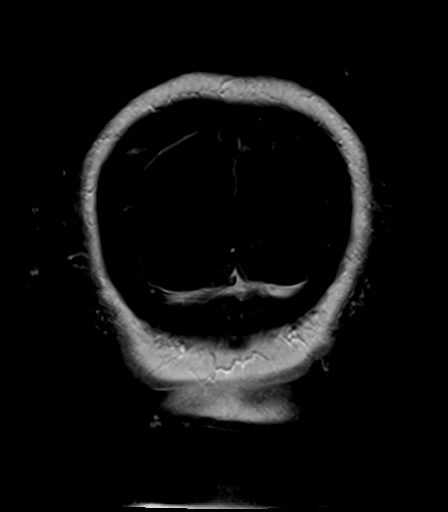
[im 31/31]
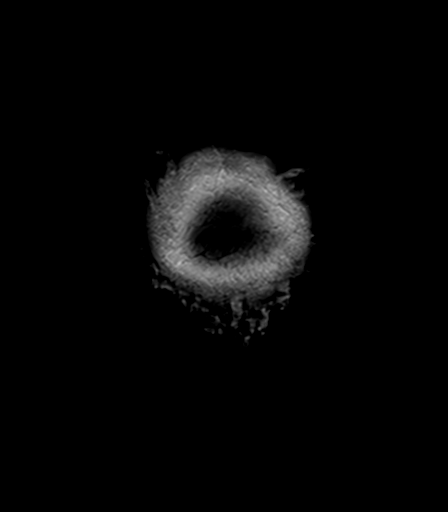

[48 of 48 positions shown; findings below may reference images not displayed]

FINDINGS: MRI HEAD FINDINGS

Brain: No specific explanation for symptoms. No findings in the
brainstem, cisterns, or labyrinth to explain dizziness. No mass,
blood products, hydrocephalus, or collection. There is mild for age
generalized volume loss and white matter FLAIR hyperintensity.

Vascular: Major flow voids and vascular enhancements are preserved.

Skull and upper cervical spine: No evidence for marrow lesion.

MRI FACE FINDINGS

Sequences were optimized for the trigeminal nerves.

Orbits: Normal appearance of the globes, optic nerve sheath
complexes, extraocular muscles, superior ophthalmic veins, lacrimal
apparatus, and orbital fat.

Sinuses: Moderate mucosal thickening in the bilateral ethmoid
sinuses. Mild left inferior frontal and bilateral maxillary mucosal
thickening. No fluid levels.

Soft tissues: No evidence of mass or inflammation. Normal bulk of
the muscles of mastication. No adenopathy.

Limited intracranial: Normal appearance of the brainstem and
cisternal trigeminal nerves. No unexpected vessel along the root
entry zones.

Osseous: No evidence of marrow lesion. C4-5, C5-6, and C6-7 disc
degeneration with narrowing and endplate spurring.
IMPRESSION: Brain MRI:

Negative.  No explanation for symptoms.

Face MRI:

1. No abnormality noted along the trigeminal nerves.
2. Overall mild sinusitis that is greatest at the ethmoids.

## 2018-06-13 ENCOUNTER — Ambulatory Visit: Payer: Medicare Other

## 2018-07-06 ENCOUNTER — Encounter: Payer: Self-pay | Admitting: Internal Medicine

## 2018-07-06 ENCOUNTER — Ambulatory Visit: Payer: Medicare Other | Admitting: Internal Medicine

## 2018-07-06 VITALS — BP 142/74 | HR 63 | Temp 98.3°F | Resp 14 | Ht 65.0 in | Wt 182.2 lb

## 2018-07-06 DIAGNOSIS — Z23 Encounter for immunization: Secondary | ICD-10-CM | POA: Diagnosis not present

## 2018-07-06 DIAGNOSIS — I10 Essential (primary) hypertension: Secondary | ICD-10-CM

## 2018-07-06 DIAGNOSIS — E6609 Other obesity due to excess calories: Secondary | ICD-10-CM | POA: Diagnosis not present

## 2018-07-06 DIAGNOSIS — H9042 Sensorineural hearing loss, unilateral, left ear, with unrestricted hearing on the contralateral side: Secondary | ICD-10-CM

## 2018-07-06 DIAGNOSIS — E538 Deficiency of other specified B group vitamins: Secondary | ICD-10-CM | POA: Diagnosis not present

## 2018-07-06 DIAGNOSIS — H6982 Other specified disorders of Eustachian tube, left ear: Secondary | ICD-10-CM

## 2018-07-06 DIAGNOSIS — Z6832 Body mass index (BMI) 32.0-32.9, adult: Secondary | ICD-10-CM

## 2018-07-06 DIAGNOSIS — E119 Type 2 diabetes mellitus without complications: Secondary | ICD-10-CM | POA: Diagnosis not present

## 2018-07-06 LAB — COMPREHENSIVE METABOLIC PANEL
ALT: 25 U/L (ref 0–35)
AST: 26 U/L (ref 0–37)
Albumin: 4.1 g/dL (ref 3.5–5.2)
Alkaline Phosphatase: 54 U/L (ref 39–117)
BUN: 12 mg/dL (ref 6–23)
CO2: 32 mEq/L (ref 19–32)
Calcium: 9.4 mg/dL (ref 8.4–10.5)
Chloride: 104 mEq/L (ref 96–112)
Creatinine, Ser: 0.81 mg/dL (ref 0.40–1.20)
GFR: 90.39 mL/min (ref >60.00)
Glucose, Bld: 96 mg/dL (ref 70–99)
Potassium: 4.2 mEq/L (ref 3.5–5.1)
Sodium: 141 mEq/L (ref 135–145)
Total Bilirubin: 0.7 mg/dL (ref 0.2–1.2)
Total Protein: 7 g/dL (ref 6.0–8.3)

## 2018-07-06 LAB — HEMOGLOBIN A1C: Hgb A1c MFr Bld: 6.2 % (ref 4.6–6.5)

## 2018-07-06 LAB — VITAMIN B12: Vitamin B-12: >1500 pg/mL — ABNORMAL HIGH (ref 211–911)

## 2018-07-06 LAB — LIPID PANEL
Cholesterol: 137 mg/dL (ref 0–200)
HDL: 51.1 mg/dL (ref >39.00)
LDL Cholesterol: 73 mg/dL (ref 0–99)
NonHDL: 85.98
Total CHOL/HDL Ratio: 3
Triglycerides: 67 mg/dL (ref 0.0–149.0)
VLDL: 13.4 mg/dL (ref 0.0–40.0)

## 2018-07-06 MED ORDER — ZOSTER VAC RECOMB ADJUVANTED 50 MCG/0.5ML IM SUSR: 0.5000 mL | Freq: Once | INTRAMUSCULAR | 1 refills | Status: AC

## 2018-07-06 NOTE — Progress Notes (Signed)
Subjective:  Patient ID: Kari Lyons, female    DOB: 1950/12/13  Age: 67 y.o. MRN: [id number]  CC: The primary encounter diagnosis was B12 deficiency due to diet. Diagnoses of Type 2 diabetes mellitus without complication, without long-term current use of insulin ([place]), Need for influenza vaccination, Class 1 obesity due to excess calories with serious comorbidity and body mass index (BMI) of 32.0 to 32.9 in adult, Essential hypertension, Eustachian tube dysfunction, left, and Sensorineural hearing loss (SNHL) of left ear with unrestricted hearing of right ear were also pertinent to this visit.  HPI Kari Lyons presents for 6 month follow up on diabetes.  Patient is following a low glycemic index diet and taking all prescribed medications regularly without side effects.  Fasting sugars have been under less than 110 most of the time and post prandials have been under 160 except on rare occasions. Patient is exercising about 3 times per week and intentionally trying to lose weight .  Patient has had an eye exam in May 2019 b [place] and checks feet regularly for signs of infection.  Patient does not walk barefoot outside,  And denies an numbness tingling or burning in feet. Patient is up to date on all recommended vaccinations  b12 deficiency . INTRINSIC FACTOR NEGATIVE .  TAKING B12 SUPPLEMENTS 1000 MCG DAILY.    MRI  Of brain and sinuses ordered by ENTdurimg recent evaluation .  Sinus thickening noted   Breast exam done by [facility] may   PARTIAL HEARING LOSS LEFT EAR, acute onset in July after flying to [place].  .  Accompanied by tinnitus.  Feels it was avoidable because she had sinus thickening on MRII and was not told to avoid flying.  ENT records suggest th hearing loss occurred as a result of viral neuronitis. 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  Being treated for allergic rhinitis and Eustachian tube dysfunction  SEEING JUENGEL   Now.  Allergy testing next week .     Outpatient Medications Prior  to Visit  Medication Sig Dispense Refill  . Ascorbic Acid (VITAMIN C) 100 MG tablet Take 100 mg by mouth daily.    . blood glucose meter kit and supplies KIT Use once daily to check fasting and post prandial blood sugars 1 each 0  . Chromium 200 MCG CAPS Take 1 capsule by mouth daily.    Marland Kitchen CINNAMON PO Take 2 capsules by mouth daily.    Marland Kitchen glucose blood test strip Use as instructed 100 each 2  . glucose blood test strip Use as instructed 100 each 12  . losartan (COZAAR) 50 MG tablet Take 1 tablet (50 mg total) by mouth daily. 90 tablet 1  . ONETOUCH DELICA LANCETS 46T MISC Use once daily to check fasting or post prandial blood sugars 100 each 2  . ONETOUCH DELICA LANCETS FINE MISC Use as directed 100 each 2  . PANTOTHENIC ACID PO Take 1 capsule by mouth daily.    . cetirizine (ZYRTEC) 10 MG tablet Take 10 mg by mouth daily.    Marland Kitchen dicyclomine (BENTYL) 10 MG capsule Take 1 capsule (10 mg total) by mouth 3 (three) times daily before meals. (Patient not taking: Reported on 07/06/2018) 90 capsule 0  . famotidine (PEPCID) 20 MG tablet Take 1 tablet (20 mg total) by mouth 2 (two) times daily. (Patient not taking: Reported on 07/06/2018) 60 tablet 1  . fluticasone (FLONASE) 50 MCG/ACT nasal spray Place into both nostrils daily.    . cyanocobalamin (,VITAMIN  B-12,) 1000 MCG/ML injection 1000 mcg (1 mL) Im injection weekly x 3,  Then monthly thereafter (Patient not taking: Reported on 07/06/2018) 10 mL 0  . Syringe/Needle, Disp, (SYRINGE 3CC/25GX1") 25G X 1" 3 ML MISC Use for b12 injections (Patient not taking: Reported on 07/06/2018) 50 each 0   No facility-administered medications prior to visit.     Review of Systems;  Patient denies headache, fevers, malaise, unintentional weight loss, skin rash, eye pain, sinus congestion and sinus pain, sore throat, dysphagia,  hemoptysis , cough, dyspnea, wheezing, chest pain, palpitations, orthopnea, edema, abdominal pain, nausea, melena, diarrhea, constipation, flank  pain, dysuria, hematuria, urinary  Frequency, nocturia, numbness, tingling, seizures,  Focal weakness, Loss of consciousness,  Tremor, insomnia, depression, anxiety, and suicidal ideation.      Objective:  BP (!) 142/74 (BP Location: Left Arm, Patient Position: Sitting, Cuff Size: Normal)   Pulse 63   Temp 98.3 F (36.8 C) (Oral)   Resp 14   Ht 5' 5" (1.651 m)   Wt 182 lb 3.2 oz (82.6 kg)   SpO2 97%   BMI 30.32 kg/m   BP Readings from Last 3 Encounters:  07/06/18 (!) 142/74  04/13/18 (!) 148/80  03/27/18 (!) 142/80    Wt Readings from Last 3 Encounters:  07/06/18 182 lb 3.2 oz (82.6 kg)  04/13/18 182 lb 12.8 oz (82.9 kg)  03/27/18 181 lb (82.1 kg)    General appearance: alert, cooperative and appears stated age Ears: normal TM's and external ear canals both ears Throat: lips, mucosa, and tongue normal; teeth and gums normal Neck: no adenopathy, no carotid bruit, supple, symmetrical, trachea midline and thyroid not enlarged, symmetric, no tenderness/mass/nodules Back: symmetric, no curvature. ROM normal. No CVA tenderness. Lungs: clear to auscultation bilaterally Heart: regular rate and rhythm, S1, S2 normal, no murmur, click, rub or gallop Abdomen: soft, non-tender; bowel sounds normal; no masses,  no organomegaly Pulses: 2+ and symmetric Skin: Skin color, texture, turgor normal. No rashes or lesions Lymph nodes: Cervical, supraclavicular, and axillary nodes normal.  Lab Results  Component Value Date   HGBA1C 6.2 07/06/2018   HGBA1C 6.0 12/26/2017   HGBA1C 6.0 09/15/2017    Lab Results  Component Value Date   CREATININE 0.81 07/06/2018   CREATININE 0.82 12/26/2017   CREATININE 0.77 09/15/2017    Lab Results  Component Value Date   WBC 5.2 05/19/2017   HGB 12.5 05/19/2017   HCT 39.1 05/19/2017   PLT 223.0 05/19/2017   GLUCOSE 96 07/06/2018   CHOL 137 07/06/2018   TRIG 67.0 07/06/2018   HDL 51.10 07/06/2018   LDLCALC 73 07/06/2018   ALT 25 07/06/2018    AST 26 07/06/2018   NA 141 07/06/2018   K 4.2 07/06/2018   CL 104 07/06/2018   CREATININE 0.81 07/06/2018   BUN 12 07/06/2018   CO2 32 07/06/2018   TSH 1.82 05/19/2017   HGBA1C 6.2 07/06/2018   MICROALBUR <0.7 12/29/2017    Mm Digital Screening  Result Date: 03/05/2018 CLINICAL DATA:  Screening. EXAM: DIGITAL SCREENING BILATERAL MAMMOGRAM WITH CAD COMPARISON:  Previous exam(s). ACR Breast Density Category b: There are scattered areas of fibroglandular density. FINDINGS: There are no findings suspicious for malignancy. Images were processed with CAD. IMPRESSION: No mammographic evidence of malignancy. A result letter of this screening mammogram will be mailed directly to the patient. RECOMMENDATION: Screening mammogram in one year. (Code:SM-B-01Y) BI-RADS CATEGORY  1: Negative. Electronically Signed   By: Claudie Revering M.D.   On:  03/05/2018 14:09    Assessment & Plan:   Problem List Items Addressed This Visit    B12 deficiency due to diet - Primary    Treated with parenteral supplementation until negative  intrinsic factor status was confirmed.  Now on oral supplements.   Lab Results  Component Value Date   VITAMINB12 >1500 (H) 07/06/2018         Relevant Orders   B12 (Completed)   Essential hypertension    Not at goal on current regimen. Renal function stable, no changes today until home readings can be assessed. .  Lab Results  Component Value Date   CREATININE 0.81 07/06/2018   Lab Results  Component Value Date   NA 141 07/06/2018   K 4.2 07/06/2018   CL 104 07/06/2018   CO2 32 07/06/2018         Eustachian tube dysfunction, left   Obesity, unspecified    She has maintained a 14 lb st loss over the last 12 months,  But her BMI is still 30.  I have addressed  BMI and recommended a low glycemic index diet utilizing smaller more frequent meals to increase metabolism.  I have also recommended that patient start exercising with a goal of 30 minutes of aerobic exercise a  minimum of 5 days per week.       Sensorineural hearing loss (SNHL) of left ear    Acute ,  Occurred in July after flying to [place].  Dr Kathyrn Sheriff confirmed with audiometry      Type 2 diabetes mellitus without complication, without long-term current use of insulin ([place])    Impressive  Management with diet alone .  Advised to start a baby aspirin daily if tolerated.   Advised to avoid fasting states to avoid hypoglycemia.  RTC 6 months   Lab Results  Component Value Date   HGBA1C 6.2 07/06/2018   Lab Results  Component Value Date   MICROALBUR <0.7 12/29/2017         Relevant Orders   Comprehensive metabolic panel (Completed)   Hemoglobin A1c (Completed)   Lipid panel (Completed)   Comprehensive metabolic panel   Hemoglobin A1c   Lipid panel    Other Visit Diagnoses    Need for influenza vaccination       Relevant Orders   Flu Vaccine QUAD 6+ mos PF IM (Fluarix Quad PF) (Completed)    A total of 25 minutes of face to face time was spent with patient more than half of which was spent in counselling about the above mentioned conditions  and coordination of care   I have discontinued Vara M. Mcparland's cyanocobalamin and SYRINGE 3CC/25GX1". I am also having her start on Zoster Vaccine Adjuvanted. Additionally, I am having her maintain her vitamin C, cetirizine, fluticasone, dicyclomine, famotidine, blood glucose meter kit and supplies, Chromium, CINNAMON PO, PANTOTHENIC ACID PO, glucose blood, ONETOUCH DELICA LANCETS FINE, ONETOUCH DELICA LANCETS 06C, glucose blood, and losartan.  Meds ordered this encounter  Medications  . Zoster Vaccine Adjuvanted [facility]) injection    Sig: Inject 0.5 mLs into the muscle once for 1 dose.    Dispense:  1 each    Refill:  1    Medications Discontinued During This Encounter  Medication Reason  . cyanocobalamin (,VITAMIN B-12,) 1000 MCG/ML injection Completed Course  . Syringe/Needle, Disp, (SYRINGE 3CC/25GX1") 25G X 1" 3 ML MISC Completed  Course    Follow-up: Return for follow up diabetes.   [name], MD

## 2018-07-06 NOTE — Patient Instructions (Signed)
,   The new goals for optimal blood pressure management are 130/80 FOR PEOPLE YOUR AGE .  Please CONTINUE to  check your blood pressure  home and let me know if your readings are over 140/80   I'll see you in 6 months.  You can arrange to have labs prior to your visit

## 2018-07-08 DIAGNOSIS — H6982 Other specified disorders of Eustachian tube, left ear: Secondary | ICD-10-CM | POA: Insufficient documentation

## 2018-07-08 DIAGNOSIS — H905 Unspecified sensorineural hearing loss: Secondary | ICD-10-CM | POA: Insufficient documentation

## 2018-07-08 NOTE — Assessment & Plan Note (Addendum)
Not at goal on current regimen. Renal function stable, no changes today until home readings can be assessed. .  Lab Results  Component Value Date   CREATININE 0.81 07/06/2018   Lab Results  Component Value Date   NA 141 07/06/2018   K 4.2 07/06/2018   CL 104 07/06/2018   CO2 32 07/06/2018

## 2018-07-08 NOTE — Assessment & Plan Note (Signed)
Acute ,  Occurred in July after flying to [place].  Dr Kathyrn Sheriff confirmed with audiometry

## 2018-07-08 NOTE — Assessment & Plan Note (Signed)
Impressive  Management with diet alone .  Advised to start a baby aspirin daily if tolerated.   Advised to avoid fasting states to avoid hypoglycemia.  RTC 6 months   Lab Results  Component Value Date   HGBA1C 6.2 07/06/2018   Lab Results  Component Value Date   MICROALBUR <0.7 12/29/2017

## 2018-07-08 NOTE — Assessment & Plan Note (Signed)
She has maintained a 14 lb st loss over the last 12 months,  But her BMI is still 30.  I have addressed  BMI and recommended a low glycemic index diet utilizing smaller more frequent meals to increase metabolism.  I have also recommended that patient start exercising with a goal of 30 minutes of aerobic exercise a minimum of 5 days per week.

## 2018-07-08 NOTE — Assessment & Plan Note (Signed)
Treated with parenteral supplementation until negative  intrinsic factor status was confirmed.  Now on oral supplements.   Lab Results  Component Value Date   VITAMINB12 >1500 (H) 07/06/2018

## 2018-10-22 ENCOUNTER — Other Ambulatory Visit: Payer: Self-pay | Admitting: Internal Medicine

## 2018-10-22 NOTE — Telephone Encounter (Signed)
Copied from [place] [id number]. Topic: Quick Communication - Rx Refill/Question >> Oct 22, 2018  1:22 PM Waldemar Dickens, Sade R wrote: Medication:losartan (COZAAR) 50 MG tablet  Has the patient contacted their pharmacy?Yes [facility], [place] [phone] (Phone) [phone] (Fax)   Preferred Pharmacy (with phone number or street name):   Agent: Please be advised that RX refills may take up to 3 business days. We ask that you follow-up with your pharmacy.

## 2018-10-23 MED ORDER — LOSARTAN POTASSIUM 50 MG PO TABS: 50.0000 mg | ORAL_TABLET | Freq: Every day | ORAL | 1 refills | Status: DC

## 2018-10-23 NOTE — Telephone Encounter (Signed)
Requested medication (s) are due for refill today: yes  Requested medication (s) are on the active medication list: yes  Last refill:  04/13/18  Future visit scheduled: yes  Notes to clinic:  Angiotension receptor blockers failed.  Requested Prescriptions  Pending Prescriptions Disp Refills   losartan (COZAAR) 50 MG tablet 90 tablet 1    Sig: Take 1 tablet (50 mg total) by mouth daily.     Cardiovascular:  Angiotensin Receptor Blockers Failed - 10/23/2018 10:17 AM      Failed - Last BP in normal range    BP Readings from Last 1 Encounters:  07/06/18 (!) 142/74         Passed - Cr in normal range and within 180 days    Creat  Date Value Ref Range Status  06/23/2017 0.69 0.50 - 0.99 mg/dL Final    Comment:    For patients >19 years of age, the reference limit for Creatinine is approximately 13% higher for people identified as African-American. .    Creatinine, Ser  Date Value Ref Range Status  07/06/2018 0.81 0.40 - 1.20 mg/dL Final         Passed - K in normal range and within 180 days    Potassium  Date Value Ref Range Status  07/06/2018 4.2 3.5 - 5.1 mEq/L Final         Passed - Patient is not pregnant      Passed - Valid encounter within last 6 months    Recent Outpatient Visits          3 months ago B12 deficiency due to diet   [place], MD   9 months ago B12 deficiency due to [place] [name], MD   1 year ago Type 2 diabetes mellitus without complication, without long-term current use of insulin ([place])   [place], MD   1 year ago Type 2 diabetes mellitus without complication, without long-term current use of insulin ([place])   [place], MD   1 year ago Encounter for preventive health examination   [place], [place], MD      Future Appointments            In 2 months Derrel Nip,  Aris Everts, MD [place], [place]   In 5 months [place]Bl[place], Bryson [place], [place], [place]   In 5 months Derrel Nip, Aris Everts, MD [facility], [facility]

## 2018-12-28 ENCOUNTER — Other Ambulatory Visit (INDEPENDENT_AMBULATORY_CARE_PROVIDER_SITE_OTHER): Payer: Medicare Other

## 2018-12-28 ENCOUNTER — Other Ambulatory Visit: Payer: Self-pay

## 2018-12-28 DIAGNOSIS — E119 Type 2 diabetes mellitus without complications: Secondary | ICD-10-CM | POA: Diagnosis not present

## 2018-12-28 LAB — COMPREHENSIVE METABOLIC PANEL
ALT: 23 U/L (ref 0–35)
AST: 30 U/L (ref 0–37)
Albumin: 4.5 g/dL (ref 3.5–5.2)
Alkaline Phosphatase: 72 U/L (ref 39–117)
BUN: 5 mg/dL — ABNORMAL LOW (ref 6–23)
CO2: 29 mEq/L (ref 19–32)
Calcium: 9.7 mg/dL (ref 8.4–10.5)
Chloride: 103 mEq/L (ref 96–112)
Creatinine, Ser: 0.87 mg/dL (ref 0.40–1.20)
GFR: 78.2 mL/min (ref >60.00)
Glucose, Bld: 87 mg/dL (ref 70–99)
Potassium: 4.1 mEq/L (ref 3.5–5.1)
Sodium: 139 mEq/L (ref 135–145)
Total Bilirubin: 0.7 mg/dL (ref 0.2–1.2)
Total Protein: 7 g/dL (ref 6.0–8.3)

## 2018-12-28 LAB — LIPID PANEL
Cholesterol: 121 mg/dL (ref 0–200)
HDL: 45.7 mg/dL (ref >39.00)
LDL Cholesterol: 59 mg/dL (ref 0–99)
NonHDL: 75.61
Total CHOL/HDL Ratio: 3
Triglycerides: 82 mg/dL (ref 0.0–149.0)
VLDL: 16.4 mg/dL (ref 0.0–40.0)

## 2018-12-28 LAB — HEMOGLOBIN A1C: Hgb A1c MFr Bld: 6.1 % (ref 4.6–6.5)

## 2019-01-04 ENCOUNTER — Encounter: Payer: Self-pay | Admitting: Internal Medicine

## 2019-01-04 ENCOUNTER — Ambulatory Visit: Payer: Medicare Other | Admitting: Internal Medicine

## 2019-01-04 ENCOUNTER — Other Ambulatory Visit: Payer: Self-pay

## 2019-01-04 VITALS — BP 158/84 | HR 69 | Temp 97.9°F | Resp 15 | Ht 65.0 in | Wt 181.4 lb

## 2019-01-04 DIAGNOSIS — E1169 Type 2 diabetes mellitus with other specified complication: Secondary | ICD-10-CM | POA: Diagnosis not present

## 2019-01-04 DIAGNOSIS — T63304D Toxic effect of unspecified spider venom, undetermined, subsequent encounter: Secondary | ICD-10-CM

## 2019-01-04 DIAGNOSIS — I10 Essential (primary) hypertension: Secondary | ICD-10-CM | POA: Diagnosis not present

## 2019-01-04 DIAGNOSIS — E119 Type 2 diabetes mellitus without complications: Secondary | ICD-10-CM

## 2019-01-04 DIAGNOSIS — L089 Local infection of the skin and subcutaneous tissue, unspecified: Secondary | ICD-10-CM | POA: Diagnosis not present

## 2019-01-04 DIAGNOSIS — L309 Dermatitis, unspecified: Secondary | ICD-10-CM

## 2019-01-04 DIAGNOSIS — E785 Hyperlipidemia, unspecified: Secondary | ICD-10-CM

## 2019-01-04 MED ORDER — TRIAMCINOLONE ACETONIDE 0.1 % EX CREA: 1.0000 "application " | TOPICAL_CREAM | Freq: Two times a day (BID) | CUTANEOUS | 0 refills | Status: DC

## 2019-01-04 MED ORDER — LOSARTAN POTASSIUM 100 MG PO TABS: 100.0000 mg | ORAL_TABLET | Freq: Every day | ORAL | 1 refills | Status: DC

## 2019-01-04 MED ORDER — MUPIROCIN CALCIUM 2 % EX CREA: TOPICAL_CREAM | Freq: Two times a day (BID) | CUTANEOUS | Status: DC

## 2019-01-04 MED ORDER — MUPIROCIN 2 % EX OINT: 1.0000 "application " | TOPICAL_OINTMENT | Freq: Two times a day (BID) | CUTANEOUS | 0 refills | Status: DC

## 2019-01-04 NOTE — Progress Notes (Signed)
Subjective:  Patient ID: [name], female    DOB: 04/27/1951  Age: 68 y.o. MRN: [id number]  CC: The primary encounter diagnosis was Skin infection. Diagnoses of Type 2 diabetes mellitus without complication, without long-term current use of insulin ([place]), Essential hypertension, Hyperlipidemia associated with type 2 diabetes mellitus ([place]), Spider bite allergy, current reaction, undetermined intent, subsequent encounter, and Eczema of hand were also pertinent to this visit.  HPI Dianna Ewald Janvrin presents for follow up on type 2 DM , obesity and hypertension, as well as  b12 deficiency.  B12 deficiency:  intrinsic factor is negative , taking supplements in her B Complex.   Recent labs reviewed .  Restricting her diet     No meat ,  No beans due to gas despite trial of beano   HTN home readings elevated   Taking losartan 50 mg at night  Prior screening for OSA .   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 Used to see  S[facility]    2) recurrent spider bites on lower legs. Saw the spider in her bed .  Caused a  painful red bump  Along with discharge . Treated by Urgent care with mupirocin . First episode in October,  Then November  Had moved into a new house prior to episode  Lab Results  Component Value Date   HGBA1C 6.1 12/28/2018    Lab Results  Component Value Date   VITAMINB12 >1500 (H) 07/06/2018     Outpatient Medications Prior to Visit  Medication Sig Dispense Refill  . Ascorbic Acid (VITAMIN C) 100 MG tablet Take 100 mg by mouth daily.    . blood glucose meter kit and supplies KIT Use once daily to check fasting and post prandial blood sugars 1 each 0  . cetirizine (ZYRTEC) 10 MG tablet Take 10 mg by mouth daily.    . Chromium 200 MCG CAPS Take 1 capsule by mouth daily.    Marland Kitchen CINNAMON PO Take 2 capsules by mouth daily.    Marland Kitchen glucose blood test strip Use as  instructed 100 each 2  . glucose blood test strip Use as instructed 100 each 12  . ONETOUCH DELICA LANCETS 40J MISC Use once daily to check fasting or post prandial blood sugars 100 each 2  . ONETOUCH DELICA LANCETS FINE MISC Use as directed 100 each 2  . PANTOTHENIC ACID PO Take 1 capsule by mouth daily.    Marland Kitchen losartan (COZAAR) 50 MG tablet Take 1 tablet (50 mg total) by mouth daily. 90 tablet 1  . fluticasone (FLONASE) 50 MCG/ACT nasal spray Place into both nostrils daily.    Marland Kitchen dicyclomine (BENTYL) 10 MG capsule Take 1 capsule (10 mg total) by mouth 3 (three) times daily before meals. (Patient not taking: Reported on 07/06/2018) 90 capsule 0  . famotidine (PEPCID) 20 MG tablet Take 1 tablet (20 mg total) by mouth 2 (two) times daily. (Patient not taking: Reported on 07/06/2018) 60 tablet 1   No facility-administered medications prior to visit.     Review of Systems;  Patient denies headache, fevers, malaise, unintentional weight loss, skin rash, eye pain, sinus congestion and sinus pain, sore throat, dysphagia,  hemoptysis , cough, dyspnea, wheezing, chest pain, palpitations, orthopnea, edema, abdominal pain, nausea, melena, diarrhea, constipation, flank pain, dysuria, hematuria, urinary  Frequency, nocturia, numbness, tingling, seizures,  Focal weakness, Loss of consciousness,  Tremor, insomnia, depression, anxiety, and suicidal ideation.      Objective:  BP (!) 158/84 (BP Location: Left Arm, Patient Position: Sitting, Cuff Size: Normal)   Pulse 69   Temp 97.9 F (36.6 C) (Oral)   Resp 15   Ht _0  (1.651 m)   Wt 181 lb 6.4 oz (82.3 kg)   SpO2 98%   BMI 30.19 kg/m   BP Readings from Last 3 Encounters:  01/04/19 (!) 158/84  07/06/18 (!) 142/74  04/13/18 (!) 148/80    Wt Readings from Last 3 Encounters:  01/04/19 181 lb 6.4 oz (82.3 kg)  07/06/18 182 lb 3.2 oz (82.6 kg)  04/13/18 182 lb 12.8 oz (82.9 kg)    General appearance: alert, cooperative and appears stated age Ears:  normal TM's and external ear canals both ears Throat: lips, mucosa, and tongue normal; teeth and gums normal Neck: no adenopathy, no carotid bruit, supple, symmetrical, trachea midline and thyroid not enlarged, symmetric, no tenderness/mass/nodules Back: symmetric, no curvature. ROM normal. No CVA tenderness. Lungs: clear to auscultation bilaterally Heart: regular rate and rhythm, S1, S2 normal, no murmur, click, rub or gallop Abdomen: soft, non-tender; bowel sounds normal; no masses,  no organomegaly Pulses: 2+ and symmetric Skin: Skin color, texture, turgor normal. No rashes or lesions Lymph nodes: Cervical, supraclavicular, and axillary nodes normal.  Lab Results  Component Value Date   HGBA1C 6.1 12/28/2018   HGBA1C 6.2 07/06/2018   HGBA1C 6.0 12/26/2017    Lab Results  Component Value Date   CREATININE 0.87 12/28/2018   CREATININE 0.81 07/06/2018   CREATININE 0.82 12/26/2017    Lab Results  Component Value Date   WBC 5.2 05/19/2017   HGB 12.5 05/19/2017   HCT 39.1 05/19/2017   PLT 223.0 05/19/2017   GLUCOSE 87 12/28/2018   CHOL 121 12/28/2018   TRIG 82.0 12/28/2018   HDL 45.70 12/28/2018   LDLCALC 59 12/28/2018   ALT 23 12/28/2018   AST 30 12/28/2018   NA 139 12/28/2018   K 4.1 12/28/2018   CL 103 12/28/2018   CREATININE 0.87 12/28/2018   BUN 5 (L) 12/28/2018   CO2 29 12/28/2018   TSH 1.82 05/19/2017   HGBA1C 6.1 12/28/2018   MICROALBUR <0.7 12/29/2017    Mm Digital Screening  Result Date: 03/05/2018 CLINICAL DATA:  Screening. EXAM: DIGITAL SCREENING BILATERAL MAMMOGRAM WITH CAD COMPARISON:  Previous exam(s). ACR Breast Density Category b: There are scattered areas of fibroglandular density. FINDINGS: There are no findings suspicious for malignancy. Images were processed with CAD. IMPRESSION: No mammographic evidence of malignancy. A result letter of this screening mammogram will be mailed directly to the patient. RECOMMENDATION: Screening mammogram in one  year. (Code:SM-B-01Y) BI-RADS CATEGORY  1: Negative. Electronically Signed   By: Claudie Revering M.D.   On: 03/05/2018 14:09    Assessment & Plan:   Problem List Items Addressed This Visit    Type 2 diabetes mellitus without complication, without long-term current use of insulin ([place])    Impressive  Management with diet alone .  Advised to start a baby aspirin daily if tolerated.   Advised to avoid fasting states to avoid hypoglycemia.  RTC 6 months   Lab Results  Component Value Date   HGBA1C 6.1 12/28/2018   Lab Results  Component Value Date   MICROALBUR <0.7 12/29/2017         Relevant Medications   losartan (COZAAR) 100 MG tablet   Other Relevant Orders   Hemoglobin A1c  Microalbumin / creatinine urine ratio   Comprehensive metabolic panel   Spider bite allergy, current reaction, undetermined intent, subsequent encounter    Mupirocin refilled at patient's request      Essential hypertension    Not at goal.  Increasing losartan dose to 100 mg daily at night       Relevant Medications   losartan (COZAAR) 100 MG tablet   Eczema of hand    Trial of triamcinolone cream        Other Visit Diagnoses    Skin infection    -  Primary   Relevant Medications   mupirocin ointment (BACTROBAN) 2 %   Hyperlipidemia associated with type 2 diabetes mellitus ([facility])       Relevant Medications   losartan (COZAAR) 100 MG tablet   Other Relevant Orders   Lipid panel   TSH    A total of 25 minutes of face to face time was spent with patient more than half of which was spent in counselling about the above mentioned conditions  and coordination of care   I have discontinued Hayleigh M. Muenchow's dicyclomine and famotidine. I have also changed her losartan. Additionally, I am having her start on triamcinolone cream and mupirocin ointment. Lastly, I am having her maintain her vitamin C, cetirizine, fluticasone, blood glucose meter kit and supplies, Chromium, CINNAMON PO, PANTOTHENIC ACID PO,  glucose blood, OneTouch Delica Lancets Fine, OneTouch Delica Lancets 59X, and glucose blood.  Meds ordered this encounter  Medications  . triamcinolone cream (KENALOG) 0.1 %    Sig: Apply 1 application topically 2 (two) times daily.    Dispense:  30 g    Refill:  0  . DISCONTD: mupirocin cream (BACTROBAN) 2 %  . losartan (COZAAR) 100 MG tablet    Sig: Take 1 tablet (100 mg total) by mouth daily.    Dispense:  90 tablet    Refill:  1  . mupirocin ointment (BACTROBAN) 2 %    Sig: Place 1 application into the nose 2 (two) times daily.    Dispense:  22 g    Refill:  0    Medications Discontinued During This Encounter  Medication Reason  . dicyclomine (BENTYL) 10 MG capsule Error  . famotidine (PEPCID) 20 MG tablet Error  . losartan (COZAAR) 50 MG tablet   . mupirocin cream (BACTROBAN) 2 %     Follow-up: Return in about 6 months (around 07/07/2019) for CPE.   [name], MD

## 2019-01-04 NOTE — Patient Instructions (Addendum)
You can use triamcinolone ointment twice daily for your eczema breakouts.  Make sure you are keeping the areas moisturizer. Consider using Eucerin cream as your daily moisturizer   I have refilled the mupirocin antibiotic ointment   Your blood pressure is too high .  Please start taking a higher dose of your blood pressure medication,   100 mg losartan at night   Goal is < 130/80 . If you are not at goal after one week, let me know.  Follow up with Dr Bary Castilla for your breast issues     DASH Eating Plan DASH stands for "Dietary Approaches to Stop Hypertension." The DASH eating plan is a healthy eating plan that has been shown to reduce high blood pressure (hypertension). It may also reduce your risk for type 2 diabetes, heart disease, and stroke. The DASH eating plan may also help with weight loss. What are tips for following this plan?  General guidelines  Avoid eating more than 2,300 mg (milligrams) of salt (sodium) a day. If you have hypertension, you may need to reduce your sodium intake to 1,500 mg a day.  Limit alcohol intake to no more than 1 drink a day for nonpregnant women and 2 drinks a day for men. One drink equals 12 oz of beer, 5 oz of wine, or 1 oz of hard liquor.  Work with your health care provider to maintain a healthy body weight or to lose weight. Ask what an ideal weight is for you.  Get at least 30 minutes of exercise that causes your heart to beat faster (aerobic exercise) most days of the week. Activities may include walking, swimming, or biking.  Work with your health care provider or diet and nutrition specialist (dietitian) to adjust your eating plan to your individual calorie needs. Reading food labels   Check food labels for the amount of sodium per serving. Choose foods with less than 5 percent of the Daily Value of sodium. Generally, foods with less than 300 mg of sodium per serving fit into this eating plan.  To find whole grains, look for the word  "whole" as the first word in the ingredient list. Shopping  Buy products labeled as "low-sodium" or "no salt added."  Buy fresh foods. Avoid canned foods and premade or frozen meals. Cooking  Avoid adding salt when cooking. Use salt-free seasonings or herbs instead of table salt or sea salt. Check with your health care provider or pharmacist before using salt substitutes.  Do not fry foods. Cook foods using healthy methods such as baking, boiling, grilling, and broiling instead.  Cook with heart-healthy oils, such as olive, canola, soybean, or sunflower oil. Meal planning  Eat a balanced diet that includes: ? 5 or more servings of fruits and vegetables each day. At each meal, try to fill half of your plate with fruits and vegetables. ? Up to 6-8 servings of whole grains each day. ? Less than 6 oz of lean meat, poultry, or fish each day. A 3-oz serving of meat is about the same size as a deck of cards. One egg equals 1 oz. ? 2 servings of low-fat dairy each day. ? A serving of nuts, seeds, or beans 5 times each week. ? Heart-healthy fats. Healthy fats called Omega-3 fatty acids are found in foods such as flaxseeds and coldwater fish, like sardines, salmon, and mackerel.  Limit how much you eat of the following: ? Canned or prepackaged foods. ? Food that is high in trans fat, such  as fried foods. ? Food that is high in saturated fat, such as fatty meat. ? Sweets, desserts, sugary drinks, and other foods with added sugar. ? Full-fat dairy products.  Do not salt foods before eating.  Try to eat at least 2 vegetarian meals each week.  Eat more home-cooked food and less restaurant, buffet, and fast food.  When eating at a restaurant, ask that your food be prepared with less salt or no salt, if possible. What foods are recommended? The items listed may not be a complete list. Talk with your dietitian about what dietary choices are best for you. Grains Whole-grain or whole-wheat  bread. Whole-grain or whole-wheat pasta. Brown rice. [place] Morrow. Bulgur. Whole-grain and low-sodium cereals. Pita bread. Low-fat, low-sodium crackers. Whole-wheat flour tortillas. Vegetables Fresh or frozen vegetables (raw, steamed, roasted, or grilled). Low-sodium or reduced-sodium tomato and vegetable juice. Low-sodium or reduced-sodium tomato sauce and tomato paste. Low-sodium or reduced-sodium canned vegetables. Fruits All fresh, dried, or frozen fruit. Canned fruit in natural juice (without added sugar). Meat and other protein foods Skinless chicken or [place]. Ground chicken or [place]. Pork with fat trimmed off. Fish and seafood. Egg whites. Dried beans, peas, or lentils. Unsalted nuts, nut butters, and seeds. Unsalted canned beans. Lean cuts of beef with fat trimmed off. Low-sodium, lean deli meat. Dairy Low-fat (1%) or fat-free (skim) milk. Fat-free, low-fat, or reduced-fat cheeses. Nonfat, low-sodium ricotta or cottage cheese. Low-fat or nonfat yogurt. Low-fat, low-sodium cheese. Fats and oils Soft margarine without trans fats. Vegetable oil. Low-fat, reduced-fat, or light mayonnaise and salad dressings (reduced-sodium). Canola, safflower, olive, soybean, and sunflower oils. Avocado. Seasoning and other foods Herbs. Spices. Seasoning mixes without salt. Unsalted popcorn and pretzels. Fat-free sweets. What foods are not recommended? The items listed may not be a complete list. Talk with your dietitian about what dietary choices are best for you. Grains Baked goods made with fat, such as croissants, muffins, or some breads. Dry pasta or rice meal packs. Vegetables Creamed or fried vegetables. Vegetables in a cheese sauce. Regular canned vegetables (not low-sodium or reduced-sodium). Regular canned tomato sauce and paste (not low-sodium or reduced-sodium). Regular tomato and vegetable juice (not low-sodium or reduced-sodium). Angie Fava. Olives. Fruits Canned fruit in a light or heavy  syrup. Fried fruit. Fruit in cream or butter sauce. Meat and other protein foods Fatty cuts of meat. Ribs. Fried meat. Berniece Salines. Sausage. Bologna and other processed lunch meats. Salami. Fatback. Hotdogs. Bratwurst. Salted nuts and seeds. Canned beans with added salt. Canned or smoked fish. Whole eggs or egg yolks. Chicken or [place] with skin. Dairy Whole or 2% milk, cream, and half-and-half. Whole or full-fat cream cheese. Whole-fat or sweetened yogurt. Full-fat cheese. Nondairy creamers. Whipped toppings. Processed cheese and cheese spreads. Fats and oils Butter. Stick margarine. Lard. Shortening. Ghee. Bacon fat. Tropical oils, such as coconut, palm kernel, or palm oil. Seasoning and other foods Salted popcorn and pretzels. Onion salt, garlic salt, seasoned salt, table salt, and sea salt. Worcestershire sauce. Tartar sauce. Barbecue sauce. Teriyaki sauce. Soy sauce, including reduced-sodium. Steak sauce. Canned and packaged gravies. Fish sauce. Oyster sauce. Cocktail sauce. Horseradish that you find on the shelf. Ketchup. Mustard. Meat flavorings and tenderizers. Bouillon cubes. Hot sauce and Tabasco sauce. Premade or packaged marinades. Premade or packaged taco seasonings. Relishes. Regular salad dressings. Where to find more information:  National Heart, Lung, and [place]: https://wilson-eaton.com/  American Heart Association: www.heart.org Summary  The DASH eating plan is a healthy eating plan that has been shown  to reduce high blood pressure (hypertension). It may also reduce your risk for type 2 diabetes, heart disease, and stroke.  With the DASH eating plan, you should limit salt (sodium) intake to 2,300 mg a day. If you have hypertension, you may need to reduce your sodium intake to 1,500 mg a day.  When on the DASH eating plan, aim to eat more fresh fruits and vegetables, whole grains, lean proteins, low-fat dairy, and heart-healthy fats.  Work with your health care provider or diet and  nutrition specialist (dietitian) to adjust your eating plan to your individual calorie needs. This information is not intended to replace advice given to you by your health care provider. Make sure you discuss any questions you have with your health care provider. Document Released: 09/22/2011 Document Revised: 09/26/2016 Document Reviewed: 09/26/2016 Elsevier Interactive Patient Education  2019 [employer].

## 2019-01-06 DIAGNOSIS — L309 Dermatitis, unspecified: Secondary | ICD-10-CM | POA: Insufficient documentation

## 2019-01-06 DIAGNOSIS — T63304D Toxic effect of unspecified spider venom, undetermined, subsequent encounter: Secondary | ICD-10-CM | POA: Insufficient documentation

## 2019-01-06 NOTE — Assessment & Plan Note (Addendum)
Not at goal.  Increasing losartan dose to 100 mg daily at night

## 2019-01-06 NOTE — Assessment & Plan Note (Signed)
Impressive  Management with diet alone .  Advised to start a baby aspirin daily if tolerated.   Advised to avoid fasting states to avoid hypoglycemia.  RTC 6 months   Lab Results  Component Value Date   HGBA1C 6.1 12/28/2018   Lab Results  Component Value Date   MICROALBUR <0.7 12/29/2017

## 2019-01-06 NOTE — Assessment & Plan Note (Signed)
Trial of triamcinolone cream.

## 2019-01-06 NOTE — Assessment & Plan Note (Signed)
Mupirocin refilled at patient's request

## 2019-01-06 NOTE — Assessment & Plan Note (Signed)
She has maintained a 15 lb st loss over the last 12 months,  But her BMI is still 30.  Her personal best is 170 lbs.   I have addressed  BMI and recommended a low glycemic index diet utilizing smaller more frequent meals to increase metabolism.  I have also recommended that patient start exercising with a goal of 30 minutes of aerobic exercise a minimum of 5 days per week.

## 2019-02-11 ENCOUNTER — Other Ambulatory Visit: Payer: Self-pay

## 2019-02-11 ENCOUNTER — Other Ambulatory Visit: Payer: Self-pay | Admitting: Internal Medicine

## 2019-02-11 DIAGNOSIS — Z1231 Encounter for screening mammogram for malignant neoplasm of breast: Secondary | ICD-10-CM

## 2019-02-14 ENCOUNTER — Other Ambulatory Visit: Payer: Self-pay

## 2019-02-14 MED ORDER — GLUCOSE BLOOD VI STRP: ORAL_STRIP | 0 refills | Status: DC

## 2019-02-18 ENCOUNTER — Other Ambulatory Visit: Payer: Self-pay

## 2019-02-18 ENCOUNTER — Telehealth: Payer: Self-pay | Admitting: Internal Medicine

## 2019-02-18 MED ORDER — GLUCOSE BLOOD VI STRP: ORAL_STRIP | 0 refills | Status: DC

## 2019-02-18 NOTE — Progress Notes (Signed)
Pt had not received her strips from the pharmacy, I called the pharmacy it was sent to and the Glucose strips was sent to the wrong pharmacy, I reordered the RX and sent it to the correct pharmacy and I called the other pharmacy and cancelled the RX there. I called and explained this error to the pt. And she understood.  Nina,cma

## 2019-02-18 NOTE — Telephone Encounter (Signed)
Copied from [place] [phone]. Topic: Quick Communication - Rx Refill/Question >> Feb 18, 2019 11:01 AM Nils Flack wrote: Medication: one touch ultra blue test strips #100  Has the patient contacted their pharmacy? Yes.   (Agent: If no, request that the patient contact the pharmacy for the refill.) (Agent: If yes, when and what did the pharmacy advise?)  Preferred Pharmacy (with phone number or street name): [facility] [place] - [phone] Needs new rx - can not transfer due to medicare part b    Agent: Please be advised that RX refills may take up to 3 business days. We ask that you follow-up with your pharmacy.

## 2019-04-03 ENCOUNTER — Other Ambulatory Visit: Payer: Self-pay | Admitting: Unknown Physician Specialty

## 2019-04-03 DIAGNOSIS — E041 Nontoxic single thyroid nodule: Secondary | ICD-10-CM

## 2019-04-11 ENCOUNTER — Other Ambulatory Visit: Payer: Self-pay

## 2019-04-11 ENCOUNTER — Ambulatory Visit
Admission: RE | Admit: 2019-04-11 | Discharge: 2019-04-11 | Disposition: A | Discharge status: Home / Self Care | Payer: Medicare Other | Source: Ambulatory Visit | Attending: Unknown Physician Specialty | Admitting: Unknown Physician Specialty

## 2019-04-11 DIAGNOSIS — E041 Nontoxic single thyroid nodule: Secondary | ICD-10-CM | POA: Diagnosis not present

## 2019-04-12 ENCOUNTER — Ambulatory Visit
Admission: RE | Admit: 2019-04-12 | Discharge: 2019-04-12 | Disposition: A | Discharge status: Home / Self Care | Payer: Medicare Other | Source: Ambulatory Visit | Attending: General Surgery | Admitting: General Surgery

## 2019-04-12 DIAGNOSIS — Z1231 Encounter for screening mammogram for malignant neoplasm of breast: Secondary | ICD-10-CM | POA: Diagnosis present

## 2019-04-15 ENCOUNTER — Other Ambulatory Visit: Payer: Self-pay | Admitting: Unknown Physician Specialty

## 2019-04-15 DIAGNOSIS — E041 Nontoxic single thyroid nodule: Secondary | ICD-10-CM

## 2019-04-16 ENCOUNTER — Other Ambulatory Visit: Payer: Self-pay

## 2019-04-16 ENCOUNTER — Ambulatory Visit (INDEPENDENT_AMBULATORY_CARE_PROVIDER_SITE_OTHER): Payer: Medicare Other

## 2019-04-16 ENCOUNTER — Ambulatory Visit: Payer: Medicare Other | Admitting: Internal Medicine

## 2019-04-16 DIAGNOSIS — Z0000 Encounter for general adult medical examination without abnormal findings: Secondary | ICD-10-CM | POA: Diagnosis not present

## 2019-04-16 NOTE — Progress Notes (Addendum)
Subjective:   Kari Lyons is a 68 y.o. female who presents for Medicare Annual (Subsequent) preventive examination.  Review of Systems:  No ROS.  Medicare Wellness Virtual Visit.  Visual/audio telehealth visit, UTA vital signs.   See social history for additional risk factors.   Cardiac Risk Factors include: advanced age (>72mn, >>17women);hypertension;diabetes mellitus     Objective:     Vitals: There were no vitals taken for this visit.  There is no height or weight on file to calculate BMI.  Advanced Directives 04/16/2019 04/13/2018 06/12/2017  Does Patient Have a Medical Advance Directive? Yes Yes Yes  Type of Advance Directive Living will;Healthcare Power of [place]ort Moh[place] will [place]South Beth[place] will  Does patient want to make changes to medical advance directive? No - Patient declined No - Patient declined No - Patient declined  Copy of [place] Chart? Yes - validated most recent copy scanned in chart (See row information) Yes Yes    Tobacco Social History   Tobacco Use  Smoking Status Never Smoker  Smokeless Tobacco Never Used  Tobacco Comment   smoked for 324monthwhen [age]yrld     Counseling given: Not Answered Comment: smoked for 22mo24monthen 67yr34yr   Clinical Intake:  Pre-visit preparation completed: Yes        Diabetes: Yes(Followed by pcp)  How often do you need to have someone help you when you read instructions, pamphlets, or other written materials from your doctor or pharmacy?: 1 - Never  Interpreter Needed?: No     Past Medical History:  Diagnosis Date  . Anemia    H/O   . Arrhythmia 2008  . Arthritis   . 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 2009: Reported workup at [place] including ECHO, Stress Test and Cardiac Cath; records N/A.  ECHO with mild LVH, mild MVP; stress test and cath reportedly normal   . Depression   . Edema   . GERD  (gastroesophageal reflux disease)   . Hypertension   . IBS (irritable bowel syndrome)   . Mitral valve prolapse   . Patient is Jehovah's Witness   . Sleep apnea    MILD-NO CPAP    Past Surgical History:  Procedure Laterality Date  . ABDOMINAL HYSTERECTOMY  1985  . BILATERAL SALPINGOOPHORECTOMY  2002   due to fh of ovarian ca  . BREAST BIOPSY Right 03/09/2016   papilloma  . BREAST LUMPECTOMY Right 06/08/2016   Procedure: BREAST LUMPECTOMY WITH ULTRASOUND IN O.R;  Surgeon: SeeplChristene Lye  Location: [facility] ORS;  Service: General;  Laterality: Right;  . BREAST SURGERY Left 02-26-13   subareloar duct excision and mass  . COLONOSCOPY  2012  . EXCISION / BIOPSY BREAST / NIPPLE / DUCT Right 1985   negative  . EXCISION / BIOPSY BREAST / NIPPLE / DUCT Left 02/26/2013   intraductal papilloma  . EXCISION / BIOPSY BREAST / NIPPLE / DUCT Right 06/09/2016  . TONSILLECTOMY     Family History  Problem Relation Age of Onset  . Hypertension Mother   . Arthritis Mother   . Parkinson's disease Father   . Hypertension Sister   . Heart disease Daughter   . Cancer Sister 48   75  ovarian ca  . Cancer Brother        multiple myeloma  . Diabetes Brother   . Cancer Maternal Grandmother  colon ca  . Breast cancer Paternal Grandmother 14  . Bladder Cancer Paternal Uncle   . Prostate cancer Neg Hx   . Kidney cancer Neg Hx    Social History   Socioeconomic History  . Marital status: Married    Spouse name: Not on file  . Number of children: Not on file  . Years of education: Not on file  . Highest education level: Not on file  Occupational History  . Not on file  Social Needs  . Financial resource strain: Not hard at all  . Food insecurity    Worry: Never true    Inability: Never true  . Transportation needs    Medical: No    Non-medical: Not on file  Tobacco Use  . Smoking status: Never Smoker  . Smokeless tobacco: Never Used  . Tobacco comment: smoked for 47month  when 68yrold  Substance and Sexual Activity  . Alcohol use: No    Alcohol/week: 0.0 standard drinks  . Drug use: No  . Sexual activity: Not on file  Lifestyle  . Physical activity    Days per week: Not on file    Minutes per session: Not on file  . Stress: Not at all  Relationships  . Social coHerbalistn phone: Not on file    Gets together: Not on file    Attends religious service: Not on file    Active member of club or organization: Not on file    Attends meetings of clubs or organizations: Not on file    Relationship status: Not on file  Other Topics Concern  . Not on file  Social History Narrative  . Not on file    Outpatient Encounter Medications as of 04/16/2019  Medication Sig  . Ascorbic Acid (VITAMIN C) 100 MG tablet Take 100 mg by mouth daily.  . blood glucose meter kit and supplies KIT Use once daily to check fasting and post prandial blood sugars  . cetirizine (ZYRTEC) 10 MG tablet Take 10 mg by mouth daily.  . Chromium 200 MCG CAPS Take 1 capsule by mouth daily.  . Marland KitchenINNAMON PO Take 2 capsules by mouth daily.  . fluticasone (FLONASE) 50 MCG/ACT nasal spray Place into both nostrils daily.  . Marland Kitchenlucose blood (ONE TOUCH ULTRA TEST) test strip Use to check blood sugars once daily  . glucose blood test strip Use as instructed  . Lancets (ONETOUCH DELICA PLUS [id number] USE ONCE DAILY TO CHECK FASTING OR POST PRANDIAL BLOOD SUGARS  . losartan (COZAAR) 100 MG tablet Take 1 tablet (100 mg total) by mouth daily.  . mupirocin ointment (BACTROBAN) 2 % Place 1 application into the nose 2 (two) times daily.  . Glory RosebushELICA LANCETS FINE MISC Use as directed  . PANTOTHENIC ACID PO Take 1 capsule by mouth daily.  . Marland Kitchenriamcinolone cream (KENALOG) 0.1 % Apply 1 application topically 2 (two) times daily.   No facility-administered encounter medications on file as of 04/16/2019.     Activities of Daily Living In your present state of health, do you have any  difficulty performing the following activities: 04/16/2019  Hearing? Y  Comment Left ear hearing loss  Vision? N  Difficulty concentrating or making decisions? N  Walking or climbing stairs? N  Dressing or bathing? N  Doing errands, shopping? N  Preparing Food and eating ? N  Using the Toilet? N  In the past six months, have you accidently leaked urine? N  Do  you have problems with loss of bowel control? N  Managing your Medications? N  Managing your Finances? N  Housekeeping or managing your Housekeeping? N  Some recent data might be hidden    Patient Care Team: [name], MD as PCP - General (Internal Medicine) Christene Lye, MD (General Surgery) Albina Billet, MD (Internal Medicine)    Assessment:   This is a routine wellness examination for Evonda.  I connected with patient 04/16/19 at 11:30 AM EDT by an audio enabled telemedicine application and verified that I am speaking with the correct person using two identifiers. Patient stated full name and DOB. Patient gave permission to continue with virtual visit. Patient's location was at home and Nurse's location was at [place] office.   Health Screenings  Mammogram - 03/2019 Colonoscopy - 09/2011 Bone Density - 06/2016 Glaucoma -none Hearing -demonstrates normal hearing during visit. Hemoglobin A1C - 12/2018 Cholesterol - 12/2018 Dental- visit every 6 months [facility]- she plans to schedule; last diabetic eye exam 02/2018  Social  Alcohol intake - no          Smoking history- never    Smokers in home? none Illicit drug use? none Exercise - aerobics, weights, dance daily 30-60 minutes Diet - low carb Sexually Active -not currently BMI- discussed the importance of a healthy diet, water intake and the benefits of aerobic exercise.  Educational material provided.   Safety  Patient feels safe at home- yes Patient does have smoke detectors at home- yes Patient does wear sunscreen or protective clothing when in  direct sunlight -yes Patient does wear seat belt when in a moving vehicle -yes Patient drives- yes  [id number] precautions and sickness symptoms discussed.   Activities of Daily Living Patient denies needing assistance with: driving, household chores, feeding themselves, getting from bed to chair, getting to the toilet, bathing/showering, dressing, managing money, or preparing meals.  No new identified risk were noted.    Depression Screen Patient denies losing interest in daily life, feeling hopeless.  Taking medication as directed.   Medication-taking as directed and without issues.   Fall Screen Patient denies being afraid of falling or falling in the last year.   Memory Screen Patient is alert.  Patient denies difficulty focusing, concentrating or misplacing items. Correctly identified the president of the [place], season and recall. Patient likes to read and play crossword puzzles for brain stimulation.  Immunizations The following Immunizations were discussed: Influenza, shingles, pneumonia, and tetanus.   Other Providers Patient Care Team: [name], MD as PCP - General (Internal Medicine) Christene Lye, MD (General Surgery) Albina Billet, MD (Internal Medicine)  Exercise Activities and Dietary recommendations Current Exercise Habits: Home exercise routine, Type of exercise: walking;strength training/weights;stretching;calisthenics, Time (Minutes): 60, Frequency (Times/Week): 5, Weekly Exercise (Minutes/Week): 300, Intensity: Moderate  Goals      Patient Stated   . Follow up with Primary Care Provider (pt-stated)     As needed. Maintain healthy lifestyle.       Fall Risk Fall Risk  04/16/2019 04/13/2018 06/12/2017 05/19/2017 03/11/2016  Falls in the past year? 0 No Yes Yes No  Number falls in past yr: - - 1 1 -  Injury with Fall? - - Yes Yes -  Comment - - Hurt R knee but she did not seek medical attention. - -  Follow up - - Falls prevention  discussed;Education provided - -   Depression Screen PHQ 2/9 Scores 04/16/2019 04/13/2018 05/19/2017 03/11/2016  PHQ - 2  Score 1 0 6 0  PHQ- 9 Score - - 12 -     Cognitive Function MMSE - Mini Mental State Exam 04/13/2018 06/12/2017  Orientation to time 5 5  Orientation to Place 5 5  Registration 3 3  Attention/ Calculation 5 5  Recall 3 3  Language- name 2 objects 2 2  Language- repeat 1 1  Language- follow 3 step command 3 3  Language- read & follow direction 1 1  Write a sentence 1 1  Copy design 1 1  Total score 30 30     6CIT Screen 04/16/2019  What Year? 0 points  What month? 0 points  What time? 0 points  Count back from 20 0 points  Months in reverse 0 points  Repeat phrase 0 points  Total Score 0    Immunization History  Administered Date(s) Administered  . Influenza,inj,Quad PF,6+ Mos 09/15/2017, 07/06/2018   Screening Tests Health Maintenance  Topic Date Due  . OPHTHALMOLOGY EXAM  02/17/2019  . INFLUENZA VACCINE  05/18/2019  . HEMOGLOBIN A1C  06/30/2019  . FOOT EXAM  07/07/2019  . MAMMOGRAM  04/11/2021  . COLONOSCOPY  09/27/2021  . DEXA SCAN  Completed  . Hepatitis C Screening  Completed  . TETANUS/TDAP  Discontinued  . PNA vac Low Risk Adult  Discontinued      Plan:    End of life planning; Advance aging; Advanced directives discussed.  Copy of current HCPOA/Living Will on file.    I have personally reviewed and noted the following in the patient's chart:   . Medical and social history . Use of alcohol, tobacco or illicit drugs  . Current medications and supplements . Functional ability and status . Nutritional status . Physical activity . Advanced directives . List of other physicians . Hospitalizations, surgeries, and ER visits in previous 12 months . Vitals . Screenings to include cognitive, depression, and falls . Referrals and appointments  In addition, I have reviewed and discussed with patient certain preventive protocols, quality  metrics, and best practice recommendations. A written personalized care plan for preventive services as well as general preventive health recommendations were provided to patient.     OBrien-Blaney, Sarinah Doetsch L, LPN  [id number]/[id number]   I have reviewed the above information and agree with above.   Deborra Medina, MD

## 2019-04-16 NOTE — Patient Instructions (Addendum)
  Kari Lyons , Thank you for taking time to come for your Medicare Wellness Visit. I appreciate your ongoing commitment to your health goals. Please review the following plan we discussed and let me know if I can assist you in the future.   These are the goals we discussed: Goals      Patient Stated   . Follow up with Primary Care Provider (pt-stated)     As needed. Maintain healthy lifestyle.       This is a list of the screening recommended for you and due dates:  Health Maintenance  Topic Date Due  . Eye exam for diabetics  02/17/2019  . Flu Shot  05/18/2019  . Hemoglobin A1C  06/30/2019  . Complete foot exam   07/07/2019  . Mammogram  04/11/2021  . Colon Cancer Screening  09/27/2021  . DEXA scan (bone density measurement)  Completed  .  Hepatitis C: One time screening is recommended by Center for Disease Control  (CDC) for  adults born from [birth year] through 1966.   Completed  . Tetanus Vaccine  Discontinued  . Pneumonia vaccines  Discontinued

## 2019-04-25 ENCOUNTER — Encounter: Payer: Self-pay | Admitting: General Surgery

## 2019-04-25 ENCOUNTER — Other Ambulatory Visit: Payer: Self-pay

## 2019-04-25 ENCOUNTER — Ambulatory Visit: Payer: Medicare Other | Admitting: General Surgery

## 2019-04-25 VITALS — BP 142/78 | HR 80 | Temp 98.2°F | Ht 65.5 in | Wt 168.0 lb

## 2019-04-25 DIAGNOSIS — D369 Benign neoplasm, unspecified site: Secondary | ICD-10-CM

## 2019-04-25 NOTE — Patient Instructions (Addendum)
Follow up as needed.  Continue self breast exams. Call office for any new breast issues or concerns.

## 2019-04-25 NOTE — Progress Notes (Signed)
Patient ID: Kari Lyons, female   DOB: [birth year], 68 y.o.   MRN: [id number]  Chief Complaint  Patient presents with  . Follow-up    mammogram    HPI Kari Lyons is a 68 y.o. female who presents for a breast evaluation. The most recent mammogram was done on 04/12/19. Patient does perform regular self breast checks and gets regular mammograms done.    She also would like to review her thyroid report.  This study was ordered by Judi Saa, MD to follow-up multinodular goiter.   HPI  Past Medical History:  Diagnosis Date  . Anemia    H/O   . Arrhythmia 2008  . Arthritis   . 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 2009: Reported workup at [facility] including ECHO, Stress Test and Cardiac Cath; records N/A.  ECHO with mild LVH, mild MVP; stress test and cath reportedly normal   . Depression   . Edema   . GERD (gastroesophageal reflux disease)   . Hypertension   . IBS (irritable bowel syndrome)   . Mitral valve prolapse   . Patient is Jehovah's Witness   . Sleep apnea    MILD-NO CPAP     Past Surgical History:  Procedure Laterality Date  . ABDOMINAL HYSTERECTOMY  1985  . BILATERAL SALPINGOOPHORECTOMY  2002   due to fh of ovarian ca  . BREAST BIOPSY Right 03/09/2016   papilloma  . BREAST LUMPECTOMY Right 06/08/2016   Procedure: BREAST LUMPECTOMY WITH ULTRASOUND IN O.R;  Surgeon: Christene Lye, MD;  Location: [facility] ORS;  Service: General;  Laterality: Right;  . BREAST SURGERY Left 02-26-13   subareloar duct excision and mass  . COLONOSCOPY  2012  . EXCISION / BIOPSY BREAST / NIPPLE / DUCT Right 1985   negative  . EXCISION / BIOPSY BREAST / NIPPLE / DUCT Left 02/26/2013   intraductal papilloma  . EXCISION / BIOPSY BREAST / NIPPLE / DUCT Right 06/09/2016  . TONSILLECTOMY      Family History  Problem Relation Age of Onset  . Hypertension Mother   . Arthritis Mother   . Parkinson's disease Father   . Hypertension Sister   . Heart disease  Daughter   . Cancer Sister 55       ovarian ca  . Cancer Brother        multiple myeloma  . Diabetes Brother   . Cancer Maternal Grandmother        colon ca  . Breast cancer Paternal Grandmother 52  . Bladder Cancer Paternal Uncle   . Prostate cancer Neg Hx   . Kidney cancer Neg Hx     Social History Social History   Tobacco Use  . Smoking status: Never Smoker  . Smokeless tobacco: Never Used  . Tobacco comment: smoked for 25month when 68yrold  Substance Use Topics  . Alcohol use: No    Alcohol/week: 0.0 standard drinks  . Drug use: No    Allergies  Allergen Reactions  . Tetanus Toxoids   . Aloe Vera Rash    Current Outpatient Medications  Medication Sig Dispense Refill  . Ascorbic Acid (VITAMIN C) 100 MG tablet Take 100 mg by mouth daily.    . blood glucose meter kit and supplies KIT Use once daily to check fasting and post prandial blood sugars 1 each 0  . cetirizine (ZYRTEC) 10 MG tablet Take 10 mg by mouth daily.    . Chromium 200 MCG CAPS Take 1  capsule by mouth daily.    Marland Kitchen CINNAMON PO Take 2 capsules by mouth daily.    . fluticasone (FLONASE) 50 MCG/ACT nasal spray Place into both nostrils daily.    Marland Kitchen glucose blood (ONE TOUCH ULTRA TEST) test strip Use to check blood sugars once daily 100 each 0  . glucose blood test strip Use as instructed 100 each 2  . Lancets (ONETOUCH DELICA PLUS [id number]) MISC USE ONCE DAILY TO CHECK FASTING OR POST PRANDIAL BLOOD SUGARS 100 each 0  . losartan (COZAAR) 100 MG tablet Take 1 tablet (100 mg total) by mouth daily. 90 tablet 1  . mupirocin ointment (BACTROBAN) 2 % Place 1 application into the nose 2 (two) times daily. 22 g 0  . ONETOUCH DELICA LANCETS FINE MISC Use as directed 100 each 2  . PANTOTHENIC ACID PO Take 1 capsule by mouth daily.    Marland Kitchen triamcinolone cream (KENALOG) 0.1 % Apply 1 application topically 2 (two) times daily. 30 g 0   No current facility-administered medications for this visit.     Review of  Systems Review of Systems  Constitutional: Negative.   Respiratory: Negative.   Cardiovascular: Negative.     Blood pressure (!) 142/78, pulse 80, temperature 98.2 F (36.8 C), height 5' 5.5" (1.664 m), weight 168 lb (76.2 kg), SpO2 95 %.  Physical Exam Physical Exam Exam conducted with a chaperone present.  Constitutional:      Appearance: She is well-developed.  Eyes:     General: No scleral icterus.    Conjunctiva/sclera: Conjunctivae normal.  Neck:     Musculoskeletal: Neck supple.     Thyroid: Thyroid tenderness (right minimal) present.     Trachea: Trachea and phonation normal.   Cardiovascular:     Rate and Rhythm: Normal rate and regular rhythm.     Heart sounds: Normal heart sounds.  Pulmonary:     Effort: Pulmonary effort is normal.     Breath sounds: Normal breath sounds.  Chest:     Breasts: Breasts are asymmetrical (left breast larger than right).        Right: No inverted nipple, mass, nipple discharge, skin change or tenderness.        Left: No inverted nipple, mass, nipple discharge, skin change or tenderness.  Lymphadenopathy:     Cervical: No cervical adenopathy.     Upper Body:     Right upper body: No axillary adenopathy.     Left upper body: No axillary adenopathy.  Skin:    General: Skin is warm and dry.  Neurological:     Mental Status: She is alert and oriented to person, place, and time.  Psychiatric:        Mood and Affect: Mood normal.     Data Reviewed Bilateral screening mammograms dated April 12, 2019 were independently reviewed.  No interval change.  BI-RADS-1.  Thyroid ultrasound of the same date showed multiple nodules 8 mm or less.  Assessment Benign breast exam.  Unremarkable mammograms.  Multinodular goiter.  Plan  The patient has had several normal mammograms since her prior papilloma excision in 2017.    At this time I think annual screening exams and PCP evaluation is appropriate.  The patient was encouraged to  follow-up with Dr. Tami Ribas regarding his recommendations regarding her multinodular goiter.  Follow up as needed  HPI, Physical Exam, Assessment and Plan have been scribed under the direction and in the presence of Robert Bellow, MD  Concepcion Living, LPN  I  have completed the exam and reviewed the above documentation for accuracy and completeness.  I agree with the above.  Haematologist has been used and any errors in dictation or transcription are unintentional.  [name], M.D., F.A.C.S.  [name] Sheniya Garciaperez 04/25/2019, 7:21 PM

## 2019-06-19 ENCOUNTER — Other Ambulatory Visit: Payer: Self-pay | Admitting: Internal Medicine

## 2019-06-20 ENCOUNTER — Other Ambulatory Visit: Payer: Self-pay

## 2019-07-09 ENCOUNTER — Other Ambulatory Visit: Payer: Medicare Other

## 2019-07-10 ENCOUNTER — Other Ambulatory Visit: Payer: Self-pay | Admitting: Internal Medicine

## 2019-07-12 ENCOUNTER — Encounter: Payer: Medicare Other | Admitting: Internal Medicine

## 2019-07-30 ENCOUNTER — Other Ambulatory Visit (INDEPENDENT_AMBULATORY_CARE_PROVIDER_SITE_OTHER): Payer: Medicare Other

## 2019-07-30 ENCOUNTER — Other Ambulatory Visit: Payer: Self-pay

## 2019-07-30 DIAGNOSIS — E785 Hyperlipidemia, unspecified: Secondary | ICD-10-CM | POA: Diagnosis not present

## 2019-07-30 DIAGNOSIS — E1169 Type 2 diabetes mellitus with other specified complication: Secondary | ICD-10-CM | POA: Diagnosis not present

## 2019-07-30 DIAGNOSIS — E119 Type 2 diabetes mellitus without complications: Secondary | ICD-10-CM | POA: Diagnosis not present

## 2019-07-30 LAB — MICROALBUMIN / CREATININE URINE RATIO
Creatinine,U: 78.7 mg/dL
Microalb Creat Ratio: 0.9 mg/g (ref 0.0–30.0)
Microalb, Ur: <0.7 mg/dL (ref 0.0–1.9)

## 2019-07-30 LAB — COMPREHENSIVE METABOLIC PANEL
ALT: 22 U/L (ref 0–35)
AST: 26 U/L (ref 0–37)
Albumin: 4.3 g/dL (ref 3.5–5.2)
Alkaline Phosphatase: 80 U/L (ref 39–117)
BUN: 5 mg/dL — ABNORMAL LOW (ref 6–23)
CO2: 31 mEq/L (ref 19–32)
Calcium: 9.7 mg/dL (ref 8.4–10.5)
Chloride: 102 mEq/L (ref 96–112)
Creatinine, Ser: 0.85 mg/dL (ref 0.40–1.20)
GFR: 80.19 mL/min (ref >60.00)
Glucose, Bld: 93 mg/dL (ref 70–99)
Potassium: 3.9 mEq/L (ref 3.5–5.1)
Sodium: 140 mEq/L (ref 135–145)
Total Bilirubin: 0.7 mg/dL (ref 0.2–1.2)
Total Protein: 7.1 g/dL (ref 6.0–8.3)

## 2019-07-30 LAB — TSH: TSH: 4.56 u[IU]/mL — ABNORMAL HIGH (ref 0.35–4.50)

## 2019-07-30 LAB — LIPID PANEL
Cholesterol: 136 mg/dL (ref 0–200)
HDL: 56.9 mg/dL (ref >39.00)
LDL Cholesterol: 61 mg/dL (ref 0–99)
NonHDL: 78.7
Total CHOL/HDL Ratio: 2
Triglycerides: 87 mg/dL (ref 0.0–149.0)
VLDL: 17.4 mg/dL (ref 0.0–40.0)

## 2019-07-30 LAB — HEMOGLOBIN A1C: Hgb A1c MFr Bld: 6.2 % (ref 4.6–6.5)

## 2019-07-31 ENCOUNTER — Other Ambulatory Visit: Payer: Self-pay

## 2019-08-02 ENCOUNTER — Other Ambulatory Visit: Payer: Self-pay

## 2019-08-02 ENCOUNTER — Encounter: Payer: Self-pay | Admitting: Internal Medicine

## 2019-08-02 ENCOUNTER — Ambulatory Visit (INDEPENDENT_AMBULATORY_CARE_PROVIDER_SITE_OTHER): Payer: Medicare Other | Admitting: Internal Medicine

## 2019-08-02 VITALS — BP 138/70 | HR 79 | Temp 97.9°F | Ht 65.0 in | Wt 171.6 lb

## 2019-08-02 DIAGNOSIS — Z23 Encounter for immunization: Secondary | ICD-10-CM | POA: Diagnosis not present

## 2019-08-02 DIAGNOSIS — Z0001 Encounter for general adult medical examination with abnormal findings: Secondary | ICD-10-CM

## 2019-08-02 DIAGNOSIS — R5383 Other fatigue: Secondary | ICD-10-CM

## 2019-08-02 DIAGNOSIS — Z0000 Encounter for general adult medical examination without abnormal findings: Secondary | ICD-10-CM

## 2019-08-02 DIAGNOSIS — L739 Follicular disorder, unspecified: Secondary | ICD-10-CM | POA: Diagnosis not present

## 2019-08-02 DIAGNOSIS — E119 Type 2 diabetes mellitus without complications: Secondary | ICD-10-CM

## 2019-08-02 MED ORDER — TRIAMCINOLONE ACETONIDE 0.1 % EX CREA: 1.0000 "application " | TOPICAL_CREAM | Freq: Two times a day (BID) | CUTANEOUS | 0 refills | Status: DC

## 2019-08-02 NOTE — Patient Instructions (Signed)
I have refilled the triamcinolone ointment  Try using the mupiroci n ointment on the rash under your arm for a week  chekcing iron, b12 and cbc today for workup of fatigue  If all fine  We could  Consider a trial of thyroid medication    Health Maintenance After Age 69 After age 96, you are at a higher risk for certain long-term diseases and infections as well as injuries from falls. Falls are a major cause of broken bones and head injuries in people who are older than age 65. Getting regular preventive care can help to keep you healthy and well. Preventive care includes getting regular testing and making lifestyle changes as recommended by your health care provider. Talk with your health care provider about:  Which screenings and tests you should have. A screening is a test that checks for a disease when you have no symptoms.  A diet and exercise plan that is right for you. What should I know about screenings and tests to prevent falls? Screening and testing are the best ways to find a health problem early. Early diagnosis and treatment give you the best chance of managing medical conditions that are common after age 75. Certain conditions and lifestyle choices may make you more likely to have a fall. Your health care provider may recommend:  Regular vision checks. Poor vision and conditions such as cataracts can make you more likely to have a fall. If you wear glasses, make sure to get your prescription updated if your vision changes.  Medicine review. Work with your health care provider to regularly review all of the medicines you are taking, including over-the-counter medicines. Ask your health care provider about any side effects that may make you more likely to have a fall. Tell your health care provider if any medicines that you take make you feel dizzy or sleepy.  Osteoporosis screening. Osteoporosis is a condition that causes the bones to get weaker. This can make the bones weak and  cause them to break more easily.  Blood pressure screening. Blood pressure changes and medicines to control blood pressure can make you feel dizzy.  Strength and balance checks. Your health care provider may recommend certain tests to check your strength and balance while standing, walking, or changing positions.  Foot health exam. Foot pain and numbness, as well as not wearing proper footwear, can make you more likely to have a fall.  Depression screening. You may be more likely to have a fall if you have a fear of falling, feel emotionally low, or feel unable to do activities that you used to do.  Alcohol use screening. Using too much alcohol can affect your balance and may make you more likely to have a fall. What actions can I take to lower my risk of falls? General instructions  Talk with your health care provider about your risks for falling. Tell your health care provider if: ? You fall. Be sure to tell your health care provider about all falls, even ones that seem minor. ? You feel dizzy, sleepy, or off-balance.  Take over-the-counter and prescription medicines only as told by your health care provider. These include any supplements.  Eat a healthy diet and maintain a healthy weight. A healthy diet includes low-fat dairy products, low-fat (lean) meats, and fiber from whole grains, beans, and lots of fruits and vegetables. Home safety  Remove any tripping hazards, such as rugs, cords, and clutter.  Install safety equipment such as grab bars in  bathrooms and safety rails on stairs.  Keep rooms and walkways well-lit. Activity   Follow a regular exercise program to stay fit. This will help you maintain your balance. Ask your health care provider what types of exercise are appropriate for you.  If you need a cane or walker, use it as recommended by your health care provider.  Wear supportive shoes that have nonskid soles. Lifestyle  Do not drink alcohol if your health care  provider tells you not to drink.  If you drink alcohol, limit how much you have: ? 0-1 drink a day for women. ? 0-2 drinks a day for men.  Be aware of how much alcohol is in your drink. In the [place] one drink equals one typical bottle of beer (12 oz), one-half glass of wine (5 oz), or one shot of hard liquor (1 oz).  Do not use any products that contain nicotine or tobacco, such as cigarettes and e-cigarettes. If you need help quitting, ask your health care provider. Summary  Having a healthy lifestyle and getting preventive care can help to protect your health and wellness after age 104.  Screening and testing are the best way to find a health problem early and help you avoid having a fall. Early diagnosis and treatment give you the best chance for managing medical conditions that are more common for people who are older than age 33.  Falls are a major cause of broken bones and head injuries in people who are older than age 16. Take precautions to prevent a fall at home.  Work with your health care provider to learn what changes you can make to improve your health and wellness and to prevent falls. This information is not intended to replace advice given to you by your health care provider. Make sure you discuss any questions you have with your health care provider. Document Released: 08/16/2017 Document Revised: 01/24/2019 Document Reviewed: 08/16/2017 Elsevier Patient Education  2020 [employer].

## 2019-08-02 NOTE — Progress Notes (Signed)
Patient ID: Kari Lyons, female    DOB: December 05, 1950  Age: 68 y.o. MRN: [id number]  The patient is here for annual preventive  examination and management of other chronic and acute problems.  Flu vaccine given  Colonoscopy normal 2012  Mammogram normal June 2020. History of  intradermal papilloma      The risk factors are reflected in the social history.  The roster of all physicians providing medical care to patient - is listed in the Snapshot section of the chart.  Activities of daily living:  The patient is 100% independent in all ADLs: dressing, toileting, feeding as well as independent mobility  Home safety : The patient has smoke detectors in the home. They wear seatbelts.  There are no firearms at home. There is no violence in the home.   There is no risks for hepatitis, STDs or HIV. There is no   history of blood transfusion. They have no travel history to infectious disease endemic areas of the world.  The patient has seen their dentist in the last six month. They have seen their eye doctor in the last year. They admit to slight hearing difficulty with regard to whispered voices and some television programs.  They have deferred audiologic testing in the last year.  They do not  have excessive sun exposure. Discussed the need for sun protection: hats, long sleeves and use of sunscreen if there is significant sun exposure.   Diet: the importance of a healthy diet is discussed. They do have a healthy diet.  The benefits of regular aerobic exercise were discussed. She walks 4 times per week ,  20 minutes.   Depression screen: there are no signs or vegative symptoms of depression- irritability, change in appetite, anhedonia, sadness/tearfullness.  Cognitive assessment: the patient manages all their financial and personal affairs and is actively engaged. They could relate day,date,year and events; recalled 2/3 objects at 3 minutes; performed clock-face test normally.  The following  portions of the patient's history were reviewed and updated as appropriate: allergies, current medications, past family history, past medical history,  past surgical history, past social history  and problem list.  Visual acuity was not assessed per patient preference since she has regular follow up with her ophthalmologist. Hearing and body mass index were assessed and reviewed.   During the course of the visit the patient was educated and counseled about appropriate screening and preventive services including : fall prevention , diabetes screening, nutrition counseling, colorectal cancer screening, and recommended immunizations.    CC: The primary encounter diagnosis was Fatigue, unspecified type. Diagnoses of Need for immunization against influenza, Encounter for preventive health examination, Type 2 diabetes mellitus without complication, without long-term current use of insulin ([place]), and Folliculitis of axilla were also pertinent to this visit.  History of Left ear hearing loss ,  Still having soreness and pain on left side of head  Had CT head and sinuses by Anda Latina recommended daily sinus rinse  in  june   Multinodular goiter by US thyroid   Fatigue  uncertain etiology.  Started taking black strap molasses 1 tsp tid and starting to feel better   6 month follow up on diabetes.  Patient has no complaints today.  Patient is following a low glycemic index diet and taking all prescribed medications regularly without side effects.  Fasting sugars have been under less than 140 most of the time and post prandials have been under 160 except on rare occasions. Patient is exercising  about 3 times per week and intentionally trying to lose weight .  Patient has had an eye exam in the last 12 months and checks feet regularly for signs of infection.  Patient does not walk barefoot outside,  And denies an numbness tingling or burning in feet. Patient is up to date on all recommended  vaccinations.  Persistent pruritic rash left axilla , improves but does not resolve with triamcinolone ointment   History Kari Lyons has a past medical history of Anemia, Arrhythmia (2008), Arthritis, Beat, premature ventricular (02/24/2012), Depression, Edema, GERD (gastroesophageal reflux disease), Hypertension, IBS (irritable bowel syndrome), Mitral valve prolapse, Patient is Jehovah's Witness, and Sleep apnea.   She has a past surgical history that includes Bilateral salpingoophorectomy (2002); Abdominal hysterectomy (1985); Colonoscopy (2012); Tonsillectomy; Breast surgery (Left, 02-26-13); Breast lumpectomy (Right, 06/08/2016); Excision / biopsy breast / nipple / duct (Right, 1985); Excision / biopsy breast / nipple / duct (Left, 02/26/2013); Breast biopsy (Right, 03/09/2016); and Excision / biopsy breast / nipple / duct (Right, 06/09/2016).   Her family history includes Arthritis in her mother; Bladder Cancer in her paternal uncle; Breast cancer (age of onset: 96) in her paternal grandmother; Cancer in her brother and maternal grandmother; Cancer (age of onset: 32) in her sister; Diabetes in her brother; Heart disease in her daughter; Hypertension in her mother and sister; Parkinson's disease in her father.She reports that she has never smoked. She has never used smokeless tobacco. She reports that she does not drink alcohol or use drugs.  Outpatient Medications Prior to Visit  Medication Sig Dispense Refill  . Ascorbic Acid (VITAMIN C) 100 MG tablet Take 100 mg by mouth daily.    . blood glucose meter kit and supplies KIT Use once daily to check fasting and post prandial blood sugars 1 each 0  . cetirizine (ZYRTEC) 10 MG tablet Take 10 mg by mouth daily.    . Chromium 200 MCG CAPS Take 1 capsule by mouth daily.    . fluticasone (FLONASE) 50 MCG/ACT nasal spray Place into both nostrils daily.    Marland Kitchen glucose blood test strip Use as instructed 100 each 2  . Lancets (ONETOUCH DELICA PLUS [id number])  MISC USE ONCE DAILY TO CHECK FASTING OR POST PRANDIAL BLOOD SUGARS 100 each 0  . losartan (COZAAR) 100 MG tablet Take 1 tablet by mouth once daily 90 tablet 0  . mupirocin ointment (BACTROBAN) 2 % Place 1 application into the nose 2 (two) times daily. 22 g 0  . ONETOUCH DELICA LANCETS FINE MISC Use as directed 100 each 2  . ONETOUCH ULTRA test strip USE 1 STRIP TO CHECK GLUCOSE ONCE DAILY 100 each 0  . PANTOTHENIC ACID PO Take 1 capsule by mouth daily.    Marland Kitchen triamcinolone cream (KENALOG) 0.1 % Apply 1 application topically 2 (two) times daily. 30 g 0  . CINNAMON PO Take 2 capsules by mouth daily.     No facility-administered medications prior to visit.     Review of Systems  Patient denies headache, fevers, malaise, unintentional weight loss, skin rash, eye pain, sinus congestion and sinus pain, sore throat, dysphagia,  hemoptysis , cough, dyspnea, wheezing, chest pain, palpitations, orthopnea, edema, abdominal pain, nausea, melena, diarrhea, constipation, flank pain, dysuria, hematuria, urinary  Frequency, nocturia, numbness, tingling, seizures,  Focal weakness, Loss of consciousness,  Tremor, insomnia, depression, anxiety, and suicidal ideation.     Objective:  BP 138/70   Pulse 79   Temp 97.9 F (36.6 C)   Ht 5'  5" (1.651 m)   Wt 171 lb 9.6 oz (77.8 kg)   SpO2 98%   BMI 28.56 kg/m   Physical Exam   General appearance: alert, cooperative and appears stated age Head: Normocephalic, without obvious abnormality, atraumatic Eyes: conjunctivae/corneas clear. PERRL, EOM's intact. Fundi benign. Ears: normal TM's and external ear canals both ears Nose: Nares normal. Septum midline. Mucosa normal. No drainage or sinus tenderness. Throat: lips, mucosa, and tongue normal; teeth and gums normal Neck: no adenopathy, no carotid bruit, no JVD, supple, symmetrical, trachea midline and thyroid not enlarged, symmetric, no tenderness/mass/nodules Lungs: clear to auscultation bilaterally Breasts:  normal appearance, no masses or tenderness Heart: regular rate and rhythm, S1, S2 normal, no murmur, click, rub or gallop Abdomen: soft, non-tender; bowel sounds normal; no masses,  no organomegaly Extremities: extremities normal, atraumatic, no cyanosis or edema Pulses: 2+ and symmetric Skin: Left axilla with follicular rash limited to 7-8 hair follicles. Neurologic: Alert and oriented X 3, normal strength and tone. Normal symmetric reflexes. Normal coordination and gait.      Assessment & Plan:   Problem List Items Addressed This Visit      Unprioritized   Encounter for preventive health examination    age appropriate education and counseling updated, referrals for preventative services and immunizations addressed, dietary and smoking counseling addressed, most recent labs reviewed.  I have personally reviewed and have noted:  1) the patient's medical and social history 2) The pt's use of alcohol, tobacco, and illicit drugs 3) The patient's current medications and supplements 4) Functional ability including ADL's, fall risk, home safety risk, hearing and visual impairment 5) Diet and physical activities 6) Evidence for depression or mood disorder 7) The patient's height, weight, and BMI have been recorded in the chart  I have made referrals, and provided counseling and education based on review of the above      Type 2 diabetes mellitus without complication, without long-term current use of insulin ([place])    Impressive  Management with diet alone .  Advised to start a baby aspirin daily if tolerated.   Advised to avoid fasting states to avoid hypoglycemia.  RTC 6 months   Lab Results  Component Value Date   HGBA1C 6.2 07/30/2019   Lab Results  Component Value Date   MICROALBUR <0.7 07/30/2019         Fatigue - Primary    Etiology unclear.  Negative  screen for thyroid, anemia,  hepatic and renal insufficiency notes only a mildly abnormal TSH.  encourage regular exercise  5 days /week,  consider sleep study and cardiology evaluation if  Snoring noted or exertional dyspnea reported   Lab Results  Component Value Date   IRON 50 08/02/2019   TIBC 274 08/02/2019   FERRITIN 77 08/02/2019   Lab Results  Component Value Date   WBC 4.1 08/02/2019   HGB 12.9 08/02/2019   HCT 39.5 08/02/2019   MCV 86.2 08/02/2019   PLT 199 08/02/2019         Relevant Orders   CBC with Differential/Platelet (Completed)   Vitamin B12 (Completed)   Iron, TIBC and Ferritin Panel (Completed)   Folliculitis of axilla    Advised to use mupirocin topically twice daily instead of triamcinolone       Other Visit Diagnoses    Need for immunization against influenza       Relevant Orders   Flu Vaccine QUAD High Dose(Fluad) (Completed)      I have discontinued Kari M.  Lyons's CINNAMON PO. I am also having her maintain her vitamin C, cetirizine, fluticasone, blood glucose meter kit and supplies, Chromium, PANTOTHENIC ACID PO, glucose blood, OneTouch Delica Lancets Fine, mupirocin ointment, OneTouch Ultra, OneTouch Delica Plus [id number], losartan, and triamcinolone cream.  Meds ordered this encounter  Medications  . triamcinolone cream (KENALOG) 0.1 %    Sig: Apply 1 application topically 2 (two) times daily.    Dispense:  30 g    Refill:  0    Medications Discontinued During This Encounter  Medication Reason  . CINNAMON PO Patient has not taken in last 30 days  . triamcinolone cream (KENALOG) 0.1 % Reorder    Follow-up: Return in about 6 months (around 01/31/2020) for follow up diabetes.   [name], MD

## 2019-08-03 DIAGNOSIS — L739 Follicular disorder, unspecified: Secondary | ICD-10-CM | POA: Insufficient documentation

## 2019-08-03 DIAGNOSIS — R5383 Other fatigue: Secondary | ICD-10-CM | POA: Insufficient documentation

## 2019-08-03 LAB — CBC WITH DIFFERENTIAL/PLATELET
Absolute Monocytes: 312 cells/uL (ref 200–950)
Basophils Absolute: 21 cells/uL (ref 0–200)
Basophils Relative: 0.5 %
Eosinophils Absolute: 82 cells/uL (ref 15–500)
Eosinophils Relative: 2 %
HCT: 39.5 % (ref 35.0–45.0)
Hemoglobin: 12.9 g/dL (ref 11.7–15.5)
Lymphs Abs: 2161 cells/uL (ref 850–3900)
MCH: 28.2 pg (ref 27.0–33.0)
MCHC: 32.7 g/dL (ref 32.0–36.0)
MCV: 86.2 fL (ref 80.0–100.0)
MPV: 12.9 fL — ABNORMAL HIGH (ref 7.5–12.5)
Monocytes Relative: 7.6 %
Neutro Abs: 1525 cells/uL (ref 1500–7800)
Neutrophils Relative %: 37.2 %
Platelets: 199 10*3/uL (ref 140–400)
RBC: 4.58 10*6/uL (ref 3.80–5.10)
RDW: 14.4 % (ref 11.0–15.0)
Total Lymphocyte: 52.7 %
WBC: 4.1 10*3/uL (ref 3.8–10.8)

## 2019-08-03 LAB — IRON,TIBC AND FERRITIN PANEL
%SAT: 18 % (calc) (ref 16–45)
Ferritin: 77 ng/mL (ref 16–288)
Iron: 50 ug/dL (ref 45–160)
TIBC: 274 mcg/dL (calc) (ref 250–450)

## 2019-08-03 LAB — VITAMIN B12: Vitamin B-12: 1398 pg/mL — ABNORMAL HIGH (ref 200–1100)

## 2019-08-03 NOTE — Assessment & Plan Note (Signed)
Advised to use mupirocin topically twice daily instead of triamcinolone

## 2019-08-03 NOTE — Assessment & Plan Note (Signed)
Impressive  Management with diet alone .  Advised to start a baby aspirin daily if tolerated.   Advised to avoid fasting states to avoid hypoglycemia.  RTC 6 months   Lab Results  Component Value Date   HGBA1C 6.2 07/30/2019   Lab Results  Component Value Date   MICROALBUR <0.7 07/30/2019

## 2019-08-03 NOTE — Assessment & Plan Note (Signed)

## 2019-08-03 NOTE — Assessment & Plan Note (Addendum)
Etiology unclear.  Negative  screen for thyroid, anemia,  hepatic and renal insufficiency notes only a mildly abnormal TSH.  encourage regular exercise 5 days /week,  consider sleep study and cardiology evaluation if  Snoring noted or exertional dyspnea reported   Lab Results  Component Value Date   IRON 50 08/02/2019   TIBC 274 08/02/2019   FERRITIN 77 08/02/2019   Lab Results  Component Value Date   WBC 4.1 08/02/2019   HGB 12.9 08/02/2019   HCT 39.5 08/02/2019   MCV 86.2 08/02/2019   PLT 199 08/02/2019

## 2019-08-16 ENCOUNTER — Encounter: Payer: Self-pay | Admitting: Internal Medicine

## 2019-08-16 ENCOUNTER — Ambulatory Visit (INDEPENDENT_AMBULATORY_CARE_PROVIDER_SITE_OTHER): Payer: Medicare Other | Admitting: Internal Medicine

## 2019-08-16 VITALS — BP 137/82 | HR 59 | Ht 65.0 in | Wt 170.0 lb

## 2019-08-16 DIAGNOSIS — R5383 Other fatigue: Secondary | ICD-10-CM | POA: Diagnosis not present

## 2019-08-16 DIAGNOSIS — R7989 Other specified abnormal findings of blood chemistry: Secondary | ICD-10-CM

## 2019-08-16 LAB — HM DIABETES EYE EXAM

## 2019-08-16 NOTE — Progress Notes (Signed)
Virtual Visit via Telephone  Note  This visit type was conducted due to national recommendations for restrictions regarding the COVID-19 pandemic (e.g. social distancing).  This format is felt to be most appropriate for this patient at this time.  All issues noted in this document were discussed and addressed.  No physical exam was performed (except for noted visual exam findings with Video Visits).   I connected with@ on 08/16/19 at  4:30 PM EDT by  telephone and verified that I am speaking with the correct person using two identifiers. Location patient: home Location provider: work or home office Persons participating in the virtual visit: patient, provider  I discussed the limitations, risks, security and privacy concerns of performing an evaluation and management service by telephone and the availability of in person appointments. I also discussed with the patient that there may be a patient responsible charge related to this service. The patient expressed understanding and agreed to proceed.  Reason for visit: fatigue,  Possible narcolepsy  HPI:  68 yr old female with type 2 Dm recently seen for CPE presents for eview of labs done to rule out reversible causes of fatigue,   She describes the fatigue as recurrent and  Occurring suddenly and profoundly , to such a degree that she feels weak enough to drop to the floor.  Hypoglycemia has been ruled out , no report of palpitations, snoring ,  Or  history of OSA . She does have poo sleep.  Episoes have occurred while driving  resulting in her pulling over to the side of the road,  She reports a family history of narcolepsy.    ROS: See pertinent positives and negatives per HPI.  Past Medical History:  Diagnosis Date  . Anemia    H/O   . Arrhythmia 2008  . Arthritis   . 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 2009: Reported workup at [facility] including ECHO, Stress Test and Cardiac Cath; records N/A.  ECHO with mild LVH, mild  MVP; stress test and cath reportedly normal   . Depression   . Edema   . GERD (gastroesophageal reflux disease)   . Hypertension   . IBS (irritable bowel syndrome)   . Mitral valve prolapse   . Patient is Jehovah's Witness   . Sleep apnea    MILD-NO CPAP     Past Surgical History:  Procedure Laterality Date  . ABDOMINAL HYSTERECTOMY  1985  . BILATERAL SALPINGOOPHORECTOMY  2002   due to fh of ovarian ca  . BREAST BIOPSY Right 03/09/2016   papilloma  . BREAST LUMPECTOMY Right 06/08/2016   Procedure: BREAST LUMPECTOMY WITH ULTRASOUND IN O.R;  Surgeon: Christene Lye, MD;  Location: [facility] ORS;  Service: General;  Laterality: Right;  . BREAST SURGERY Left 02-26-13   subareloar duct excision and mass  . COLONOSCOPY  2012  . EXCISION / BIOPSY BREAST / NIPPLE / DUCT Right 1985   negative  . EXCISION / BIOPSY BREAST / NIPPLE / DUCT Left 02/26/2013   intraductal papilloma  . EXCISION / BIOPSY BREAST / NIPPLE / DUCT Right 06/09/2016  . TONSILLECTOMY      Family History  Problem Relation Age of Onset  . Hypertension Mother   . Arthritis Mother   . Parkinson's disease Father   . Hypertension Sister   . Heart disease Daughter   . Cancer Sister 55       ovarian ca  . Cancer Brother  multiple myeloma  . Diabetes Brother   . Cancer Maternal Grandmother        colon ca  . Breast cancer Paternal Grandmother 98  . Bladder Cancer Paternal Uncle   . Prostate cancer Neg Hx   . Kidney cancer Neg Hx     SOCIAL HX:  reports that she has never smoked. She has never used smokeless tobacco. She reports that she does not drink alcohol or use drugs.   Current Outpatient Medications:  .  Ascorbic Acid (VITAMIN C) 100 MG tablet, Take 100 mg by mouth daily., Disp: , Rfl:  .  B Complex-Biotin-FA (TH VITAMIN B 50/B-COMPLEX PO), Take by mouth., Disp: , Rfl:  .  blood glucose meter kit and supplies KIT, Use once daily to check fasting and post prandial blood sugars, Disp: 1 each, Rfl:  0 .  cetirizine (ZYRTEC) 10 MG tablet, Take 10 mg by mouth daily., Disp: , Rfl:  .  Cholecalciferol (VITAMIN D3) 10 MCG (400 UNIT) tablet, Take 400 Units by mouth daily., Disp: , Rfl:  .  Chromium 200 MCG CAPS, Take 1 capsule by mouth daily., Disp: , Rfl:  .  fluticasone (FLONASE) 50 MCG/ACT nasal spray, Place into both nostrils daily., Disp: , Rfl:  .  glucose blood test strip, Use as instructed, Disp: 100 each, Rfl: 2 .  Lancets (ONETOUCH DELICA PLUS [id number]) MISC, USE ONCE DAILY TO CHECK FASTING OR POST PRANDIAL BLOOD SUGARS, Disp: 100 each, Rfl: 0 .  losartan (COZAAR) 100 MG tablet, Take 1 tablet by mouth once daily, Disp: 90 tablet, Rfl: 0 .  mupirocin ointment (BACTROBAN) 2 %, Place 1 application into the nose 2 (two) times daily., Disp: 22 g, Rfl: 0 .  ONETOUCH DELICA LANCETS FINE MISC, Use as directed, Disp: 100 each, Rfl: 2 .  ONETOUCH ULTRA test strip, USE 1 STRIP TO CHECK GLUCOSE ONCE DAILY, Disp: 100 each, Rfl: 0 .  PANTOTHENIC ACID PO, Take 1 capsule by mouth daily., Disp: , Rfl:  .  Potassium 99 MG TABS, Take by mouth., Disp: , Rfl:  .  triamcinolone cream (KENALOG) 0.1 %, Apply 1 application topically 2 (two) times daily., Disp: 30 g, Rfl: 0 .  vitamin A 10000 UT capsule, Take 10,000 Units by mouth daily., Disp: , Rfl:  .  Zinc 30 MG TABS, Take by mouth., Disp: , Rfl:   EXAM:   General impression: alert, cooperative and articulate.  No signs of being in distress  Lungs: speech is fluent sentence length suggests that patient is not short of breath and not punctuated by cough, sneezing or sniffing. Marland Kitchen   Psych: affect normal.  speech is articulate and non pressured .  Denies suicidal thoughts Discussed the following assessment and plan:  Abnormal TSH - Plan: TSH  Fatigue, unspecified type  Fatigue Screening labs norma except for TSH above norma  With no other symptoms of underactive thyroid.  No history of snoring.  Recommended repeat TSh in 6 weeks followed by neurology  referral to rule out narcolepsy given  FH of same.    I discussed the assessment and treatment plan with the patient. The patient was provided an opportunity to ask questions and all were answered. The patient agreed with the plan and demonstrated an understanding of the instructions.   The patient was advised to call back or seek an in-person evaluation if the symptoms worsen or if the condition fails to improve as anticipated.  I provided  25 minutes of non-face-to-face time during  this encounter reviewing patient's current problems and post surgeries.  Providing counseling on the above mentioned problems , and coordination  of care . [name], MD

## 2019-08-18 NOTE — Assessment & Plan Note (Addendum)
Screening labs norma except for TSH above norma  With no other symptoms of underactive thyroid.  No history of snoring.  Recommended repeat TSh in 6 weeks followed by neurology referral to rule out narcolepsy given  FH of same.

## 2019-09-27 ENCOUNTER — Telehealth: Payer: Self-pay | Admitting: Internal Medicine

## 2019-09-27 MED ORDER — LOSARTAN POTASSIUM 100 MG PO TABS: 100.0000 mg | ORAL_TABLET | Freq: Every day | ORAL | 1 refills | Status: DC

## 2019-09-27 MED ORDER — ONETOUCH ULTRA VI STRP: ORAL_STRIP | 2 refills | Status: DC

## 2019-09-27 MED ORDER — ONETOUCH DELICA PLUS LANCET33G MISC: 1.0000 | Freq: Every day | 2 refills | Status: DC

## 2019-09-27 NOTE — Telephone Encounter (Signed)
Copied from [place] [id number]. Topic: General - Other >> Sep 27, 2019 11:13 AM Keene Breath wrote: Reason for CRM: Patient called to ask the nurse or doctor to call to explain why her medication requests were denied.  Patient is out of medication and does not know why the pharmacy said they were denied.  Please advise and call to discuss at [phone]

## 2019-09-27 NOTE — Telephone Encounter (Signed)
Spoke with pt to find out what medications she was needing refilled. Pt stated that she needed her Losartan, lancets, and test strips. I have refilled them and let pt know that they were refilled.

## 2019-10-14 ENCOUNTER — Ambulatory Visit: Payer: Medicare Other

## 2019-11-15 ENCOUNTER — Ambulatory Visit: Payer: Medicare PPO

## 2020-01-02 ENCOUNTER — Telehealth: Payer: Self-pay | Admitting: Internal Medicine

## 2020-01-02 NOTE — Telephone Encounter (Signed)
Pt needs a refill on Lancets (ONETOUCH DELICA PLUS [id number])  sent to [place]  Also needs to have a code on RX

## 2020-01-03 MED ORDER — ONETOUCH DELICA PLUS LANCET33G MISC: 1.0000 | Freq: Two times a day (BID) | 2 refills | Status: DC

## 2020-01-03 NOTE — Addendum Note (Signed)
Addended by: Adair Laundry on: 01/03/2020 03:51 PM   Modules accepted: Orders

## 2020-01-03 NOTE — Telephone Encounter (Signed)
Medication has been refilled.

## 2020-01-08 ENCOUNTER — Telehealth: Payer: Self-pay | Admitting: Internal Medicine

## 2020-01-08 MED ORDER — ONETOUCH DELICA PLUS LANCET33G MISC: 1.0000 | Freq: Two times a day (BID) | 2 refills | Status: DC

## 2020-01-08 MED ORDER — ONETOUCH ULTRA VI STRP: ORAL_STRIP | 2 refills | Status: DC

## 2020-01-08 NOTE — Telephone Encounter (Signed)
Strips and lancets have been sent to [facility] at [place].

## 2020-01-08 NOTE — Telephone Encounter (Signed)
Pt needs lancets and test strips sent to the [facility] at [place] -not [facility]//pt is out and was unable to check blood sugar the morning.

## 2020-01-09 MED ORDER — ACCU-CHEK AVIVA PLUS VI STRP: ORAL_STRIP | 12 refills | Status: DC

## 2020-01-09 MED ORDER — BLOOD GLUCOSE MONITOR KIT: PACK | 0 refills | Status: DC

## 2020-01-09 MED ORDER — ACCU-CHEK SOFTCLIX LANCETS MISC: 12 refills | Status: DC

## 2020-01-09 NOTE — Telephone Encounter (Signed)
Pt called and said that the lancets and the meter need to be from [employer] and she needs the whole new kit sent to [facility] [place] Her insurance has changed and this is what's covered

## 2020-01-09 NOTE — Addendum Note (Signed)
Addended by: Adair Laundry on: 01/09/2020 01:10 PM   Modules accepted: Orders

## 2020-01-09 NOTE — Telephone Encounter (Signed)
Glucose meter and supplies have been sent in for the accu chek brand.

## 2020-01-10 ENCOUNTER — Telehealth: Payer: Self-pay | Admitting: Internal Medicine

## 2020-01-10 ENCOUNTER — Ambulatory Visit: Payer: Medicare PPO

## 2020-01-10 ENCOUNTER — Other Ambulatory Visit: Payer: Self-pay

## 2020-01-10 MED ORDER — ACCU-CHEK AVIVA PLUS W/DEVICE KIT: 1.0000 | PACK | Freq: Every day | 0 refills | Status: DC

## 2020-01-10 NOTE — Telephone Encounter (Signed)
Pharmacy ([facility])needs a rx for [employer] sent over

## 2020-01-10 NOTE — Telephone Encounter (Signed)
Pt called in and said [facility] in [place] did not receive the prescription for the meter. She is requesting this be sent as soon as possible b/c she hasn't been able to check her sugar.

## 2020-01-10 NOTE — Telephone Encounter (Signed)
Accu-Chek Aviva meter sent to [facility].

## 2020-01-10 NOTE — Telephone Encounter (Signed)
Patient called and notified that meter was sent 10 minutes ago. I asked to check with pharmacy bc they should now have.

## 2020-02-03 ENCOUNTER — Telehealth: Payer: Self-pay | Admitting: Internal Medicine

## 2020-02-03 DIAGNOSIS — E785 Hyperlipidemia, unspecified: Secondary | ICD-10-CM

## 2020-02-03 DIAGNOSIS — E119 Type 2 diabetes mellitus without complications: Secondary | ICD-10-CM

## 2020-02-03 DIAGNOSIS — R7989 Other specified abnormal findings of blood chemistry: Secondary | ICD-10-CM

## 2020-02-03 DIAGNOSIS — E1169 Type 2 diabetes mellitus with other specified complication: Secondary | ICD-10-CM

## 2020-02-03 DIAGNOSIS — I10 Essential (primary) hypertension: Secondary | ICD-10-CM

## 2020-02-03 NOTE — Telephone Encounter (Signed)
Patient has an appointment with Dr. Derrel Nip this Thursday, 02/06/20. She would like to do labs before appointment. Only order is a TSH, does Dr. Derrel Nip want to do more labs? Let me know so I can schedule her. Thanks

## 2020-02-03 NOTE — Telephone Encounter (Signed)
Pt would like to have labs done prior to her appt on 02/06/2020. I have ordered A1c, CMP, lipid, and TSH. Is there anything else that needs to be ordered?

## 2020-02-05 ENCOUNTER — Other Ambulatory Visit (INDEPENDENT_AMBULATORY_CARE_PROVIDER_SITE_OTHER): Payer: Medicare PPO

## 2020-02-05 ENCOUNTER — Other Ambulatory Visit: Payer: Self-pay

## 2020-02-05 DIAGNOSIS — E785 Hyperlipidemia, unspecified: Secondary | ICD-10-CM | POA: Diagnosis not present

## 2020-02-05 DIAGNOSIS — I10 Essential (primary) hypertension: Secondary | ICD-10-CM | POA: Diagnosis not present

## 2020-02-05 DIAGNOSIS — E1169 Type 2 diabetes mellitus with other specified complication: Secondary | ICD-10-CM

## 2020-02-05 DIAGNOSIS — E119 Type 2 diabetes mellitus without complications: Secondary | ICD-10-CM | POA: Diagnosis not present

## 2020-02-05 DIAGNOSIS — R7989 Other specified abnormal findings of blood chemistry: Secondary | ICD-10-CM | POA: Diagnosis not present

## 2020-02-05 LAB — COMPREHENSIVE METABOLIC PANEL
ALT: 19 U/L (ref 0–35)
AST: 25 U/L (ref 0–37)
Albumin: 4.2 g/dL (ref 3.5–5.2)
Alkaline Phosphatase: 80 U/L (ref 39–117)
BUN: 6 mg/dL (ref 6–23)
CO2: 30 mEq/L (ref 19–32)
Calcium: 9.5 mg/dL (ref 8.4–10.5)
Chloride: 103 mEq/L (ref 96–112)
Creatinine, Ser: 0.87 mg/dL (ref 0.40–1.20)
GFR: 77.95 mL/min (ref >60.00)
Glucose, Bld: 95 mg/dL (ref 70–99)
Potassium: 5 mEq/L (ref 3.5–5.1)
Sodium: 137 mEq/L (ref 135–145)
Total Bilirubin: 0.7 mg/dL (ref 0.2–1.2)
Total Protein: 6.7 g/dL (ref 6.0–8.3)

## 2020-02-05 LAB — MICROALBUMIN / CREATININE URINE RATIO
Creatinine,U: 39 mg/dL
Microalb Creat Ratio: 1.8 mg/g (ref 0.0–30.0)
Microalb, Ur: <0.7 mg/dL (ref 0.0–1.9)

## 2020-02-05 LAB — LIPID PANEL
Cholesterol: 147 mg/dL (ref 0–200)
HDL: 51.3 mg/dL (ref >39.00)
LDL Cholesterol: 71 mg/dL (ref 0–99)
NonHDL: 95.98
Total CHOL/HDL Ratio: 3
Triglycerides: 125 mg/dL (ref 0.0–149.0)
VLDL: 25 mg/dL (ref 0.0–40.0)

## 2020-02-05 LAB — TSH: TSH: 2.76 u[IU]/mL (ref 0.35–4.50)

## 2020-02-05 LAB — HEMOGLOBIN A1C: Hgb A1c MFr Bld: 6.3 % (ref 4.6–6.5)

## 2020-02-06 ENCOUNTER — Other Ambulatory Visit: Payer: Self-pay

## 2020-02-06 ENCOUNTER — Ambulatory Visit: Payer: Medicare PPO | Admitting: Internal Medicine

## 2020-02-06 ENCOUNTER — Ambulatory Visit (INDEPENDENT_AMBULATORY_CARE_PROVIDER_SITE_OTHER): Payer: Medicare PPO

## 2020-02-06 ENCOUNTER — Encounter: Payer: Self-pay | Admitting: Internal Medicine

## 2020-02-06 VITALS — BP 120/76 | HR 69 | Temp 97.2°F | Ht 65.0 in | Wt 173.6 lb

## 2020-02-06 DIAGNOSIS — G8929 Other chronic pain: Secondary | ICD-10-CM

## 2020-02-06 DIAGNOSIS — E042 Nontoxic multinodular goiter: Secondary | ICD-10-CM

## 2020-02-06 DIAGNOSIS — M1711 Unilateral primary osteoarthritis, right knee: Secondary | ICD-10-CM | POA: Diagnosis not present

## 2020-02-06 DIAGNOSIS — I10 Essential (primary) hypertension: Secondary | ICD-10-CM

## 2020-02-06 DIAGNOSIS — Z807 Family history of other malignant neoplasms of lymphoid, hematopoietic and related tissues: Secondary | ICD-10-CM

## 2020-02-06 DIAGNOSIS — M50123 Cervical disc disorder at C6-C7 level with radiculopathy: Secondary | ICD-10-CM | POA: Diagnosis not present

## 2020-02-06 DIAGNOSIS — E1169 Type 2 diabetes mellitus with other specified complication: Secondary | ICD-10-CM

## 2020-02-06 DIAGNOSIS — M2550 Pain in unspecified joint: Secondary | ICD-10-CM

## 2020-02-06 DIAGNOSIS — E785 Hyperlipidemia, unspecified: Secondary | ICD-10-CM

## 2020-02-06 DIAGNOSIS — M25561 Pain in right knee: Secondary | ICD-10-CM

## 2020-02-06 DIAGNOSIS — D369 Benign neoplasm, unspecified site: Secondary | ICD-10-CM

## 2020-02-06 DIAGNOSIS — M130 Polyarthritis, unspecified: Secondary | ICD-10-CM

## 2020-02-06 DIAGNOSIS — E079 Disorder of thyroid, unspecified: Secondary | ICD-10-CM | POA: Diagnosis not present

## 2020-02-06 DIAGNOSIS — E119 Type 2 diabetes mellitus without complications: Secondary | ICD-10-CM

## 2020-02-06 LAB — CBC WITH DIFFERENTIAL/PLATELET
Basophils Absolute: 0 10*3/uL (ref 0.0–0.1)
Basophils Relative: 0.3 % (ref 0.0–3.0)
Eosinophils Absolute: 0.1 10*3/uL (ref 0.0–0.7)
Eosinophils Relative: 2.6 % (ref 0.0–5.0)
HCT: 38.6 % (ref 36.0–46.0)
Hemoglobin: 12.5 g/dL (ref 12.0–15.0)
Lymphocytes Relative: 47.5 % — ABNORMAL HIGH (ref 12.0–46.0)
Lymphs Abs: 2.4 10*3/uL (ref 0.7–4.0)
MCHC: 32.5 g/dL (ref 30.0–36.0)
MCV: 89 fl (ref 78.0–100.0)
Monocytes Absolute: 0.4 10*3/uL (ref 0.1–1.0)
Monocytes Relative: 8.6 % (ref 3.0–12.0)
Neutro Abs: 2.1 10*3/uL (ref 1.4–7.7)
Neutrophils Relative %: 41 % — ABNORMAL LOW (ref 43.0–77.0)
Platelets: 250 10*3/uL (ref 150.0–400.0)
RBC: 4.33 Mil/uL (ref 3.87–5.11)
RDW: 13.5 % (ref 11.5–15.5)
WBC: 5 10*3/uL (ref 4.0–10.5)

## 2020-02-06 LAB — SEDIMENTATION RATE: Sed Rate: 15 mm/hr (ref 0–30)

## 2020-02-06 LAB — C-REACTIVE PROTEIN: CRP: <1 mg/dL (ref 0.5–20.0)

## 2020-02-06 MED ORDER — TRIAMCINOLONE ACETONIDE 0.1 % EX CREA: 1.0000 "application " | TOPICAL_CREAM | Freq: Two times a day (BID) | CUTANEOUS | 0 refills | Status: DC

## 2020-02-06 NOTE — Progress Notes (Signed)
Subjective:  Patient ID: Kari Lyons, female    DOB: 09-09-1951  Age: 69 y.o. MRN: [id number]  CC: The primary encounter diagnosis was Arthralgia, unspecified joint. 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  HPI Kari Lyons presents for 6 month follow up on type 2 DM , hypertension,  And obesity  This visit occurred during the SARS-CoV-2 public health emergency.  Safety protocols were in place, including screening questions prior to the visit, additional usage of staff PPE, and extensive cleaning of exam room while observing appropriate contact time as indicated for disinfecting solutions.    Patient has received both doses of the Mondamin 19 vaccine without complications.  Patient continues to mask when outside of the home except when walking in yard or at safe distances from others .  Patient denies any change in mood or development of unhealthy behaviors resuting from the pandemic's restriction of activities and socialization.    Cc:  Several new issues and multiple concerns today.  40 minutes was spent with patient addressing all of her concerns.  Marland Kitchen   1) She has developed a painless mobile  Lump on left shoulder  .   2) she has been having multiple painful joints lasting several months.  Right hip,  Back of leg ,   Right knee hurting most of the time.  No swelling or warmth.    Some back pain brought on by yardwork, vacuuming  No history of fall.  Brother had multiple myeloma that started with pain in his neck.  And Friend's knee pain was determined to be due to  low back problems.  She desires further workup of both potential diagnoses. Her back pain does not radiate to knee,  But she does note some lateral right thigh pain aggravated   3) T2DM follow up:  6  month follow up on diabetes.  Patient has no complaints today.  Patient is following a low glycemic index diet for control of her DM .  Does not check her blood sugars except on rare occasions, when she feels poorly .  Patient is not  exercising except walking 3 times per week and into ntentionally trying to lose weight .  Patient has had an eye exam in the last 12 months and checks feet regularly for signs of infection.  Patient does not walk barefoot outside,  And denies  numbness tingling or burning in feet. Patient is up to date on all recommended vaccinations.she has been avoiding nuts because she read that omega 6 causes inflammation.    4)  HTN:  Patient is taking her medications as prescribed and notes no adverse effects.  Home BP readings have been done about once per week and are  generally < 130/80 .  She is avoiding added salt in her diet and walking regularly about 3 times per week for exercise  .    Outpatient Medications Prior to Visit  Medication Sig Dispense Refill  . Accu-Chek Softclix Lancets lancets Use to check blood sugar once daily. 100 each 12  . Ascorbic Acid (VITAMIN C) 100 MG tablet Take 100 mg by mouth daily.    . B Complex-Biotin-FA (  TH VITAMIN B 50/B-COMPLEX PO) Take by mouth.    . blood glucose meter kit and supplies KIT Use once daily to check fasting and post prandial blood sugars 1 each 0  . Blood Glucose Monitoring Suppl (ACCU-CHEK AVIVA PLUS) w/Device KIT 1 Device by Does not apply route daily. 1 kit 0  . cetirizine (ZYRTEC) 10 MG tablet Take 10 mg by mouth daily.    . Cholecalciferol (VITAMIN D3) 10 MCG (400 UNIT) tablet Take 400 Units by mouth daily.    . Chromium 200 MCG CAPS Take 1 capsule by mouth daily.    . fluticasone (FLONASE) 50 MCG/ACT nasal spray Place  into both nostrils daily.    Marland Kitchen glucose blood (ACCU-CHEK AVIVA PLUS) test strip Use to check blood sugar once daily 100 each 12  . glucose blood test strip Use as instructed 100 each 2  . losartan (COZAAR) 100 MG tablet Take 1 tablet (100 mg total) by mouth daily. 90 tablet 1  . mupirocin ointment (BACTROBAN) 2 % Place 1 application into the nose 2 (two) times daily. 22 g 0  . PANTOTHENIC ACID PO Take 1 capsule by mouth daily.    . Potassium 99 MG TABS Take by mouth.    . vitamin A 10000 UT capsule Take 10,000 Units by mouth daily.    . Zinc 30 MG TABS Take by mouth.    . triamcinolone cream (KENALOG) 0.1 % Apply 1 application topically 2 (two) times daily. 30 g 0   No facility-administered medications prior to visit.    Review of Systems;  Patient denies headache, fevers, malaise, unintentional weight loss, skin rash, eye pain, sinus congestion and sinus pain, sore throat, dysphagia,  hemoptysis , cough, dyspnea, wheezing, chest pain, palpitations, orthopnea, edema, abdominal pain, nausea, melena, diarrhea, constipation, flank pain, dysuria, hematuria, urinary  Frequency, nocturia, numbness, tingling, seizures,  Focal weakness, Loss of consciousness,  Tremor, insomnia, depression, anxiety, and suicidal ideation.      Objective:  BP 120/76 (BP Location: Left Arm, Patient Position: Sitting, Cuff Size: Small)   Pulse 69   Temp (!) 97.2 F (36.2 C) (Skin)   Ht '5\' 5"'  (1.651 m)   Wt 173 lb 9.6 oz (78.7 kg)   SpO2 98%   BMI 28.89 kg/m   BP Readings from Last 3 Encounters:  02/06/20 120/76  08/16/19 137/82  08/02/19 138/70    Wt Readings from Last 3 Encounters:  02/06/20 173 lb 9.6 oz (78.7 kg)  08/16/19 170 lb (77.1 kg)  08/02/19 171 lb 9.6 oz (77.8 kg)    General appearance: alert, cooperative and appears stated age Ears: normal TM's and external ear canals both ears Throat: lips, mucosa, and tongue normal; teeth and gums normal Neck: no adenopathy, no carotid bruit, supple,  symmetrical, trachea midline and thyroid not enlarged, symmetric, no tenderness/mass/nodules Back: symmetric, no curvature. ROM normal. No CVA tenderness. Lungs: clear to auscultation bilaterally Heart: regular rate and rhythm, S1, S2 normal, no murmur, click, rub or gallop Abdomen: soft, non-tender; bowel sounds normal; no masses,  no organomegaly Ext: right knee without crepitus , warmth or effusion. Slightly larger than left knee  Pulses: 2+ and symmetric Skin: Skin color, texture, turgor normal. No rashes or lesions Lymph nodes: Cervical, supraclavicular, and axillary nodes normal.  Lab Results  Component Value Date   HGBA1C 6.3 02/05/2020   HGBA1C 6.2 07/30/2019   HGBA1C 6.1 12/28/2018    Lab Results  Component Value Date   CREATININE 0.87 02/05/2020  CREATININE 0.85 07/30/2019   CREATININE 0.87 12/28/2018    Lab Results  Component Value Date   WBC 5.0 02/06/2020   HGB 12.5 02/06/2020   HCT 38.6 02/06/2020   PLT 250.0 02/06/2020   GLUCOSE 95 02/05/2020   CHOL 147 02/05/2020   TRIG 125.0 02/05/2020   HDL 51.30 02/05/2020   LDLCALC 71 02/05/2020   ALT 19 02/05/2020   AST 25 02/05/2020   NA 137 02/05/2020   K 5.0 02/05/2020   CL 103 02/05/2020   CREATININE 0.87 02/05/2020   BUN 6 02/05/2020   CO2 30 02/05/2020   TSH 1.90 02/06/2020   HGBA1C 6.3 02/05/2020   MICROALBUR <0.7 02/05/2020      Assessment & Plan:   Problem List Items Addressed This Visit      Unprioritized   Cervical disc disorder at C6-C7 level with radiculopathy    Reviewed MRI from 2018.  No sign of lytic lesions.  She deferred PT   No longer having tingling and numbness of left hand  Just neck pain and shoulder pain       Essential hypertension    Well controlled on current regimen. Renal function stable, no changes today.  Lab Results  Component Value Date   CREATININE 0.87 02/05/2020   Lab Results  Component Value Date   NA 137 02/05/2020   K 5.0 02/05/2020   CL 103 02/05/2020    CO2 30 02/05/2020         Family history of multiple myeloma    Her brother was diagnosed after presenting with neck pain.  Screening requested and in process.      Relevant Orders   Protein electrophoresis, serum   IFE AND PE, RANDOM URINE   CBC with Differential/Platelet (Completed)   Hyperlipidemia associated with type 2 diabetes mellitus (HCC)    Using the Framingham risk calculator,  her 10 year risk of coronary artery disease is 15%..  Will recommend statin therapy  Lab Results  Component Value Date   CHOL 147 02/05/2020   HDL 51.30 02/05/2020   LDLCALC 71 02/05/2020   TRIG 125.0 02/05/2020   CHOLHDL 3 02/05/2020         Intraductal papilloma    Last mammogram was normal June 2020      Multinodular goiter (nontoxic)    By June 2020 [place] ordered by ENT.  Numerous nodules , none met criteria for biopsy or follow up  Thyroid function is normal  Lab Results  Component Value Date   TSH 1.90 02/06/2020         Polyarthritis    She has pain in multiple joints.  Screening for inflammatory disorder was negative with ESR and CRP.   Lab Results  Component Value Date   ESRSEDRATE 15 02/06/2020   Lab Results  Component Value Date   CRP <1.0 02/06/2020        Right anterior knee pain    Plain films suggest degenerative changes and possible crystal deposition disease.  She has a small effusion.  Orthopedics referral advised.       Type 2 diabetes mellitus without complication, without long-term current use of insulin (HCC)     Remains.  well-controlled on diet alone .  hemoglobin A1c has been consistently at or  less than 7.0 . Patient is up-to-date on eye exams and foot exam is normal today. Patient has no microalbuminuria. Patient has deferred statin therapy for CAD risk reduction and on ACE/ARB for renal protection and hypertension  Lab Results  Component Value Date   HGBA1C 6.3 02/05/2020   Lab Results  Component Value Date   LABMICR See below:  10/30/2015   LABMICR See below: 10/13/2015   MICROALBUR <0.7 02/05/2020   MICROALBUR <0.7 07/30/2019    Lab Results  Component Value Date   CHOL 147 02/05/2020   HDL 51.30 02/05/2020   LDLCALC 71 02/05/2020   TRIG 125.0 02/05/2020   CHOLHDL 3 02/05/2020            Other Visit Diagnoses    Arthralgia, unspecified joint    -  Primary   Relevant Orders   Sedimentation rate (Completed)   C-reactive protein (Completed)   CBC with Differential/Platelet (Completed)   Chronic pain of right knee       Relevant Orders   DG Knee Complete 4 Views Right (Completed)   Thyroid disease       Relevant Orders   Thyroid Panel With TSH (Completed)      I am having Ilissa M. Garland maintain her vitamin C, cetirizine, fluticasone, Chromium, PANTOTHENIC ACID PO, glucose blood, mupirocin ointment, Vitamin D3, vitamin A, Potassium, Zinc, B Complex-Biotin-FA (TH VITAMIN B 50/B-COMPLEX PO), losartan, blood glucose meter kit and supplies, Accu-Chek Softclix Lancets, Accu-Chek Aviva Plus, Accu-Chek Aviva Plus, and triamcinolone cream.  Meds ordered this encounter  Medications  . triamcinolone cream (KENALOG) 0.1 %    Sig: Apply 1 application topically 2 (two) times daily.    Dispense:  30 g    Refill:  0    Medications Discontinued During This Encounter  Medication Reason  . triamcinolone cream (KENALOG) 0.1 % Reorder    Follow-up: Return in about 6 months (around 08/07/2020).   [name], MD

## 2020-02-06 NOTE — Assessment & Plan Note (Addendum)
Reviewed MRI from 2018.  No sign of lytic lesions.  She deferred PT   No longer having tingling and numbness of left hand  Just neck pain and shoulder pain

## 2020-02-06 NOTE — Patient Instructions (Addendum)
Your diabetes remains under excellent control  And your cholesterol and other labs are also normal. Please continue your current medications. return in 6 months for follow up on diabetes and make sure you are seeing your eye doctor at least once a year for a dilated retina exam to monitor for diabetic retinopathy,. changes that can lead to blindness .   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 Fatty liver disease is also called hepatic steatosis or steatohepatitis. The liver removes harmful substances from your bloodstream and produces fluids that your body needs. It also helps your body use and store energy from the food you eat. In many cases, fatty liver disease does not cause symptoms or problems. It is often diagnosed when tests are being done for other reasons. However, over time, fatty liver can cause inflammation that may lead to more serious liver problems, such as scarring of the liver (cirrhosis) and liver failure. Fatty liver is associated with insulin resistance, increased body fat, high blood pressure (hypertension), and high cholesterol. These are features of metabolic syndrome and increase your risk for stroke, diabetes, and heart disease. What are the causes? This condition may be caused by:  Drinking too much alcohol.  Poor nutrition.  Obesity.  Cushing's syndrome.  Diabetes.  High cholesterol.  Certain drugs.  Poisons.  Some viral infections.  Pregnancy. What increases the risk? You are more likely to develop this condition if you:  Abuse alcohol.  Are overweight.  Have  diabetes.  Have hepatitis.  Have a high triglyceride level.  Are pregnant. What are the signs or symptoms? Fatty liver disease often does not cause symptoms. If symptoms do develop, they can include:  Fatigue.  Weakness.  Weight loss.  Confusion.  Abdominal pain.  Nausea and vomiting.  Yellowing of your skin and the white parts of your eyes (jaundice).  Itchy skin. How is this diagnosed? This condition may be diagnosed by:  A physical exam and medical history.  Blood tests.  Imaging tests, such as an ultrasound, CT scan, or MRI.  A liver biopsy. A small sample of liver tissue is removed using a needle. The sample is then looked at under a microscope. How is this treated? Fatty liver disease is often caused by other health conditions. Treatment for fatty liver may involve medicines and lifestyle changes to manage conditions such as:  Alcoholism.  High cholesterol.  Diabetes.  Being overweight or obese. Follow these instructions at home:   Do not drink alcohol. If you have trouble quitting, ask your health care provider how to safely quit with the help of medicine or a supervised program. This is important to keep your condition from getting worse.  Eat a healthy diet as told by your health care provider. Ask your health care provider about working with a diet and nutrition specialist (dietitian) to develop an eating plan.  Exercise regularly. This can help you lose weight and control your cholesterol and diabetes. Talk to your health care provider about an exercise plan and which activities are best  for you.  Take over-the-counter and prescription medicines only as told by your health care provider.  Keep all follow-up visits as told by your health care provider. This is important. Contact a health care provider if: You have trouble controlling your:  Blood sugar. This is especially important if you have diabetes.  Cholesterol.  Drinking of alcohol. Get  help right away if:  You have abdominal pain.  You have jaundice.  You have nausea and vomiting.  You vomit blood or material that looks like coffee grounds.  You have stools that are black, tar-like, or bloody. Summary  Fatty liver disease develops when too much fat builds up in the cells of your liver.  Fatty liver disease often causes no symptoms or problems. However, over time, fatty liver can cause inflammation that may lead to more serious liver problems, such as scarring of the liver (cirrhosis).  You are more likely to develop this condition if you abuse alcohol, are pregnant, are overweight, have diabetes, have hepatitis, or have high triglyceride levels.  Contact your health care provider if you have trouble controlling your weight, blood sugar, cholesterol, or drinking of alcohol. This information is not intended to replace advice given to you by your health care provider. Make sure you discuss any questions you have with your health care provider. Document Revised: 09/15/2017 Document Reviewed: 07/12/2017 Elsevier Patient Education  2020 [employer].

## 2020-02-07 LAB — THYROID PANEL WITH TSH
Free Thyroxine Index: 2.3 (ref 1.4–3.8)
T3 Uptake: 33 % (ref 22–35)
T4, Total: 7 ug/dL (ref 5.1–11.9)
TSH: 1.9 mIU/L (ref 0.40–4.50)

## 2020-02-08 DIAGNOSIS — M25561 Pain in right knee: Secondary | ICD-10-CM | POA: Insufficient documentation

## 2020-02-08 DIAGNOSIS — I152 Hypertension secondary to endocrine disorders: Secondary | ICD-10-CM | POA: Insufficient documentation

## 2020-02-08 DIAGNOSIS — M130 Polyarthritis, unspecified: Secondary | ICD-10-CM | POA: Insufficient documentation

## 2020-02-08 DIAGNOSIS — E1159 Type 2 diabetes mellitus with other circulatory complications: Secondary | ICD-10-CM | POA: Insufficient documentation

## 2020-02-08 DIAGNOSIS — E042 Nontoxic multinodular goiter: Secondary | ICD-10-CM | POA: Insufficient documentation

## 2020-02-08 DIAGNOSIS — Z807 Family history of other malignant neoplasms of lymphoid, hematopoietic and related tissues: Secondary | ICD-10-CM | POA: Insufficient documentation

## 2020-02-08 DIAGNOSIS — E1169 Type 2 diabetes mellitus with other specified complication: Secondary | ICD-10-CM | POA: Insufficient documentation

## 2020-02-08 NOTE — Assessment & Plan Note (Signed)
Last mammogram was normal June 2020

## 2020-02-08 NOTE — Assessment & Plan Note (Signed)
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  She has a small effusion.  Orthopedics referral advised.

## 2020-02-08 NOTE — Assessment & Plan Note (Signed)
Well controlled on current regimen. Renal function stable, no changes today.  Lab Results  Component Value Date   CREATININE 0.87 02/05/2020   Lab Results  Component Value Date   NA 137 02/05/2020   K 5.0 02/05/2020   CL 103 02/05/2020   CO2 30 02/05/2020

## 2020-02-08 NOTE — Assessment & Plan Note (Signed)
She has pain in multiple joints.  Screening for inflammatory disorder was negative with ESR and CRP.   Lab Results  Component Value Date   ESRSEDRATE 15 02/06/2020   Lab Results  Component Value Date   CRP <1.0 02/06/2020

## 2020-02-08 NOTE — Assessment & Plan Note (Signed)
Her brother was diagnosed after presenting with neck pain.  Screening requested and in process.

## 2020-02-08 NOTE — Assessment & Plan Note (Signed)
By June 2020 [place] ordered by ENT.  Numerous nodules , none met criteria for biopsy or follow up  Thyroid function is normal  Lab Results  Component Value Date   TSH 1.90 02/06/2020

## 2020-02-08 NOTE — Assessment & Plan Note (Signed)
Using the Framingham risk calculator,  her 10 year risk of coronary artery disease is 15%..  Will recommend statin therapy  Lab Results  Component Value Date   CHOL 147 02/05/2020   HDL 51.30 02/05/2020   LDLCALC 71 02/05/2020   TRIG 125.0 02/05/2020   CHOLHDL 3 02/05/2020

## 2020-02-08 NOTE — Assessment & Plan Note (Addendum)
Remains.  well-controlled on diet alone .  hemoglobin A1c has been consistently at or  less than 7.0 . Patient is up-to-date on eye exams and foot exam is normal today. Patient has no microalbuminuria. Patient has deferred statin therapy for CAD risk reduction and on ACE/ARB for renal protection and hypertension    Lab Results  Component Value Date   HGBA1C 6.3 02/05/2020   Lab Results  Component Value Date   LABMICR See below: 10/30/2015   LABMICR See below: 10/13/2015   MICROALBUR <0.7 02/05/2020   MICROALBUR <0.7 07/30/2019    Lab Results  Component Value Date   CHOL 147 02/05/2020   HDL 51.30 02/05/2020   LDLCALC 71 02/05/2020   TRIG 125.0 02/05/2020   CHOLHDL 3 02/05/2020

## 2020-02-10 LAB — PROTEIN ELECTROPHORESIS, SERUM
Albumin ELP: 4.1 g/dL (ref 3.8–4.8)
Alpha 1: 0.3 g/dL (ref 0.2–0.3)
Alpha 2: 0.7 g/dL (ref 0.5–0.9)
Beta 2: 0.3 g/dL (ref 0.2–0.5)
Beta Globulin: 0.4 g/dL (ref 0.4–0.6)
Gamma Globulin: 1.1 g/dL (ref 0.8–1.7)
Total Protein: 6.8 g/dL (ref 6.1–8.1)

## 2020-02-10 NOTE — Addendum Note (Signed)
Addended by: [name] on: 02/10/2020 10:17 AM   Modules accepted: Orders

## 2020-02-11 LAB — IFE AND PE, RANDOM URINE
% BETA, Urine: 0 %
ALBUMIN, U: 100 %
ALPHA 1 URINE: 0 %
ALPHA-2-GLOBULIN, U: 0 %
GAMMA GLOBULIN URINE: 0 %
Protein, Ur: 6.2 mg/dL

## 2020-02-28 DIAGNOSIS — M25461 Effusion, right knee: Secondary | ICD-10-CM | POA: Diagnosis not present

## 2020-02-28 DIAGNOSIS — M25561 Pain in right knee: Secondary | ICD-10-CM | POA: Diagnosis not present

## 2020-02-28 DIAGNOSIS — M1711 Unilateral primary osteoarthritis, right knee: Secondary | ICD-10-CM | POA: Diagnosis not present

## 2020-03-13 ENCOUNTER — Other Ambulatory Visit: Payer: Self-pay

## 2020-03-13 ENCOUNTER — Ambulatory Visit
Admission: RE | Admit: 2020-03-13 | Discharge: 2020-03-13 | Disposition: A | Discharge status: Home / Self Care | Payer: Medicare PPO | Source: Ambulatory Visit | Attending: Unknown Physician Specialty | Admitting: Unknown Physician Specialty

## 2020-03-13 DIAGNOSIS — E041 Nontoxic single thyroid nodule: Secondary | ICD-10-CM | POA: Diagnosis not present

## 2020-03-13 DIAGNOSIS — E042 Nontoxic multinodular goiter: Secondary | ICD-10-CM | POA: Diagnosis not present

## 2020-04-09 ENCOUNTER — Other Ambulatory Visit: Payer: Self-pay | Admitting: Internal Medicine

## 2020-04-16 ENCOUNTER — Ambulatory Visit: Payer: Medicare Other

## 2020-04-23 ENCOUNTER — Telehealth: Payer: Self-pay

## 2020-04-23 ENCOUNTER — Ambulatory Visit (INDEPENDENT_AMBULATORY_CARE_PROVIDER_SITE_OTHER): Payer: Medicare PPO

## 2020-04-23 VITALS — Ht 65.0 in | Wt 173.0 lb

## 2020-04-23 DIAGNOSIS — Z0000 Encounter for general adult medical examination without abnormal findings: Secondary | ICD-10-CM | POA: Diagnosis not present

## 2020-04-23 DIAGNOSIS — Z1231 Encounter for screening mammogram for malignant neoplasm of breast: Secondary | ICD-10-CM

## 2020-04-23 DIAGNOSIS — M25361 Other instability, right knee: Secondary | ICD-10-CM

## 2020-04-23 NOTE — Telephone Encounter (Signed)
Patient requests new Rx for losartan.  She would like to try lower dose, daily. Currently taking 100mg .  Notes she has a no/low salt diet, monitors BP regularly, generally stays 130s/80s.  Concerns of taking high dose for extended period of time. Follow up appointment offered, declined. CPE scheduled 08/14/20. Please send to local pharmacy on file if permissible.

## 2020-04-23 NOTE — Patient Instructions (Addendum)
[name] , Thank you for taking time to come for your Medicare Wellness Visit. I appreciate your ongoing commitment to your health goals. Please review the following plan we discussed and let me know if I can assist you in the future.   These are the goals we discussed: Goals      Patient Stated   .  Increase physical activity (pt-stated)      I would like to walk more for exercise       This is a list of the screening recommended for you and due dates:  Health Maintenance  Topic Date Due  . Mammogram  04/11/2020  . Flu Shot  05/17/2020  . Hemoglobin A1C  08/06/2020  . Eye exam for diabetics  08/15/2020  . Complete foot exam   02/05/2021  . Colon Cancer Screening  09/27/2021  . DEXA scan (bone density measurement)  Completed  . COVID-19 Vaccine  Completed  .  Hepatitis C: One time screening is recommended by Center for Disease Control  (CDC) for  adults born from [birth year] through 1966.   Completed  . Tetanus Vaccine  Discontinued  . Pneumonia vaccines  Discontinued   Immunizations Immunization History  Administered Date(s) Administered  . Fluad Quad(high Dose 65+) 08/02/2019  . Influenza,inj,Quad PF,6+ Mos 09/15/2017, 07/06/2018  . Moderna SARS-COVID-2 Vaccination 12/31/2019, 01/28/2020   Keep all routine maintenance appointments.   Cpe 08/14/20 @ 10:30.  Advanced directives: yes, on file  Mammogram- ordered today. Call to schedule [phone].   Referral sent to connected care for elevated toilet seat. Someone will contact you next week.   Follow up in one year for your annual wellness visit    Preventive Care 65 Years and Older, Female Preventive care refers to lifestyle choices and visits with your health care provider that can promote health and wellness. What does preventive care include?  A yearly physical exam. This is also called an annual well check.  Dental exams once or twice a year.  Routine eye exams. Ask your health care provider how often you  should have your eyes checked.  Personal lifestyle choices, including:  Daily care of your teeth and gums.  Regular physical activity.  Eating a healthy diet.  Avoiding tobacco and drug use.  Limiting alcohol use.  Practicing safe sex.  Taking low-dose aspirin every day.  Taking vitamin and mineral supplements as recommended by your health care provider. What happens during an annual well check? The services and screenings done by your health care provider during your annual well check will depend on your age, overall health, lifestyle risk factors, and family history of disease. Counseling  Your health care provider may ask you questions about your:  Alcohol use.  Tobacco use.  Drug use.  Emotional well-being.  Home and relationship well-being.  Sexual activity.  Eating habits.  History of falls.  Memory and ability to understand (cognition).  Work and work Statistician.  Reproductive health. Screening  You may have the following tests or measurements:  Height, weight, and BMI.  Blood pressure.  Lipid and cholesterol levels. These may be checked every 5 years, or more frequently if you are over 30 years old.  Skin check.  Lung cancer screening. You may have this screening every year starting at age 71 if you have a 30-pack-year history of smoking and currently smoke or have quit within the past 15 years.  Fecal occult blood test (FOBT) of the stool. You may have this test every year starting  at age 31.  Flexible sigmoidoscopy or colonoscopy. You may have a sigmoidoscopy every 5 years or a colonoscopy every 10 years starting at age 78.  Hepatitis C blood test.  Hepatitis B blood test.  Sexually transmitted disease (STD) testing.  Diabetes screening. This is done by checking your blood sugar (glucose) after you have not eaten for a while (fasting). You may have this done every 1-3 years.  Bone density scan. This is done to screen for osteoporosis.  You may have this done starting at age 62.  Mammogram. This may be done every 1-2 years. Talk to your health care provider about how often you should have regular mammograms. Talk with your health care provider about your test results, treatment options, and if necessary, the need for more tests. Vaccines  Your health care provider may recommend certain vaccines, such as:  Influenza vaccine. This is recommended every year.  Tetanus, diphtheria, and acellular pertussis (Tdap, Td) vaccine. You may need a Td booster every 10 years.  Zoster vaccine. You may need this after age 54.  Pneumococcal 13-valent conjugate (PCV13) vaccine. One dose is recommended after age 48.  Pneumococcal polysaccharide (PPSV23) vaccine. One dose is recommended after age 34. Talk to your health care provider about which screenings and vaccines you need and how often you need them. This information is not intended to replace advice given to you by your health care provider. Make sure you discuss any questions you have with your health care provider. Document Released: 10/30/2015 Document Revised: 06/22/2016 Document Reviewed: 08/04/2015 Elsevier Interactive Patient Education  2017 [place] Prevention in the Home Falls can cause injuries. They can happen to people of all ages. There are many things you can do to make your home safe and to help prevent falls. What can I do on the outside of my home?  Regularly fix the edges of walkways and driveways and fix any cracks.  Remove anything that might make you trip as you walk through a door, such as a raised step or threshold.  Trim any bushes or trees on the path to your home.  Use bright outdoor lighting.  Clear any walking paths of anything that might make someone trip, such as rocks or tools.  Regularly check to see if handrails are loose or broken. Make sure that both sides of any steps have handrails.  Any raised decks and porches should have  guardrails on the edges.  Have any leaves, snow, or ice cleared regularly.  Use sand or salt on walking paths during winter.  Clean up any spills in your garage right away. This includes oil or grease spills. What can I do in the bathroom?  Use night lights.  Install grab bars by the toilet and in the tub and shower. Do not use towel bars as grab bars.  Use non-skid mats or decals in the tub or shower.  If you need to sit down in the shower, use a plastic, non-slip stool.  Keep the floor dry. Clean up any water that spills on the floor as soon as it happens.  Remove soap buildup in the tub or shower regularly.  Attach bath mats securely with double-sided non-slip rug tape.  Do not have throw rugs and other things on the floor that can make you trip. What can I do in the bedroom?  Use night lights.  Make sure that you have a light by your bed that is easy to reach.  Do not use  any sheets or blankets that are too big for your bed. They should not hang down onto the floor.  Have a firm chair that has side arms. You can use this for support while you get dressed.  Do not have throw rugs and other things on the floor that can make you trip. What can I do in the kitchen?  Clean up any spills right away.  Avoid walking on wet floors.  Keep items that you use a lot in easy-to-reach places.  If you need to reach something above you, use a strong step stool that has a grab bar.  Keep electrical cords out of the way.  Do not use floor polish or wax that makes floors slippery. If you must use wax, use non-skid floor wax.  Do not have throw rugs and other things on the floor that can make you trip. What can I do with my stairs?  Do not leave any items on the stairs.  Make sure that there are handrails on both sides of the stairs and use them. Fix handrails that are broken or loose. Make sure that handrails are as long as the stairways.  Check any carpeting to make sure that  it is firmly attached to the stairs. Fix any carpet that is loose or worn.  Avoid having throw rugs at the top or bottom of the stairs. If you do have throw rugs, attach them to the floor with carpet tape.  Make sure that you have a light switch at the top of the stairs and the bottom of the stairs. If you do not have them, ask someone to add them for you. What else can I do to help prevent falls?  Wear shoes that:  Do not have high heels.  Have rubber bottoms.  Are comfortable and fit you well.  Are closed at the toe. Do not wear sandals.  If you use a stepladder:  Make sure that it is fully opened. Do not climb a closed stepladder.  Make sure that both sides of the stepladder are locked into place.  Ask someone to hold it for you, if possible.  Clearly mark and make sure that you can see:  Any grab bars or handrails.  First and last steps.  Where the edge of each step is.  Use tools that help you move around (mobility aids) if they are needed. These include:  Canes.  Walkers.  Scooters.  Crutches.  Turn on the lights when you go into a dark area. Replace any light bulbs as soon as they burn out.  Set up your furniture so you have a clear path. Avoid moving your furniture around.  If any of your floors are uneven, fix them.  If there are any pets around you, be aware of where they are.  Review your medicines with your doctor. Some medicines can make you feel dizzy. This can increase your chance of falling. Ask your doctor what other things that you can do to help prevent falls. This information is not intended to replace advice given to you by your health care provider. Make sure you discuss any questions you have with your health care provider. Document Released: 07/30/2009 Document Revised: 03/10/2016 Document Reviewed: 11/07/2014 Elsevier Interactive Patient Education  2017 [place] A mammogram is a low energy X-ray of the breasts  that is done to check for abnormal changes. This procedure can screen for and detect any changes that may indicate breast  cancer. Mammograms are regularly done on women. A man may have a mammogram if he has a lump or swelling in his breast. A mammogram can also identify other changes and variations in the breast, such as:  Inflammation of the breast tissue (mastitis).  An infected area that contains a collection of pus (abscess).  A fluid-filled sac (cyst).  Fibrocystic changes. This is when breast tissue becomes denser, which can make the tissue feel rope-like or uneven under the skin.  Tumors that are not cancerous (benign). Tell a health care provider:  About any allergies you have.  If you have breast implants.  If you have had previous breast disease, biopsy, or surgery.  If you are breastfeeding.  If you are younger than age 70.  If you have a family history of breast cancer.  Whether you are pregnant or may be pregnant. What are the risks? Generally, this is a safe procedure. However, problems may occur, including:  Exposure to radiation. Radiation levels are very low with this test.  The results being misinterpreted.  The need for further tests.  The inability of the mammogram to detect certain cancers. What happens before the procedure?  Schedule your test about 1-2 weeks after your menstrual period if you are still menstruating. This is usually when your breasts are the least tender.  If you have had a mammogram done at a different facility in the past, get the mammogram X-rays or have them sent to your current exam facility. The new and old images will be compared.  Wash your breasts and underarms on the day of the test.  Do not wear deodorants, perfumes, lotions, or powders anywhere on your body on the day of the test.  Remove any jewelry from your neck.  Wear clothes that you can change into and out of easily. What happens during the procedure?   You  will undress from the waist up and put on a gown that opens in the front.  You will stand in front of the X-ray machine.  Each breast will be placed between two plastic or glass plates. The plates will compress your breast for a few seconds. Try to stay as relaxed as possible during the procedure. This does not cause any harm to your breasts and any discomfort you feel will be very brief.  X-rays will be taken from different angles of each breast. The procedure may vary among health care providers and hospitals. What happens after the procedure?  The mammogram will be examined by a specialist (radiologist).  You may need to repeat certain parts of the test, depending on the quality of the images. This is commonly done if the radiologist needs a better view of the breast tissue.  You may resume your normal activities.  It is up to you to get the results of your procedure. Ask your health care provider, or the department that is doing the procedure, when your results will be ready. Summary  A mammogram is a low energy X-ray of the breasts that is done to check for abnormal changes. A man may have a mammogram if he has a lump or swelling in his breast.  If you have had a mammogram done at a different facility in the past, get the mammogram X-rays or have them sent to your current exam facility in order to compare them.  Schedule your test about 1-2 weeks after your menstrual period if you are still menstruating.  For this test, each breast will be  placed between two plastic or glass plates. The plates will compress your breast for a few seconds.  Ask when your test results will be ready. Make sure you get your test results. This information is not intended to replace advice given to you by your health care provider. Make sure you discuss any questions you have with your health care provider. Document Revised: 05/24/2018 Document Reviewed: 05/24/2018 Elsevier Patient Education  [place].

## 2020-04-23 NOTE — Telephone Encounter (Signed)
She can cut it in half, then but I will not lower the dose when her blood pressure is finally at goal.  If she lowers her dose the blood pressure will go up

## 2020-04-23 NOTE — Progress Notes (Addendum)
Subjective:   Kari Lyons is a 69 y.o. female who presents for Medicare Annual (Subsequent) preventive examination.  Review of Systems    No ROS.  Medicare Wellness Virtual Visit.   Cardiac Risk Factors include: advanced age (>10mn, >>45women);diabetes mellitus;hypertension     Objective:    Today's Vitals   04/23/20 1407  Weight: 173 lb (78.5 kg)  Height: 5' 5" (1.651 m)   Body mass index is 28.79 kg/m.  Advanced Directives 04/23/2020 04/16/2019 04/13/2018 06/12/2017  Does Patient Have a Medical Advance Directive? Yes Yes Yes Yes  Type of AParamedicof AFive [place]Living will Living will;Healthcare Power of AParrottLiving will [place]at[place] will  Does patient want to make changes to medical advance directive? No - Patient declined No - Patient declined No - Patient declined No - Patient declined  Copy of [place] Chart? Yes - validated most recent copy scanned in chart (See row information) Yes - validated most recent copy scanned in chart (See row information) Yes Yes    Current Medications (verified) Outpatient Encounter Medications as of 04/23/2020  Medication Sig  . Accu-Chek Softclix Lancets lancets Use to check blood sugar once daily.  . Ascorbic Acid (VITAMIN C) 100 MG tablet Take 100 mg by mouth daily.  . B Complex-Biotin-FA (TH VITAMIN B 50/B-COMPLEX PO) Take by mouth.  . blood glucose meter kit and supplies KIT Use once daily to check fasting and post prandial blood sugars  . Blood Glucose Monitoring Suppl (ACCU-CHEK AVIVA PLUS) w/Device KIT 1 Device by Does not apply route daily.  . cetirizine (ZYRTEC) 10 MG tablet Take 10 mg by mouth daily.  . Cholecalciferol (VITAMIN D3) 10 MCG (400 UNIT) tablet Take 400 Units by mouth daily.  . Chromium 200 MCG CAPS Take 1 capsule by mouth daily.  . fluticasone (FLONASE) 50 MCG/ACT nasal spray Place into both nostrils daily.  .Marland Kitchen glucose blood (ACCU-CHEK AVIVA PLUS) test strip Use to check blood sugar once daily  . glucose blood test strip Use as instructed  . losartan (COZAAR) 100 MG tablet Take 1 tablet by mouth once daily  . mupirocin ointment (BACTROBAN) 2 % Place 1 application into the nose 2 (two) times daily.  .Marland KitchenPANTOTHENIC ACID PO Take 1 capsule by mouth daily.  . Potassium 99 MG TABS Take by mouth.  . triamcinolone cream (KENALOG) 0.1 % Apply 1 application topically 2 (two) times daily.  . vitamin A 10000 UT capsule Take 10,000 Units by mouth daily.  . Zinc 30 MG TABS Take by mouth.   No facility-administered encounter medications on file as of 04/23/2020.    Allergies (verified) Tetanus toxoids and Aloe vera   History: Past Medical History:  Diagnosis Date  . Anemia    H/O   . Arrhythmia 2008  . Arthritis   . 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 2009: Reported workup at [facility] including ECHO, Stress Test and Cardiac Cath; records N/A.  ECHO with mild LVH, mild MVP; stress test and cath reportedly normal   . Depression   . Edema   . GERD (gastroesophageal reflux disease)   . Hypertension   . IBS (irritable bowel syndrome)   . Mitral valve prolapse   . Patient is Jehovah's Witness   . Sleep apnea    MILD-NO CPAP    Past Surgical History:  Procedure Laterality Date  . ABDOMINAL HYSTERECTOMY  1985  . BILATERAL  SALPINGOOPHORECTOMY  2002   due to fh of ovarian ca  . BREAST BIOPSY Right 03/09/2016   papilloma  . BREAST LUMPECTOMY Right 06/08/2016   Procedure: BREAST LUMPECTOMY WITH ULTRASOUND IN O.R;  Surgeon: Christene Lye, MD;  Location: [facility] ORS;  Service: General;  Laterality: Right;  . BREAST SURGERY Left 02-26-13   subareloar duct excision and mass  . COLONOSCOPY  2012  . EXCISION / BIOPSY BREAST / NIPPLE / DUCT Right 1985   negative  . EXCISION / BIOPSY BREAST / NIPPLE / DUCT Left 02/26/2013   intraductal papilloma  . EXCISION / BIOPSY BREAST / NIPPLE / DUCT  Right 06/09/2016  . TONSILLECTOMY     Family History  Problem Relation Age of Onset  . Hypertension Mother   . Arthritis Mother   . Parkinson's disease Father   . Hypertension Sister   . Heart disease Daughter   . Cancer Sister 60       ovarian ca  . Cancer Brother        multiple myeloma  . Diabetes Brother   . Cancer Maternal Grandmother        colon ca  . Breast cancer Paternal Grandmother 35  . Bladder Cancer Paternal Uncle   . Prostate cancer Neg Hx   . Kidney cancer Neg Hx    Social History   Socioeconomic History  . Marital status: Married    Spouse name: Not on file  . Number of children: Not on file  . Years of education: Not on file  . Highest education level: Not on file  Occupational History  . Not on file  Tobacco Use  . Smoking status: Never Smoker  . Smokeless tobacco: Never Used  . Tobacco comment: smoked for 523month when 69yrold  Substance and Sexual Activity  . Alcohol use: No    Alcohol/week: 0.0 standard drinks  . Drug use: No  . Sexual activity: Not on file  Other Topics Concern  . Not on file  Social History Narrative  . Not on file   Social Determinants of Health   Financial Resource Strain:   . Difficulty of Paying Living Expenses:   Food Insecurity:   . Worried About RuCharity fundraisern the Last Year:   . RaArboriculturistn the Last Year:   Transportation Needs:   . LaFilm/video editorMedical):   . Marland Kitchenack of Transportation (Non-Medical):   Physical Activity: Unknown  . Days of Exercise per Week: 0 days  . Minutes of Exercise per Session: Not on file  Stress:   . Feeling of Stress :   Social Connections: Socially Integrated  . Frequency of Communication with Friends and Family: More than three times a week  . Frequency of Social Gatherings with Friends and Family: Not on file  . Attends Religious Services: 1 to 4 times per year  . Active Member of Clubs or Organizations: Yes  . Attends ClArchivisteetings: 1  to 4 times per year  . Marital Status: Married    Tobacco Counseling Counseling given: Not Answered Comment: smoked for 23m79monthhen [age]yrd   Clinical Intake:  Pre-visit preparation completed: Yes        Diabetes: Yes (Followed by pcp)  How often do you need to have someone help you when you read instructions, pamphlets, or other written materials from your doctor or pharmacy?: 1 - Never Interpreter Needed?: No      Activities of Daily  Living In your present state of health, do you have any difficulty performing the following activities: 04/23/2020  Hearing? Y  Comment L ear hearing loss  Vision? N  Difficulty concentrating or making decisions? N  Walking or climbing stairs? Y  Comment Chronic R knee pain  Dressing or bathing? N  Doing errands, shopping? N  Preparing Food and eating ? N  Using the Toilet? N  In the past six months, have you accidently leaked urine? N  Do you have problems with loss of bowel control? N  Managing your Medications? N  Managing your Finances? N  Housekeeping or managing your Housekeeping? N  Some recent data might be hidden    Patient Care Team: [name], MD as PCP - General (Internal Medicine) Christene Lye, MD (General Surgery) Albina Billet, MD (Internal Medicine)  Indicate any recent Medical Services you may have received from other than [facility] providers in the past year (date may be approximate).     Assessment:   This is a routine wellness examination for [place].  I connected with Jennie today by telephone and verified that I am speaking with the correct person using two identifiers. Location patient: home Location provider: work Persons participating in the virtual visit: patient, Marine scientist.    I discussed the limitations, risks, security and privacy concerns of performing an evaluation and management service by telephone and the availability of in person appointments. The patient expressed understanding  and verbally consented to this telephonic visit.    Interactive audio and video telecommunications were attempted between this provider and patient, however failed, due to patient having technical difficulties OR patient did not have access to video capability.  We continued and completed visit with audio only.  Some vital signs may be absent or patient reported.   Hearing/Vision screen  Hearing Screening   125Hz 250Hz 500Hz 1000Hz 2000Hz 3000Hz 4000Hz 6000Hz 8000Hz  Right ear:           Left ear:           Comments: Left ear hearing loss She does not wear any device.  Followed by Dr. Tami Ribas  Vision Screening Comments: Followed by [facility] Wears corrective lenses Visual acuity not assessed, virtual visit.  They have seen their ophthalmologist in the last 12 months.   Dietary issues and exercise activities discussed: Current Exercise Habits: Home exercise routine, Intensity: Mild  Low/No salt diet Good water intake Caffeine- 2 cups daily, black tea  Goals      Patient Stated   .  Increase physical activity (pt-stated)      I would like to walk more for exercise      Depression Screen PHQ 2/9 Scores 04/23/2020 04/16/2019 04/13/2018 05/19/2017 03/11/2016  PHQ - 2 Score 1 1 0 6 0  PHQ- 9 Score - - - 12 -    Fall Risk Fall Risk  04/23/2020 02/06/2020 08/02/2019 04/25/2019 04/16/2019  Falls in the past year? 0 0 0 0 0  Number falls in past yr: - - - 0 -  Injury with Fall? - - - 0 -  Comment - - - - -  Follow up Falls evaluation completed Falls evaluation completed Falls evaluation completed - -   Handrails in use when climbing stairs? Yes  Home free of loose throw rugs in walkways, pet beds, electrical cords, etc? Yes  Adequate lighting in your home to reduce risk of falls? Yes   ASSISTIVE DEVICES UTILIZED TO PREVENT FALLS:  Life alert? No  Use of a cane, walker or w/c? No  Grab bars in the bathroom? No  Shower chair or bench in shower? No  Elevated toilet seat or a  handicapped toilet? No   TIMED UP AND GO:  Was the test performed? No . Virtual visit.  Cognitive Function: MMSE - Mini Mental State Exam 04/13/2018 06/12/2017  Orientation to time 5 5  Orientation to Place 5 5  Registration 3 3  Attention/ Calculation 5 5  Recall 3 3  Language- name 2 objects 2 2  Language- repeat 1 1  Language- follow 3 step command 3 3  Language- read & follow direction 1 1  Write a sentence 1 1  Copy design 1 1  Total score 30 30     6CIT Screen 04/23/2020 04/16/2019  What Year? 0 points 0 points  What month? 0 points 0 points  What time? - 0 points  Count back from 20 - 0 points  Months in reverse 0 points 0 points  Repeat phrase 0 points 0 points  Total Score - 0   Immunizations Immunization History  Administered Date(s) Administered  . Fluad Quad(high Dose 65+) 08/02/2019  . Influenza,inj,Quad PF,6+ Mos 09/15/2017, 07/06/2018  . Moderna SARS-COVID-2 Vaccination 12/31/2019, 01/28/2020   Health Maintenance Health Maintenance  Topic Date Due  . MAMMOGRAM  04/11/2020  . INFLUENZA VACCINE  05/17/2020  . HEMOGLOBIN A1C  08/06/2020  . OPHTHALMOLOGY EXAM  08/15/2020  . FOOT EXAM  02/05/2021  . COLONOSCOPY  09/27/2021  . DEXA SCAN  Completed  . COVID-19 Vaccine  Completed  . Hepatitis C Screening  Completed  . TETANUS/TDAP  Discontinued  . PNA vac Low Risk Adult  Discontinued   Mammogram- ordered. Number provided to patient for scheduling with [facility] [phone].   Dental Screening: Recommended annual dental exams for proper oral hygiene.  Community Resource Referral / Chronic Care Management: CRR required this visit?  Yes - patient requests elevated toilet seat, difficulty sitting in low seating. Chronic R knee pain; soft brace worn. Referral sent to [facility].   CCM required this visit?  No    Plan:   Keep all routine maintenance appointments.   Cpe 08/14/20 @ 10:30.  I have personally reviewed and noted the following in  the patient's chart:   . Medical and social history . Use of alcohol, tobacco or illicit drugs  . Current medications and supplements . Functional ability and status . Nutritional status . Physical activity . Advanced directives . List of other physicians . Hospitalizations, surgeries, and ER visits in previous 12 months . Vitals . Screenings to include cognitive, depression, and falls . Referrals and appointments  In addition, I have reviewed and discussed with patient certain preventive protocols, quality metrics, and best practice recommendations. A written personalized care plan for preventive services as well as general preventive health recommendations were provided to patient via mail.     OBrien-Blaney, Ileana Chalupa L, LPN   04/18/4286    I have reviewed the above information and agree with above.   Deborra Medina, MD

## 2020-04-23 NOTE — Telephone Encounter (Signed)
Spoke with pt to let her know that Dr. Derrel Nip does not advise reducing the dose of losartan and why. The pt stated that she would still like to try a lower dose because she doesn't think the blood pressure medication is what is helping her blood pressure she thinks it is because she has completely stopped eating any salt.

## 2020-04-23 NOTE — Telephone Encounter (Signed)
Do not advise reducing the dose , because 130/80 is the GOAL

## 2020-04-24 NOTE — Telephone Encounter (Signed)
Spoke with pt and she is aware that she can cut the pill in half if she would like to try reducing the dose. Advised pt that she should keep a log of her bp once she has reduced the dose to see if it goes back up.

## 2020-06-10 ENCOUNTER — Telehealth: Payer: Self-pay

## 2020-06-10 NOTE — Telephone Encounter (Signed)
    MA8/25/2021 1st Attempt  Name: Kari Lyons   MRN: [id number]   DOB: 1951-08-15   AGE: 69 y.o.   GENDER: female   PCP [name], MD.   06/10/20 Spoke with patient about community resources for elevated toilet seat.  Emailed resources to patient since she was on her way out the door.  Will follow-up by 06/15/20.     Tanay Misuraca, AAS Paralegal, [place] . Embedded Care Coordination [facility]  Care Management  [address], [place] [address] [email or web address]  [phone]   [email or web address]

## 2020-06-17 NOTE — Telephone Encounter (Signed)
Thank you for the followup.

## 2020-06-17 NOTE — Telephone Encounter (Signed)
    MA9/10/2019  Name: Kari Lyons   MRN: [id number]   DOB: 04-28-1951   AGE: 69 y.o.   GENDER: female   PCP [name], MD.   06/17/20 Spoke with patient she has received the resources emailed to her and will contact them in the next few days. Patient's husband had an accident and she has not had time to call anyone. Patient has my contact information. No other resources are needed. Closing referral.    Lataisha Colan, AAS Paralegal, [place] . Embedded Care Coordination [facility]  Care Management  [address], [place] [address] [email or web address]  [phone]   [email or web address]

## 2020-07-07 ENCOUNTER — Encounter: Payer: Self-pay | Admitting: Internal Medicine

## 2020-07-11 ENCOUNTER — Other Ambulatory Visit: Payer: Self-pay | Admitting: Internal Medicine

## 2020-07-14 DIAGNOSIS — E119 Type 2 diabetes mellitus without complications: Secondary | ICD-10-CM | POA: Diagnosis not present

## 2020-07-14 DIAGNOSIS — Z833 Family history of diabetes mellitus: Secondary | ICD-10-CM | POA: Diagnosis not present

## 2020-07-14 DIAGNOSIS — G8929 Other chronic pain: Secondary | ICD-10-CM | POA: Diagnosis not present

## 2020-07-14 DIAGNOSIS — Z823 Family history of stroke: Secondary | ICD-10-CM | POA: Diagnosis not present

## 2020-07-14 DIAGNOSIS — Z8249 Family history of ischemic heart disease and other diseases of the circulatory system: Secondary | ICD-10-CM | POA: Diagnosis not present

## 2020-07-14 DIAGNOSIS — Z809 Family history of malignant neoplasm, unspecified: Secondary | ICD-10-CM | POA: Diagnosis not present

## 2020-07-14 DIAGNOSIS — M1990 Unspecified osteoarthritis, unspecified site: Secondary | ICD-10-CM | POA: Diagnosis not present

## 2020-07-14 DIAGNOSIS — I10 Essential (primary) hypertension: Secondary | ICD-10-CM | POA: Diagnosis not present

## 2020-08-04 ENCOUNTER — Telehealth: Payer: Self-pay

## 2020-08-04 DIAGNOSIS — R5383 Other fatigue: Secondary | ICD-10-CM

## 2020-08-04 DIAGNOSIS — I10 Essential (primary) hypertension: Secondary | ICD-10-CM

## 2020-08-04 DIAGNOSIS — E785 Hyperlipidemia, unspecified: Secondary | ICD-10-CM

## 2020-08-04 DIAGNOSIS — E119 Type 2 diabetes mellitus without complications: Secondary | ICD-10-CM

## 2020-08-04 DIAGNOSIS — R7989 Other specified abnormal findings of blood chemistry: Secondary | ICD-10-CM

## 2020-08-04 DIAGNOSIS — E1169 Type 2 diabetes mellitus with other specified complication: Secondary | ICD-10-CM

## 2020-08-04 NOTE — Telephone Encounter (Signed)
Pt wants to know if she needs to have a lab appointment before her physical on 08/14/20? I let her know without lab order I would need to ask before scheduling.

## 2020-08-04 NOTE — Telephone Encounter (Signed)
Pt would like to have labs done before her physical on 08/14/2020. I have ordered a1c, cmp. Cbc, tsh, lipid panel and micro albumin. Is there anything else that needs to be added?

## 2020-08-13 ENCOUNTER — Other Ambulatory Visit: Payer: Self-pay

## 2020-08-13 ENCOUNTER — Other Ambulatory Visit (INDEPENDENT_AMBULATORY_CARE_PROVIDER_SITE_OTHER): Payer: Medicare PPO

## 2020-08-13 DIAGNOSIS — R7989 Other specified abnormal findings of blood chemistry: Secondary | ICD-10-CM

## 2020-08-13 DIAGNOSIS — E785 Hyperlipidemia, unspecified: Secondary | ICD-10-CM

## 2020-08-13 DIAGNOSIS — I10 Essential (primary) hypertension: Secondary | ICD-10-CM

## 2020-08-13 DIAGNOSIS — E1169 Type 2 diabetes mellitus with other specified complication: Secondary | ICD-10-CM | POA: Diagnosis not present

## 2020-08-13 DIAGNOSIS — E119 Type 2 diabetes mellitus without complications: Secondary | ICD-10-CM | POA: Diagnosis not present

## 2020-08-13 DIAGNOSIS — R5383 Other fatigue: Secondary | ICD-10-CM

## 2020-08-13 LAB — CBC WITH DIFFERENTIAL/PLATELET
Basophils Absolute: 0 10*3/uL (ref 0.0–0.1)
Basophils Relative: 0.3 % (ref 0.0–3.0)
Eosinophils Absolute: 0.1 10*3/uL (ref 0.0–0.7)
Eosinophils Relative: 2.3 % (ref 0.0–5.0)
HCT: 36.1 % (ref 36.0–46.0)
Hemoglobin: 11.9 g/dL — ABNORMAL LOW (ref 12.0–15.0)
Lymphocytes Relative: 47.2 % — ABNORMAL HIGH (ref 12.0–46.0)
Lymphs Abs: 1.7 10*3/uL (ref 0.7–4.0)
MCHC: 32.8 g/dL (ref 30.0–36.0)
MCV: 87.2 fl (ref 78.0–100.0)
Monocytes Absolute: 0.4 10*3/uL (ref 0.1–1.0)
Monocytes Relative: 10.3 % (ref 3.0–12.0)
Neutro Abs: 1.5 10*3/uL (ref 1.4–7.7)
Neutrophils Relative %: 39.9 % — ABNORMAL LOW (ref 43.0–77.0)
Platelets: 189 10*3/uL (ref 150.0–400.0)
RBC: 4.14 Mil/uL (ref 3.87–5.11)
RDW: 13.8 % (ref 11.5–15.5)
WBC: 3.6 10*3/uL — ABNORMAL LOW (ref 4.0–10.5)

## 2020-08-13 LAB — COMPREHENSIVE METABOLIC PANEL
ALT: 18 U/L (ref 0–35)
AST: 23 U/L (ref 0–37)
Albumin: 3.9 g/dL (ref 3.5–5.2)
Alkaline Phosphatase: 66 U/L (ref 39–117)
BUN: 12 mg/dL (ref 6–23)
CO2: 31 mEq/L (ref 19–32)
Calcium: 9 mg/dL (ref 8.4–10.5)
Chloride: 104 mEq/L (ref 96–112)
Creatinine, Ser: 0.86 mg/dL (ref 0.40–1.20)
GFR: 68.52 mL/min (ref >60.00)
Glucose, Bld: 82 mg/dL (ref 70–99)
Potassium: 4.3 mEq/L (ref 3.5–5.1)
Sodium: 140 mEq/L (ref 135–145)
Total Bilirubin: 0.6 mg/dL (ref 0.2–1.2)
Total Protein: 6 g/dL (ref 6.0–8.3)

## 2020-08-13 LAB — TSH: TSH: 3.28 u[IU]/mL (ref 0.35–4.50)

## 2020-08-13 LAB — LIPID PANEL
Cholesterol: 142 mg/dL (ref 0–200)
HDL: 59.8 mg/dL (ref >39.00)
LDL Cholesterol: 70 mg/dL (ref 0–99)
NonHDL: 82.1
Total CHOL/HDL Ratio: 2
Triglycerides: 63 mg/dL (ref 0.0–149.0)
VLDL: 12.6 mg/dL (ref 0.0–40.0)

## 2020-08-13 LAB — HEMOGLOBIN A1C: Hgb A1c MFr Bld: 6.2 % (ref 4.6–6.5)

## 2020-08-13 NOTE — Addendum Note (Signed)
Addended by: Leeanne Rio on: 08/13/2020 08:54 AM   Modules accepted: Orders

## 2020-08-14 ENCOUNTER — Encounter: Payer: Self-pay | Admitting: Internal Medicine

## 2020-08-14 ENCOUNTER — Other Ambulatory Visit: Payer: Self-pay

## 2020-08-14 ENCOUNTER — Ambulatory Visit (INDEPENDENT_AMBULATORY_CARE_PROVIDER_SITE_OTHER): Payer: Medicare PPO | Admitting: Internal Medicine

## 2020-08-14 VITALS — BP 142/76 | HR 68 | Ht 65.0 in | Wt 167.2 lb

## 2020-08-14 DIAGNOSIS — Z1211 Encounter for screening for malignant neoplasm of colon: Secondary | ICD-10-CM | POA: Diagnosis not present

## 2020-08-14 DIAGNOSIS — Z23 Encounter for immunization: Secondary | ICD-10-CM

## 2020-08-14 DIAGNOSIS — H9042 Sensorineural hearing loss, unilateral, left ear, with unrestricted hearing on the contralateral side: Secondary | ICD-10-CM

## 2020-08-14 DIAGNOSIS — D709 Neutropenia, unspecified: Secondary | ICD-10-CM

## 2020-08-14 DIAGNOSIS — E785 Hyperlipidemia, unspecified: Secondary | ICD-10-CM | POA: Diagnosis not present

## 2020-08-14 DIAGNOSIS — Z0000 Encounter for general adult medical examination without abnormal findings: Secondary | ICD-10-CM

## 2020-08-14 DIAGNOSIS — E1169 Type 2 diabetes mellitus with other specified complication: Secondary | ICD-10-CM

## 2020-08-14 DIAGNOSIS — I10 Essential (primary) hypertension: Secondary | ICD-10-CM

## 2020-08-14 DIAGNOSIS — E119 Type 2 diabetes mellitus without complications: Secondary | ICD-10-CM

## 2020-08-14 LAB — MICROALBUMIN / CREATININE URINE RATIO
Creatinine,U: 67.8 mg/dL
Microalb Creat Ratio: 1 mg/g (ref 0.0–30.0)
Microalb, Ur: <0.7 mg/dL (ref 0.0–1.9)

## 2020-08-14 MED ORDER — TRIAMCINOLONE ACETONIDE 0.1 % EX CREA: 1.0000 "application " | TOPICAL_CREAM | Freq: Two times a day (BID) | CUTANEOUS | 0 refills | Status: DC

## 2020-08-14 MED ORDER — MUPIROCIN 2 % EX OINT: 1.0000 "application " | TOPICAL_OINTMENT | Freq: Two times a day (BID) | CUTANEOUS | 0 refills | Status: DC

## 2020-08-14 NOTE — Patient Instructions (Addendum)
Your annual mammogram has been ordered.  You are encouraged (required) to call to make your appointment at [facility]  Your flu vaccine and Pneumovax were given today  Your diabetes remains under excellent control  And your cholesterol and other labs are also normal. Please continue your current medications.  Referral for colonoscopy has been done   Return in 6 months    Health Maintenance After Age 70 After age 80, you are at a higher risk for certain long-term diseases and infections as well as injuries from falls. Falls are a major cause of broken bones and head injuries in people who are older than age 78. Getting regular preventive care can help to keep you healthy and well. Preventive care includes getting regular testing and making lifestyle changes as recommended by your health care provider. Talk with your health care provider about:  Which screenings and tests you should have. A screening is a test that checks for a disease when you have no symptoms.  A diet and exercise plan that is right for you. What should I know about screenings and tests to prevent falls? Screening and testing are the best ways to find a health problem early. Early diagnosis and treatment give you the best chance of managing medical conditions that are common after age 74. Certain conditions and lifestyle choices may make you more likely to have a fall. Your health care provider may recommend:  Regular vision checks. Poor vision and conditions such as cataracts can make you more likely to have a fall. If you wear glasses, make sure to get your prescription updated if your vision changes.  Medicine review. Work with your health care provider to regularly review all of the medicines you are taking, including over-the-counter medicines. Ask your health care provider about any side effects that may make you more likely to have a fall. Tell your health care provider if any medicines that you take make  you feel dizzy or sleepy.  Osteoporosis screening. Osteoporosis is a condition that causes the bones to get weaker. This can make the bones weak and cause them to break more easily.  Blood pressure screening. Blood pressure changes and medicines to control blood pressure can make you feel dizzy.  Strength and balance checks. Your health care provider may recommend certain tests to check your strength and balance while standing, walking, or changing positions.  Foot health exam. Foot pain and numbness, as well as not wearing proper footwear, can make you more likely to have a fall.  Depression screening. You may be more likely to have a fall if you have a fear of falling, feel emotionally low, or feel unable to do activities that you used to do.  Alcohol use screening. Using too much alcohol can affect your balance and may make you more likely to have a fall. What actions can I take to lower my risk of falls? General instructions  Talk with your health care provider about your risks for falling. Tell your health care provider if: ? You fall. Be sure to tell your health care provider about all falls, even ones that seem minor. ? You feel dizzy, sleepy, or off-balance.  Take over-the-counter and prescription medicines only as told by your health care provider. These include any supplements.  Eat a healthy diet and maintain a healthy weight. A healthy diet includes low-fat dairy products, low-fat (lean) meats, and fiber from whole grains, beans, and lots of fruits and vegetables. Home safety  Remove any tripping hazards, such as rugs, cords, and clutter.  Install safety equipment such as grab bars in bathrooms and safety rails on stairs.  Keep rooms and walkways well-lit. Activity   Follow a regular exercise program to stay fit. This will help you maintain your balance. Ask your health care provider what types of exercise are appropriate for you.  If you need a cane or walker, use it  as recommended by your health care provider.  Wear supportive shoes that have nonskid soles. Lifestyle  Do not drink alcohol if your health care provider tells you not to drink.  If you drink alcohol, limit how much you have: ? 0-1 drink a day for women. ? 0-2 drinks a day for men.  Be aware of how much alcohol is in your drink. In the [place] one drink equals one typical bottle of beer (12 oz), one-half glass of wine (5 oz), or one shot of hard liquor (1 oz).  Do not use any products that contain nicotine or tobacco, such as cigarettes and e-cigarettes. If you need help quitting, ask your health care provider. Summary  Having a healthy lifestyle and getting preventive care can help to protect your health and wellness after age 77.  Screening and testing are the best way to find a health problem early and help you avoid having a fall. Early diagnosis and treatment give you the best chance for managing medical conditions that are more common for people who are older than age 86.  Falls are a major cause of broken bones and head injuries in people who are older than age 24. Take precautions to prevent a fall at home.  Work with your health care provider to learn what changes you can make to improve your health and wellness and to prevent falls. This information is not intended to replace advice given to you by your health care provider. Make sure you discuss any questions you have with your health care provider. Document Revised: 01/24/2019 Document Reviewed: 08/16/2017 Elsevier Patient Education  2020 [employer].

## 2020-08-14 NOTE — Progress Notes (Signed)
Patient ID: Kari Lyons, female    DOB: [birth year]  Age: 69 y.o. MRN: [id number]  The patient is here for annual wellness examination and management of other chronic and acute problems.   The risk factors are reflected in the social history.  The roster of all physicians providing medical care to patient - is listed in the Snapshot section of the chart.  Activities of daily living:  The patient is 100% independent in all ADLs: dressing, toileting, feeding as well as independent mobility  Home safety : The patient has smoke detectors in the home. They wear seatbelts.  There are no firearms at home. There is no violence in the home.   There is no risks for hepatitis, STDs or HIV. There is no   history of blood transfusion. They have no travel history to infectious disease endemic areas of the world.  The patient has seen their dentist in the last six month. They have seen their eye doctor in the last year. They admit to slight hearing difficulty with regard to whispered voices and some television programs.  They have deferred audiologic testing in the last year.  They do not  have excessive sun exposure. Discussed the need for sun protection: hats, long sleeves and use of sunscreen if there is significant sun exposure.   Diet: the importance of a healthy diet is discussed. They do have a healthy diet.  The benefits of regular aerobic exercise were discussed. She walks 4 times per week ,  20 minutes.   Depression screen: there are no signs or vegative symptoms of depression- irritability, change in appetite, anhedonia, sadness/tearfullness.  Cognitive assessment: the patient manages all their financial and personal affairs and is actively engaged. They could relate day,date,year and events; recalled 2/3 objects at 3 minutes; performed clock-face test normally.  The following portions of the patient's history were reviewed and updated as appropriate: allergies, current medications, past family  history, past medical history,  past surgical history, past social history  and problem list.  Visual acuity was not assessed per patient preference since she has regular follow up with her ophthalmologist. Hearing and body mass index were assessed and reviewed.   During the course of the visit the patient was educated and counseled about appropriate screening and preventive services including : fall prevention , diabetes screening, nutrition counseling, colorectal cancer screening, and recommended immunizations.    CC: The primary encounter diagnosis was Colon cancer screening. Diagnoses of Type 2 diabetes mellitus without complication, without long-term current use of insulin ([place]), Hyperlipidemia associated with type 2 diabetes mellitus ([place]), Neutropenia, unspecified type ([place]), Need for vaccination, Need for immunization against influenza, Sensorineural hearing loss (SNHL) of left ear with unrestricted hearing of right ear, Essential hypertension, and Encounter for preventive health examination were also pertinent to this visit.   1) HTN:  Hypertension: patient checks blood pressure twice weekly at home.  Readings have been for the most part  130/70 or lower  at rest . Patient is following a reduce salt diet most days and is taking medications as prescribed  2) Reviewed hearing loss in left ear that occurred during a trip to [place].  The hearing loss occurred after intense pain in the ear.  Was seen by a doctor in [place]  Who prescribed nothing. Diagnosis made  was unclear, but history was suggestive of barotrauma occurring during the flight. .  She was  not seen by ENT in the states until a month after she returned, and  unfortunately the hearing loss permanent,  The effect on her life  has been significant.  She gets easily startled bu sudden noises or voices.   3) Reviewed recent labs.  CBC discussed at length due to patient's concerns about a low WBC.    4)  T2DM:  She  feels generally well,   But is not  exercising regularly or trying to lose weight. Checking  blood sugars less than once daily at variable times, usually only if she feels she may be having a hypoglycemic event. .  BS have been under 130 fasting and < 150 post prandially.  Denies any recent hypoglyemic events.  Taking   medications as directed. Following a carbohydrate modified diet 6 days per week. Denies numbness, burning and tingling of extremities. Appetite is good.    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   She has a past surgical history that includes Bilateral salpingoophorectomy (2002); Abdominal hysterectomy (1985); Colonoscopy (2012); Tonsillectomy; Breast surgery (Left, 02-26-13); Breast lumpectomy (Right, 06/08/2016); Excision / biopsy breast / nipple / duct (Right, 1985); Excision / biopsy breast / nipple / duct (Left, 02/26/2013); Breast biopsy (Right, 03/09/2016); and Excision / biopsy breast / nipple / duct (Right, 06/09/2016).   Her family history includes Arthritis in her mother; Bladder Cancer in her paternal uncle; Breast cancer (age of onset: 59) in her paternal grandmother; Cancer in her brother and maternal grandmother; Cancer (age of onset: 29) in her sister; Diabetes in her brother; Heart disease in her daughter; Hypertension in her mother and sister; Parkinson's disease in her father.She reports that she has never smoked. She has never used smokeless tobacco. She reports that she does not drink alcohol and does not use drugs.  Outpatient Medications Prior to Visit  Medication Sig Dispense Refill  . Accu-Chek Softclix Lancets lancets Use to check blood sugar once daily. 100 each 12  . Ascorbic Acid (VITAMIN C) 100 MG tablet Take 100 mg by mouth  daily.    . B Complex-Biotin-FA (TH VITAMIN B 50/B-COMPLEX PO) Take by mouth.    . blood glucose meter kit and supplies KIT Use once daily to check fasting and post prandial blood sugars 1 each 0  . Blood Glucose Monitoring Suppl (ACCU-CHEK AVIVA PLUS) w/Device KIT 1 Device by Does not apply route daily. 1 kit 0  . cetirizine (ZYRTEC) 10 MG tablet Take 10 mg by mouth daily.    . Cholecalciferol (VITAMIN D3) 10 MCG (400 UNIT) tablet Take 400 Units by mouth daily.    . Chromium 200 MCG CAPS Take 1 capsule by mouth daily.    Marland Kitchen glucose blood (ACCU-CHEK AVIVA PLUS) test strip Use to check blood sugar once daily 100 each 12  . glucose blood test strip Use as instructed 100 each 2  . losartan (COZAAR) 100 MG tablet Take 1 tablet by mouth once daily 90 tablet 0  . mupirocin ointment (BACTROBAN) 2 % Place 1 application into the nose 2 (two) times daily. 22 g 0  . PANTOTHENIC ACID PO Take 1 capsule by mouth daily.    . Potassium 99 MG TABS Take by mouth.    . triamcinolone cream (KENALOG) 0.1 % Apply 1 application topically 2 (two) times daily. 30 g 0  . vitamin A 10000 UT capsule Take 10,000 Units by mouth daily.    Marland Kitchen  Zinc 30 MG TABS Take by mouth.    . fluticasone (FLONASE) 50 MCG/ACT nasal spray Place into both nostrils daily. (Patient not taking: Reported on 08/14/2020)     No facility-administered medications prior to visit.    Review of Systems   Patient denies headache, fevers, malaise, unintentional weight loss, skin rash, eye pain, sinus congestion and sinus pain, sore throat, dysphagia,  hemoptysis , cough, dyspnea, wheezing, chest pain, palpitations, orthopnea, edema, abdominal pain, nausea, melena, diarrhea, constipation, flank pain, dysuria, hematuria, urinary  Frequency, nocturia, numbness, tingling, seizures,  Focal weakness, Loss of consciousness,  Tremor, insomnia, depression, anxiety, and suicidal ideation.      Objective:  BP (!) 142/76   Pulse 68   Ht _0  (1.651 m)   Wt 167  lb 3.2 oz (75.8 kg)   SpO2 99%   BMI 27.82 kg/m   Physical Exam  General appearance: alert, cooperative and appears stated age Head: Normocephalic, without obvious abnormality, atraumatic Eyes: conjunctivae/corneas clear. PERRL, EOM's intact. Fundi benign. Ears: normal TM's and external ear canals both ears Nose: Nares normal. Septum midline. Mucosa normal. No drainage or sinus tenderness. Throat: lips, mucosa, and tongue normal; teeth and gums normal Neck: no adenopathy, no carotid bruit, no JVD, supple, symmetrical, trachea midline and thyroid not enlarged, symmetric, no tenderness/mass/nodules Lungs: clear to auscultation bilaterally Breasts: normal appearance, no masses or tenderness Heart: regular rate and rhythm, S1, S2 normal, no murmur, click, rub or gallop Abdomen: soft, non-tender; bowel sounds normal; no masses,  no organomegaly Extremities: extremities normal, atraumatic, no cyanosis or edema Pulses: 2+ and symmetric Skin: Skin color, texture, turgor normal. No rashes or lesions Neurologic: Alert and oriented X 3, normal strength and tone. Normal symmetric reflexes. Normal coordination and gait.    Assessment & Plan:   Problem List Items Addressed This Visit      Unprioritized   Essential hypertension    Well controlled on current regimen of losartan 100 mg daily .Renal function is stable, no changes today.  Lab Results  Component Value Date   CREATININE 0.86 08/13/2020   Lab Results  Component Value Date   NA 140 08/13/2020   K 4.3 08/13/2020   CL 104 08/13/2020   CO2 31 08/13/2020         Encounter for preventive health examination    age appropriate education and counseling updated, referrals for preventative services and immunizations addressed, dietary and smoking counseling addressed, most recent labs reviewed.  I have personally reviewed and have noted:  1) the patient's medical and social history 2) The pt's use of alcohol, tobacco, and illicit  drugs 3) The patient's current medications and supplements 4) Functional ability including ADL's, fall risk, home safety risk, hearing and visual impairment 5) Diet and physical activities 6) Evidence for depression or mood disorder 7) The patient's height, weight, and BMI have been recorded in the chart  I have made referrals, and provided counseling and education based on review of the above      Type 2 diabetes mellitus without complication, without long-term current use of insulin ([place])     Remains.  well-controlled on diet alone .  hemoglobin A1c has been consistently at or  less than 7.0 . Patient is up-to-date on eye exams and foot exam is normal today. Patient has no microalbuminuria. Patient has deferred statin therapy for CAD risk reduction and on ACE/ARB for renal protection and hypertension    Lab Results  Component Value Date   HGBA1C 6.2  08/13/2020   Lab Results  Component Value Date   LABMICR See below: 10/30/2015   LABMICR See below: 10/13/2015   MICROALBUR <0.7 08/14/2020   MICROALBUR <0.7 02/05/2020    Lab Results  Component Value Date   CHOL 142 08/13/2020   HDL 59.80 08/13/2020   LDLCALC 70 08/13/2020   TRIG 63.0 08/13/2020   CHOLHDL 2 08/13/2020           Relevant Orders   Hemoglobin A1c   Comprehensive metabolic panel   Sensorineural hearing loss (SNHL) of left ear    Occurred in July 2019, after a trip to [place].  She remains quite frustrated with the negative impact on her life.  Hearing aids were offered but too costly      Hyperlipidemia associated with type 2 diabetes mellitus ([place])    Using the Framingham risk calculator,  her 10 year risk of coronary artery disease is 15%.. I have  recommended statin therapy but she has deferred for now.   Lab Results  Component Value Date   CHOL 142 08/13/2020   HDL 59.80 08/13/2020   LDLCALC 70 08/13/2020   TRIG 63.0 08/13/2020   CHOLHDL 2 08/13/2020          Other Visit Diagnoses    Colon  cancer screening    -  Primary   Relevant Orders   Ambulatory referral to Gastroenterology   Neutropenia, unspecified type ([place])       Relevant Orders   CBC with Differential/Platelet   Need for vaccination       Relevant Orders   Pneumococcal polysaccharide vaccine 23-valent greater than or equal to 2yo subcutaneous/IM (Completed)   Need for immunization against influenza       Relevant Orders   Flu Vaccine QUAD High Dose(Fluad) (Completed)      I have discontinued Shaunna M. Woody's fluticasone. I am also having her maintain her vitamin C, cetirizine, Chromium, PANTOTHENIC ACID PO, glucose blood, Vitamin D3, vitamin A, Potassium, Zinc, B Complex-Biotin-FA (TH VITAMIN B 50/B-COMPLEX PO), blood glucose meter kit and supplies, Accu-Chek Softclix Lancets, Accu-Chek Aviva Plus, Accu-Chek Aviva Plus, losartan, triamcinolone cream, and mupirocin ointment.  No orders of the defined types were placed in this encounter.   Medications Discontinued During This Encounter  Medication Reason  . fluticasone (FLONASE) 50 MCG/ACT nasal spray Patient has not taken in last 30 days    Follow-up: No follow-ups on file.   [name], MD

## 2020-08-16 NOTE — Assessment & Plan Note (Addendum)
Using the Framingham risk calculator,  her 10 year risk of coronary artery disease is 15%.. I have  recommended statin therapy but she has deferred for now.   Lab Results  Component Value Date   CHOL 142 08/13/2020   HDL 59.80 08/13/2020   LDLCALC 70 08/13/2020   TRIG 63.0 08/13/2020   CHOLHDL 2 08/13/2020

## 2020-08-16 NOTE — Assessment & Plan Note (Signed)
Remains.  well-controlled on diet alone .  hemoglobin A1c has been consistently at or  less than 7.0 . Patient is up-to-date on eye exams and foot exam is normal today. Patient has no microalbuminuria. Patient has deferred statin therapy for CAD risk reduction and on ACE/ARB for renal protection and hypertension    Lab Results  Component Value Date   HGBA1C 6.2 08/13/2020   Lab Results  Component Value Date   LABMICR See below: 10/30/2015   LABMICR See below: 10/13/2015   MICROALBUR <0.7 08/14/2020   MICROALBUR <0.7 02/05/2020    Lab Results  Component Value Date   CHOL 142 08/13/2020   HDL 59.80 08/13/2020   LDLCALC 70 08/13/2020   TRIG 63.0 08/13/2020   CHOLHDL 2 08/13/2020

## 2020-08-16 NOTE — Assessment & Plan Note (Signed)
Well controlled on current regimen of losartan 100 mg daily .Renal function is stable, no changes today.  Lab Results  Component Value Date   CREATININE 0.86 08/13/2020   Lab Results  Component Value Date   NA 140 08/13/2020   K 4.3 08/13/2020   CL 104 08/13/2020   CO2 31 08/13/2020

## 2020-08-16 NOTE — Assessment & Plan Note (Signed)
Occurred in July 2019, after a trip to [place].  She remains quite frustrated with the negative impact on her life.  Hearing aids were offered but too costly

## 2020-08-16 NOTE — Assessment & Plan Note (Signed)

## 2020-08-19 DIAGNOSIS — H2513 Age-related nuclear cataract, bilateral: Secondary | ICD-10-CM | POA: Diagnosis not present

## 2020-08-19 LAB — HM DIABETES EYE EXAM

## 2020-09-28 ENCOUNTER — Other Ambulatory Visit: Payer: Self-pay

## 2020-09-28 ENCOUNTER — Ambulatory Visit
Admission: RE | Admit: 2020-09-28 | Discharge: 2020-09-28 | Disposition: A | Discharge status: Home / Self Care | Payer: Medicare PPO | Source: Ambulatory Visit | Attending: Internal Medicine | Admitting: Internal Medicine

## 2020-09-28 DIAGNOSIS — Z1231 Encounter for screening mammogram for malignant neoplasm of breast: Secondary | ICD-10-CM | POA: Insufficient documentation

## 2020-12-05 ENCOUNTER — Other Ambulatory Visit: Payer: Self-pay | Admitting: Internal Medicine

## 2021-01-21 DIAGNOSIS — K581 Irritable bowel syndrome with constipation: Secondary | ICD-10-CM | POA: Diagnosis not present

## 2021-01-21 DIAGNOSIS — Z1211 Encounter for screening for malignant neoplasm of colon: Secondary | ICD-10-CM | POA: Diagnosis not present

## 2021-01-21 DIAGNOSIS — K219 Gastro-esophageal reflux disease without esophagitis: Secondary | ICD-10-CM | POA: Diagnosis not present

## 2021-02-11 ENCOUNTER — Telehealth: Payer: Self-pay | Admitting: Internal Medicine

## 2021-02-11 ENCOUNTER — Other Ambulatory Visit: Payer: Self-pay | Admitting: Internal Medicine

## 2021-02-11 MED ORDER — BLOOD GLUCOSE METER KIT: PACK | 0 refills | Status: DC

## 2021-02-11 MED ORDER — ACCU-CHEK AVIVA PLUS W/DEVICE KIT
1.0000 | PACK | Freq: Every day | 0 refills | Status: DC
Start: 2021-02-11 — End: 2023-05-23

## 2021-02-11 NOTE — Addendum Note (Signed)
Addended byElpidio Galea T on: 02/11/2021 01:07 PM   Modules accepted: Orders

## 2021-02-11 NOTE — Telephone Encounter (Signed)
Pharmacy called in for refill for patient Blood Glucose Monitoring Suppl (ACCU-CHEK AVIVA PLUS) w/Device KIT

## 2021-02-11 NOTE — Telephone Encounter (Signed)
Pharmacist Amanda from [facility] called wanting to see if she could switch the Blood Glucose Monitoring Suppl (ACCU-CHEK AVIVA PLUS) w/Device KIT to the Accu Chek guide as the other one is no longer made anymore. She can be reach at [phone] by hitting 0 for Pharmacy. 

## 2021-02-12 ENCOUNTER — Other Ambulatory Visit: Payer: Self-pay

## 2021-02-12 ENCOUNTER — Encounter: Payer: Self-pay | Admitting: Internal Medicine

## 2021-02-12 ENCOUNTER — Ambulatory Visit: Payer: Medicare PPO | Admitting: Internal Medicine

## 2021-02-12 VITALS — BP 144/84 | HR 77 | Temp 96.4°F | Resp 16 | Ht 65.0 in | Wt 173.0 lb

## 2021-02-12 DIAGNOSIS — H9042 Sensorineural hearing loss, unilateral, left ear, with unrestricted hearing on the contralateral side: Secondary | ICD-10-CM

## 2021-02-12 DIAGNOSIS — I10 Essential (primary) hypertension: Secondary | ICD-10-CM

## 2021-02-12 DIAGNOSIS — H6992 Unspecified Eustachian tube disorder, left ear: Secondary | ICD-10-CM

## 2021-02-12 DIAGNOSIS — H6982 Other specified disorders of Eustachian tube, left ear: Secondary | ICD-10-CM

## 2021-02-12 DIAGNOSIS — E119 Type 2 diabetes mellitus without complications: Secondary | ICD-10-CM

## 2021-02-12 LAB — COMPREHENSIVE METABOLIC PANEL
ALT: 18 U/L (ref 0–35)
AST: 27 U/L (ref 0–37)
Albumin: 4.3 g/dL (ref 3.5–5.2)
Alkaline Phosphatase: 73 U/L (ref 39–117)
BUN: 9 mg/dL (ref 6–23)
CO2: 30 mEq/L (ref 19–32)
Calcium: 9.3 mg/dL (ref 8.4–10.5)
Chloride: 104 mEq/L (ref 96–112)
Creatinine, Ser: 0.74 mg/dL (ref 0.40–1.20)
GFR: 81.78 mL/min (ref >60.00)
Glucose, Bld: 90 mg/dL (ref 70–99)
Potassium: 4 mEq/L (ref 3.5–5.1)
Sodium: 140 mEq/L (ref 135–145)
Total Bilirubin: 0.8 mg/dL (ref 0.2–1.2)
Total Protein: 6.7 g/dL (ref 6.0–8.3)

## 2021-02-12 LAB — HEMOGLOBIN A1C: Hgb A1c MFr Bld: 5.9 % (ref 4.6–6.5)

## 2021-02-12 MED ORDER — HYDROCHLOROTHIAZIDE 12.5 MG PO CAPS: 12.5000 mg | ORAL_CAPSULE | Freq: Every day | ORAL | 5 refills | Status: DC

## 2021-02-12 MED ORDER — ACCU-CHEK AVIVA PLUS VI STRP: ORAL_STRIP | 12 refills | Status: DC

## 2021-02-12 MED ORDER — ZOSTER VAC RECOMB ADJUVANTED 50 MCG/0.5ML IM SUSR: 0.5000 mL | Freq: Once | INTRAMUSCULAR | 1 refills | Status: AC

## 2021-02-12 NOTE — Patient Instructions (Signed)
Your blood pressure is a little too high.  I am adding 12.5 mg of HCTZ.  You may delay starting it until after your EGD  Once you start it, please return after one week for a potassium check   The ShingRx vaccine is now available in local pharmacies and is much more protective than the old one  Zostavax  (it is about 97%  Effective in preventing shingles). .   It is therefore ADVISED for all interested adults over 50 to prevent shingles so I have printed you a prescription for it.  (it requires a 2nd dose 2 to 6 months after the first one) .  It will cause you to have flu  like symptoms for 2 days    Diabetes Mellitus and Standards of [place] with and managing diabetes (diabetes mellitus) can be complicated. Your diabetes treatment may be managed by a team of health care providers, including:  A physician who specializes in diabetes (endocrinologist). You might also have visits with a nurse practitioner or physician assistant.  Nurses.  A registered dietitian.  A certified diabetes care and education specialist.  An exercise specialist.  A pharmacist.  An eye doctor.  A foot specialist (podiatrist).  A dental care provider.  A primary care provider.  A mental health care provider. How to manage your diabetes You can do many things to successfully manage your diabetes. Your health care providers will follow guidelines to help you get the best quality of care. Here are general guidelines for your diabetes management plan. Your health care providers may give you more specific instructions. Physical exams When you are diagnosed with diabetes, and each year after that, your health care provider will ask about your medical and family history. You will have a physical exam, which may include:  Measuring your height, weight, and body mass index (BMI).  Checking your blood pressure. This will be done at every routine medical visit. Your target blood pressure may vary  depending on your medical conditions, your age, and other factors.  A thyroid exam.  A skin exam.  Screening for nerve damage (peripheral neuropathy). This may include checking the pulse in your legs and feet and the level of sensation in your hands and feet.  A foot exam to inspect the structure and skin of your feet, including checking for cuts, bruises, redness, blisters, sores, or other problems.  Screening for blood vessel (vascular) problems. This may include checking the pulse in your legs and feet and checking your temperature. Blood tests Depending on your treatment plan and your personal needs, you may have the following tests:  Hemoglobin A1C (HbA1C). This test provides information about blood sugar (glucose) control over the previous 2-3 months. It is used to adjust your treatment plan, if needed. This test will be done: ? At least 2 times a year, if you are meeting your treatment goals. ? 4 times a year, if you are not meeting your treatment goals or if your goals have changed.  Lipid testing, including total cholesterol, LDL and HDL cholesterol, and triglyceride levels. ? The goal for LDL is less than 100 mg/dL (5.5 mmol/L). If you are at high risk for complications, the goal is less than 70 mg/dL (3.9 mmol/L). ? The goal for HDL is 40 mg/dL (2.2 mmol/L) or higher for men, and 50 mg/dL (2.8 mmol/L) or higher for women. An HDL cholesterol of 60 mg/dL (3.3 mmol/L) or higher gives some protection against heart disease. ? The goal  for triglycerides is less than 150 mg/dL (8.3 mmol/L).  Liver function tests.  Kidney function tests.  Thyroid function tests.   Dental and eye exams  Visit your dentist two times a year.  If you have type 1 diabetes, your health care provider may recommend an eye exam within 5 years after you are diagnosed, and then once a year after your first exam. ? For children with type 1 diabetes, the health care provider may recommend an eye exam when your  child is age 61 or older and has had diabetes for 3-5 years. After the first exam, your child should get an eye exam once a year.  If you have type 2 diabetes, your health care provider may recommend an eye exam as soon as you are diagnosed, and then every 1-2 years after your first exam.   Immunizations  A yearly flu (influenza) vaccine is recommended annually for everyone 6 months or older. This is especially important if you have diabetes.  The pneumonia (pneumococcal) vaccine is recommended for everyone 2 years or older who has diabetes. If you are age 16 or older, you may get the pneumonia vaccine as a series of two separate shots.  The hepatitis B vaccine is recommended for adults shortly after being diagnosed with diabetes. Adults and children with diabetes should receive all other vaccines according to age-specific recommendations from the Centers for Disease Control and Prevention (CDC). Mental and emotional health Screening for symptoms of eating disorders, anxiety, and depression is recommended at the time of diagnosis and after as needed. If your screening shows that you have symptoms, you may need more evaluation. You may work with a mental health care provider. Follow these instructions at home: Treatment plan You will monitor your blood glucose levels and may give yourself insulin. Your treatment plan will be reviewed at every medical visit. You and your health care provider will discuss:  How you are taking your medicines, including insulin.  Any side effects you have.  Your blood glucose level target goals.  How often you monitor your blood glucose level.  Lifestyle habits, such as activity level and tobacco, alcohol, and substance use. Education Your health care provider will assess how well you are monitoring your blood glucose levels and whether you are taking your insulin and medicines correctly. He or she may refer you to:  A certified diabetes care and education  specialist to manage your diabetes throughout your life, starting at diagnosis.  A registered dietitian who can create and review your personal nutrition plan.  An exercise specialist who can discuss your activity level and exercise plan. General instructions  Take over-the-counter and prescription medicines only as told by your health care provider.  Keep all follow-up visits. This is important. Where to find support There are many diabetes support networks, including:  American Diabetes Association (ADA): diabetes.org  Defeat Diabetes Foundation: defeatdiabetes.org Where to find more information  American Diabetes Association (ADA): www.diabetes.org  Association of Diabetes Care & Education Specialists (ADCES): diabeteseducator.org  International Diabetes Federation (IDF): https://www.munoz-bell.org/ Summary  Managing diabetes (diabetes mellitus) can be complicated. Your diabetes treatment may be managed by a team of health care providers.  Your health care providers follow guidelines to help you get the best quality care.  You should have physical exams, blood tests, blood pressure monitoring, immunizations, and screening tests regularly. Stay updated on how to manage your diabetes.  Your health care providers may also give you more specific instructions based on your individual health.  This information is not intended to replace advice given to you by your health care provider. Make sure you discuss any questions you have with your health care provider. Document Revised: 04/09/2020 Document Reviewed: 04/09/2020 Elsevier Patient Education  [place].

## 2021-02-12 NOTE — Progress Notes (Signed)
Subjective:  Patient ID: Kari Lyons, female    DOB: 04-06-1951  Age: 70 y.o. MRN: [id number]  CC: The primary encounter diagnosis was Type 2 diabetes mellitus without complication, without long-term current use of insulin ([place]). Diagnoses of Sensorineural hearing loss (SNHL) of left ear with unrestricted hearing of right ear, Essential hypertension, and Eustachian tube dysfunction, left were also pertinent to this visit.  HPI Kari Lyons presents for follow up on type 2 DM with hypertension and hyperlipidemia  This visit occurred during the SARS-CoV-2 public health emergency.  Safety protocols were in place, including screening questions prior to the visit, additional usage of staff PPE, and extensive cleaning of exam room while observing appropriate contact time as indicated for disinfecting solutions.   1) SNHL: I'M miserable" since her episode of sudden onset of hearing loss and bilateral ear pain that occurred during a flight to [place] In  August 2019 .  Original evaluation was with Margaretha Sheffield , but she states she she has followed up with Dr. Tami Ribas  since the initial visit  but has had no improvement in hearing or the feeling of fullness in the left ear.   She is using Flonase regularly and flushing sinuses with saline rinses to manage mucus. Has not had a 2nd opinion but doesn't want to consider  Surgery unless there are no other options .  Discussed referral to [facility] .  OV noted from August 2019 reviewed : viral neuronitis ,  Eustachian tube dysfunction, allergic rhinitis and SNHL diagnosed.  Follow up OV notes are not available for review  2) Type 2 DM:  Sugars < 100 fasting.   Diet controlled DM.  Has had infrequent hypoglycemia since her early adult years, confirmed with  [facility] in the 70's  During pregnancy (did NOT HAVE GESTATIONAL DIABETES)   3) HTN:  Hypertension: patient checks blood pressure twice weekly at home.  Readings have been for the most part > 140/80 at rest .  Patient is following a reduce salt diet most days and is taking losartan as prescribed.  She is using  A Salt substitute with potassium  No side effects.     Outpatient Medications Prior to Visit  Medication Sig Dispense Refill  . Accu-Chek Softclix Lancets lancets USE 1 LANCET TO CHECK GLUCOSE ONCE DAILY 100 each 0  . Ascorbic Acid (VITAMIN C) 100 MG tablet Take 100 mg by mouth daily.    . B Complex-Biotin-FA (TH VITAMIN B 50/B-COMPLEX PO) Take by mouth.    . blood glucose meter kit and supplies KIT Use once daily to check fasting and post prandial blood sugars 1 each 0  . blood glucose meter kit and supplies Dispense based on patient and insurance preference. Use up to four times daily as directed. (FOR ICD-10 E10.9, E11.9). 1 each 0  . Blood Glucose Monitoring Suppl (ACCU-CHEK AVIVA PLUS) w/Device KIT 1 Device by Does not apply route daily. 1 kit 0  . cetirizine (ZYRTEC) 10 MG tablet Take 10 mg by mouth daily.    . Cholecalciferol (VITAMIN D3) 10 MCG (400 UNIT) tablet Take 400 Units by mouth daily.    . Chromium 200 MCG CAPS Take 1 capsule by mouth daily.    Marland Kitchen losartan (COZAAR) 100 MG tablet Take 1 tablet by mouth once daily 90 tablet 0  . mupirocin ointment (BACTROBAN) 2 % Place 1 application into the nose 2 (two) times daily. 22 g 0  . PANTOTHENIC ACID PO Take 1 capsule  by mouth daily.    . Potassium 99 MG TABS Take by mouth.    . triamcinolone cream (KENALOG) 0.1 % Apply 1 application topically 2 (two) times daily. 30 g 0  . vitamin A 10000 UT capsule Take 10,000 Units by mouth daily.    . Zinc 30 MG TABS Take by mouth.    Marland Kitchen glucose blood (ACCU-CHEK AVIVA PLUS) test strip Use to check blood sugar once daily 100 each 12  . glucose blood test strip Use as instructed 100 each 2   No facility-administered medications prior to visit.    Review of Systems;  Patient denies headache, fevers, malaise, unintentional weight loss, skin rash, eye pain, sinus congestion and sinus pain, sore  throat, dysphagia,  hemoptysis , cough, dyspnea, wheezing, chest pain, palpitations, orthopnea, edema, abdominal pain, nausea, melena, diarrhea, constipation, flank pain, dysuria, hematuria, urinary  Frequency, nocturia, numbness, tingling, seizures,  Focal weakness, Loss of consciousness,  Tremor, insomnia, depression, anxiety, and suicidal ideation.      Objective:  BP (!) 144/84 (BP Location: Left Arm, Patient Position: Sitting, Cuff Size: Normal)   Pulse 77   Temp (!) 96.4 F (35.8 C) (Oral)   Resp 16   Ht $R'5\' 5"'uJ$  (1.651 m)   Wt 173 lb (78.5 kg)   SpO2 97%   BMI 28.79 kg/m   BP Readings from Last 3 Encounters:  02/12/21 (!) 144/84  08/14/20 (!) 142/76  02/06/20 120/76    Wt Readings from Last 3 Encounters:  02/12/21 173 lb (78.5 kg)  08/14/20 167 lb 3.2 oz (75.8 kg)  04/23/20 173 lb (78.5 kg)    General appearance: alert, cooperative and appears stated age Ears: normal TM's and external ear canals both ears Throat: lips, mucosa, and tongue normal; teeth and gums normal Neck: no adenopathy, no carotid bruit, supple, symmetrical, trachea midline and thyroid not enlarged, symmetric, no tenderness/mass/nodules Back: symmetric, no curvature. ROM normal. No CVA tenderness. Lungs: clear to auscultation bilaterally Heart: regular rate and rhythm, S1, S2 normal, no murmur, click, rub or gallop Abdomen: soft, non-tender; bowel sounds normal; no masses,  no organomegaly Pulses: 2+ and symmetric Skin: Skin color, texture, turgor normal. No rashes or lesions Lymph nodes: Cervical, supraclavicular, and axillary nodes normal.  Lab Results  Component Value Date   HGBA1C 5.9 02/12/2021   HGBA1C 6.2 08/13/2020   HGBA1C 6.3 02/05/2020    Lab Results  Component Value Date   CREATININE 0.74 02/12/2021   CREATININE 0.86 08/13/2020   CREATININE 0.87 02/05/2020    Lab Results  Component Value Date   WBC 3.6 (L) 08/13/2020   HGB 11.9 (L) 08/13/2020   HCT 36.1 08/13/2020   PLT  189.0 08/13/2020   GLUCOSE 90 02/12/2021   CHOL 142 08/13/2020   TRIG 63.0 08/13/2020   HDL 59.80 08/13/2020   LDLCALC 70 08/13/2020   ALT 18 02/12/2021   AST 27 02/12/2021   NA 140 02/12/2021   K 4.0 02/12/2021   CL 104 02/12/2021   CREATININE 0.74 02/12/2021   BUN 9 02/12/2021   CO2 30 02/12/2021   TSH 3.28 08/13/2020   HGBA1C 5.9 02/12/2021   MICROALBUR <0.7 08/14/2020    MM 3D SCREEN BREAST BILATERAL  Result Date: 09/29/2020 CLINICAL DATA:  Screening. EXAM: DIGITAL SCREENING BILATERAL MAMMOGRAM WITH TOMO AND CAD COMPARISON:  Previous exam(s). ACR Breast Density Category b: There are scattered areas of fibroglandular density. FINDINGS: There are no findings suspicious for malignancy. Images were processed with CAD. IMPRESSION: No mammographic  evidence of malignancy. A result letter of this screening mammogram will be mailed directly to the patient. RECOMMENDATION: Screening mammogram in one year. (Code:[id number]-B-01Y) BI-RADS CATEGORY  1: Negative. Electronically Signed   By: Ammie Ferrier M.D.   On: 09/29/2020 16:52    Assessment & Plan:   Problem List Items Addressed This Visit      Unprioritized   Type 2 diabetes mellitus without complication, without long-term current use of insulin ([place]) - Primary     Remains.  well-controlled on diet alone .  hemoglobin A1c has been consistently at or  less than 7.0 . Patient is up-to-date on eye exams and foot exam is normal today. Patient Diagnosed in August 2018 with an A1c of 6.5 , diet controlled since the.  She has no microalbuminuria. Patient has deferred statin therapy for CAD risk reduction given her LDL of 70,  And is taking ACE/ARB for renal protection and hypertension    Lab Results  Component Value Date   HGBA1C 5.9 02/12/2021   Lab Results  Component Value Date   LABMICR See below: 10/30/2015   LABMICR See below: 10/13/2015   MICROALBUR <0.7 08/14/2020   MICROALBUR <0.7 02/05/2020    Lab Results  Component Value  Date   CHOL 142 08/13/2020   HDL 59.80 08/13/2020   LDLCALC 70 08/13/2020   TRIG 63.0 08/13/2020   CHOLHDL 2 08/13/2020           Relevant Orders   Hemoglobin A1c (Completed)   Comprehensive metabolic panel (Completed)   Sensorineural hearing loss (SNHL) of left ear    Occurred during a flight to [place] in 2019, but ENT feels it occurred as result of vestibular neuronitis. She is requesting a 2nd opinion  On management. Refer to [facility]       Relevant Orders   Ambulatory referral to ENT   Essential hypertension    Not at goal  by home readings on losartan 100 mg daily .  Adding 2.5 mg hctz . Renal function stable.  Lab Results  Component Value Date   CREATININE 0.74 02/12/2021   Lab Results  Component Value Date   NA 140 02/12/2021   K 4.0 02/12/2021   CL 104 02/12/2021   CO2 30 02/12/2021         Relevant Medications   hydrochlorothiazide (MICROZIDE) 12.5 MG capsule    Other Visit Diagnoses    Eustachian tube dysfunction, left       Relevant Orders   Ambulatory referral to ENT      I am having Cheree M. Ciaravino start on hydrochlorothiazide and Zoster Vaccine Adjuvanted. I am also having her maintain her vitamin C, cetirizine, Chromium, PANTOTHENIC ACID PO, Vitamin D3, vitamin A, Potassium, Zinc, B Complex-Biotin-FA (TH VITAMIN B 50/B-COMPLEX PO), blood glucose meter kit and supplies, triamcinolone cream, mupirocin ointment, losartan, Accu-Chek Softclix Lancets, Accu-Chek Aviva Plus, and blood glucose meter kit and supplies.  Meds ordered this encounter  Medications  . hydrochlorothiazide (MICROZIDE) 12.5 MG capsule    Sig: Take 1 capsule (12.5 mg total) by mouth daily.    Dispense:  30 capsule    Refill:  5  . Zoster Vaccine Adjuvanted [facility]) injection    Sig: Inject 0.5 mLs into the muscle once for 1 dose.    Dispense:  1 each    Refill:  1    There are no discontinued medications.  Follow-up: Return in about 6 months (around 08/14/2021) for follow  up diabetes.   Helene Kelp  Ether Griffins, MD

## 2021-02-14 NOTE — Assessment & Plan Note (Signed)
Remains.  well-controlled on diet alone .  hemoglobin A1c has been consistently at or  less than 7.0 . Patient is up-to-date on eye exams and foot exam is normal today. Patient Diagnosed in August 2018 with an A1c of 6.5 , diet controlled since the.  She has no microalbuminuria. Patient has deferred statin therapy for CAD risk reduction given her LDL of 70,  And is taking ACE/ARB for renal protection and hypertension    Lab Results  Component Value Date   HGBA1C 5.9 02/12/2021   Lab Results  Component Value Date   LABMICR See below: 10/30/2015   LABMICR See below: 10/13/2015   MICROALBUR <0.7 08/14/2020   MICROALBUR <0.7 02/05/2020    Lab Results  Component Value Date   CHOL 142 08/13/2020   HDL 59.80 08/13/2020   LDLCALC 70 08/13/2020   TRIG 63.0 08/13/2020   CHOLHDL 2 08/13/2020

## 2021-02-14 NOTE — Assessment & Plan Note (Signed)
Not at goal  by home readings on losartan 100 mg daily .  Adding 2.5 mg hctz . Renal function stable.  Lab Results  Component Value Date   CREATININE 0.74 02/12/2021   Lab Results  Component Value Date   NA 140 02/12/2021   K 4.0 02/12/2021   CL 104 02/12/2021   CO2 30 02/12/2021

## 2021-02-14 NOTE — Assessment & Plan Note (Signed)
Occurred during a flight to [place] [employer] in 2019, but ENT feels it occurred as result of vestibular neuronitis. She is requesting a 2nd opinion  On management. Refer to [facility] ENT

## 2021-02-17 ENCOUNTER — Telehealth: Payer: Self-pay | Admitting: Internal Medicine

## 2021-02-17 NOTE — Telephone Encounter (Signed)
PT called to inform that the Blood Glucose Monitoring Suppl (ACCU-CHEK AVIVA PLUS) w/Device KIT was suppose to be Accu Chek regular.

## 2021-02-19 NOTE — Telephone Encounter (Signed)
LMTCB

## 2021-02-24 NOTE — Telephone Encounter (Signed)
Spoke with pt and she stated that it was just that the rx was sent to the wrong [facility] but it has been fixed now. Pharmacy has been updated.

## 2021-03-09 ENCOUNTER — Other Ambulatory Visit: Payer: Self-pay | Admitting: Internal Medicine

## 2021-04-08 ENCOUNTER — Encounter: Payer: Self-pay | Admitting: *Deleted

## 2021-04-09 ENCOUNTER — Ambulatory Visit: Payer: Medicare PPO | Admitting: Anesthesiology

## 2021-04-09 ENCOUNTER — Encounter: Payer: Self-pay | Admitting: *Deleted

## 2021-04-09 ENCOUNTER — Encounter
Admission: RE | Disposition: A | Discharge status: Home / Self Care | Payer: Self-pay | Source: Home / Self Care | Attending: Gastroenterology

## 2021-04-09 ENCOUNTER — Ambulatory Visit
Admission: RE | Admit: 2021-04-09 | Discharge: 2021-04-09 | Disposition: A | Discharge status: Home / Self Care | Payer: Medicare PPO | Attending: Gastroenterology | Admitting: Gastroenterology

## 2021-04-09 DIAGNOSIS — Z800 Family history of malignant neoplasm of digestive organs: Secondary | ICD-10-CM | POA: Diagnosis not present

## 2021-04-09 DIAGNOSIS — Z8711 Personal history of peptic ulcer disease: Secondary | ICD-10-CM | POA: Diagnosis not present

## 2021-04-09 DIAGNOSIS — K219 Gastro-esophageal reflux disease without esophagitis: Secondary | ICD-10-CM | POA: Diagnosis not present

## 2021-04-09 DIAGNOSIS — Z1211 Encounter for screening for malignant neoplasm of colon: Secondary | ICD-10-CM | POA: Diagnosis not present

## 2021-04-09 DIAGNOSIS — K2100 Gastro-esophageal reflux disease with esophagitis, without bleeding: Secondary | ICD-10-CM | POA: Diagnosis not present

## 2021-04-09 DIAGNOSIS — Z79899 Other long term (current) drug therapy: Secondary | ICD-10-CM | POA: Insufficient documentation

## 2021-04-09 DIAGNOSIS — I10 Essential (primary) hypertension: Secondary | ICD-10-CM | POA: Diagnosis not present

## 2021-04-09 DIAGNOSIS — E785 Hyperlipidemia, unspecified: Secondary | ICD-10-CM | POA: Diagnosis not present

## 2021-04-09 HISTORY — PX: ESOPHAGOGASTRODUODENOSCOPY (EGD) WITH PROPOFOL: SHX5813

## 2021-04-09 HISTORY — PX: COLONOSCOPY WITH PROPOFOL: SHX5780

## 2021-04-09 HISTORY — DX: Type 2 diabetes mellitus without complications: E11.9

## 2021-04-09 LAB — GLUCOSE, CAPILLARY: Glucose-Capillary: 92 mg/dL (ref 70–99)

## 2021-04-09 SURGERY — ESOPHAGOGASTRODUODENOSCOPY (EGD) WITH PROPOFOL
Anesthesia: General

## 2021-04-09 MED ORDER — PROPOFOL 500 MG/50ML IV EMUL
INTRAVENOUS | Status: AC
  Filled 2021-04-09: qty 50

## 2021-04-09 MED ORDER — SODIUM CHLORIDE 0.9 % IV SOLN: INTRAVENOUS | Status: DC

## 2021-04-09 MED ORDER — LIDOCAINE HCL (CARDIAC) PF 100 MG/5ML IV SOSY
PREFILLED_SYRINGE | INTRAVENOUS | Status: DC | PRN
  Administered 2021-04-09: 40 mg via INTRAVENOUS

## 2021-04-09 MED ORDER — LIDOCAINE HCL (PF) 2 % IJ SOLN
INTRAMUSCULAR | Status: AC
  Filled 2021-04-09: qty 2

## 2021-04-09 MED ORDER — PROPOFOL 500 MG/50ML IV EMUL
INTRAVENOUS | Status: DC | PRN
  Administered 2021-04-09: 150 ug/kg/min via INTRAVENOUS

## 2021-04-09 MED ORDER — PROPOFOL 10 MG/ML IV BOLUS
INTRAVENOUS | Status: DC | PRN
  Administered 2021-04-09: 60 mg via INTRAVENOUS

## 2021-04-09 NOTE — H&P (Signed)
Outpatient short stay form Pre-procedure 04/09/2021 9:02 AM Kari Miyamoto MD, MPH  Primary Physician: Dr. Derrel Nip  Reason for visit:  GERD/Screening  History of present illness:   70 y/o lady with history of hypertension and history of Ulcers here for EGD for GERD and screening colonoscopy. Had normal colonoscopy in 2012. Grandmother with colon cancer. No blood thinners. History of hysterectomy.    Current Facility-Administered Medications:    0.9 %  sodium chloride infusion, , Intravenous, Continuous, Cody Oliger, Hilton Cork, MD, Last Rate: 20 mL/hr at 04/09/21 0834, New Bag at 04/09/21 0834  Medications Prior to Admission  Medication Sig Dispense Refill Last Dose   dicyclomine (BENTYL) 10 MG capsule Take 10 mg by mouth.      famotidine (PEPCID) 10 MG tablet Take 10 mg by mouth daily.      fluticasone (FLONASE) 50 MCG/ACT nasal spray Place into both nostrils daily.      hydrochlorothiazide (MICROZIDE) 12.5 MG capsule Take 1 capsule (12.5 mg total) by mouth daily. 30 capsule 5 04/08/2021   losartan (COZAAR) 100 MG tablet Take 1 tablet by mouth once daily 90 tablet 0 04/08/2021   Accu-Chek Softclix Lancets lancets USE 1 LANCET TO CHECK GLUCOSE ONCE DAILY 100 each 0    Ascorbic Acid (VITAMIN C) 100 MG tablet Take 100 mg by mouth daily.      B Complex-Biotin-FA (TH VITAMIN B 50/B-COMPLEX PO) Take by mouth.      blood glucose meter kit and supplies KIT Use once daily to check fasting and post prandial blood sugars 1 each 0    blood glucose meter kit and supplies Dispense based on patient and insurance preference. Use up to four times daily as directed. (FOR ICD-10 E10.9, E11.9). 1 each 0    Blood Glucose Monitoring Suppl (ACCU-CHEK AVIVA PLUS) w/Device KIT 1 Device by Does not apply route daily. 1 kit 0    cetirizine (ZYRTEC) 10 MG tablet Take 10 mg by mouth daily.      Cholecalciferol (VITAMIN D3) 10 MCG (400 UNIT) tablet Take 400 Units by mouth daily.      Chromium 200 MCG CAPS Take 1 capsule  by mouth daily.      glucose blood (ACCU-CHEK AVIVA PLUS) test strip Use to check blood sugar once daily 100 each 12    mupirocin ointment (BACTROBAN) 2 % Place 1 application into the nose 2 (two) times daily. 22 g 0    PANTOTHENIC ACID PO Take 1 capsule by mouth daily.      Potassium 99 MG TABS Take by mouth.      triamcinolone cream (KENALOG) 0.1 % Apply 1 application topically 2 (two) times daily. 30 g 0    vitamin A 10000 UT capsule Take 10,000 Units by mouth daily.      Zinc 30 MG TABS Take by mouth.        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 2009: Reported workup at [facility] including ECHO, Stress Test and Cardiac Cath; records N/A.  ECHO with mild LVH, mild MVP; stress test and cath reportedly normal    Depression    Diabetes mellitus without complication ([place])    Edema    GERD (gastroesophageal reflux disease)    Hypertension  IBS (irritable bowel syndrome)    Mitral valve prolapse    Patient is Jehovah's Witness    Sleep apnea    MILD-NO CPAP     Review of systems:  Otherwise negative.    Physical Exam  Gen: Alert, oriented. Appears stated age.  HEENT: PERRLA. Lungs: No respiratory distress CV: RRR Abd: soft, benign, no masses Ext: No edema    Planned procedures: Proceed with EGD/colonoscopy. The patient understands the nature of the planned procedure, indications, risks, alternatives and potential complications including but not limited to bleeding, infection, perforation, damage to internal organs and possible oversedation/side effects from anesthesia. The patient agrees and gives consent to proceed.  Please refer to procedure notes for findings, recommendations and patient disposition/instructions.     Kari Miyamoto MD, MPH Gastroenterology 04/09/2021  9:02 AM

## 2021-04-09 NOTE — Transfer of Care (Signed)
Immediate Anesthesia Transfer of Care Note  Patient: Kari Lyons  Procedure(s) Performed: ESOPHAGOGASTRODUODENOSCOPY (EGD) WITH PROPOFOL COLONOSCOPY WITH PROPOFOL  Patient Location: PACU  Anesthesia Type:General  Level of Consciousness: awake and drowsy  Airway & Oxygen Therapy: Patient Spontanous Breathing  Post-op Assessment: Report given to RN and Post -op Vital signs reviewed and stable  Post vital signs: Reviewed and stable  Last Vitals:  Vitals Value Taken Time  BP 112/59 04/09/21 0941  Temp    Pulse 64 04/09/21 0941  Resp 14 04/09/21 0941  SpO2 98 % 04/09/21 0941    Last Pain:  Vitals:   04/09/21 0813  TempSrc: Tympanic  PainSc: 0-No pain         Complications: No notable events documented.

## 2021-04-09 NOTE — Anesthesia Postprocedure Evaluation (Signed)
Anesthesia Post Note  Patient: Kari Lyons  Procedure(s) Performed: ESOPHAGOGASTRODUODENOSCOPY (EGD) WITH PROPOFOL COLONOSCOPY WITH PROPOFOL  Patient location during evaluation: Endoscopy Anesthesia Type: General Level of consciousness: awake and alert Pain management: pain level controlled Vital Signs Assessment: post-procedure vital signs reviewed and stable Respiratory status: spontaneous breathing and respiratory function stable Cardiovascular status: stable Anesthetic complications: no   No notable events documented.   Last Vitals:  Vitals:   04/09/21 1000 04/09/21 1010  BP: 133/63 (!) 144/56  Pulse: (!) 56 (!) 56  Resp: 12 20  Temp:    SpO2: 100% 100%    Last Pain:  Vitals:   04/09/21 0813  TempSrc: Tympanic  PainSc: 0-No pain                 Emerie Vanderkolk K

## 2021-04-09 NOTE — Anesthesia Preprocedure Evaluation (Signed)
Anesthesia Evaluation  Patient identified by MRN, date of birth, ID band Patient awake    Reviewed: Allergy & Precautions, NPO status , Patient's Chart, lab work & pertinent test results  History of Anesthesia Complications Negative for: history of anesthetic complications  Airway Mallampati: II       Dental   Pulmonary sleep apnea (mild, no CPAP) , neg COPD, Not current smoker,           Cardiovascular hypertension, Pt. on medications (-) Past MI and (-) CHF (-) dysrhythmias (-) Valvular Problems/Murmurs     Neuro/Psych neg Seizures Depression    GI/Hepatic Neg liver ROS, GERD  ,  Endo/Other  diabetes, Type 2, Oral Hypoglycemic Agents  Renal/GU negative Renal ROS     Musculoskeletal   Abdominal   Peds  Hematology   Anesthesia Other Findings   Reproductive/Obstetrics                            Anesthesia Physical Anesthesia Plan  ASA: 3  Anesthesia Plan: General   Post-op Pain Management:    Induction: Intravenous  PONV Risk Score and Plan: 3 and Propofol infusion and TIVA  Airway Management Planned: Nasal Cannula  Additional Equipment:   Intra-op Plan:   Post-operative Plan:   Informed Consent: I have reviewed the patients History and Physical, chart, labs and discussed the procedure including the risks, benefits and alternatives for the proposed anesthesia with the patient or authorized representative who has indicated his/her understanding and acceptance.       Plan Discussed with:   Anesthesia Plan Comments:         Anesthesia Quick Evaluation

## 2021-04-09 NOTE — Interval H&P Note (Signed)
History and Physical Interval Note:  04/09/2021 9:10 AM  Kari Lyons  has presented today for surgery, with the diagnosis of [place].  The various methods of treatment have been discussed with the patient and family. After consideration of risks, benefits and other options for treatment, the patient has consented to  Procedure(s) with comments: ESOPHAGOGASTRODUODENOSCOPY (EGD) WITH PROPOFOL (N/A) - DM COLONOSCOPY WITH PROPOFOL (N/A) as a surgical intervention.  The patient's history has been reviewed, patient examined, no change in status, stable for surgery.  I have reviewed the patient's chart and labs.  Questions were answered to the patient's satisfaction.     Lesly Rubenstein  Ok to proceed with EGD/Colonoscopy

## 2021-04-09 NOTE — Op Note (Signed)
[facility] Gastroenterology Patient Name: Kari Lyons Procedure Date: 04/09/2021 9:00 AM MRN: [id number] Account #: [id number] Date of Birth: [birth year] Admit Type: Outpatient Age: 70 Room: [facility] ENDO ROOM 3 Gender: Female Note Status: Finalized Procedure:             Colonoscopy Indications:           Screening for colorectal malignant neoplasm Providers:             Andrey Farmer MD, MD Medicines:             Monitored Anesthesia Care Complications:         No immediate complications. Procedure:             Pre-Anesthesia Assessment:                        - Prior to the procedure, a History and Physical was                         performed, and patient medications and allergies were                         reviewed. The patient is competent. The risks and                         benefits of the procedure and the sedation options and                         risks were discussed with the patient. All questions                         were answered and informed consent was obtained.                         Patient identification and proposed procedure were                         verified by the physician, the nurse, the anesthetist                         and the technician in the endoscopy suite. Mental                         Status Examination: alert and oriented. Airway                         Examination: normal oropharyngeal airway and neck                         mobility. Respiratory Examination: clear to                         auscultation. CV Examination: normal. Prophylactic                         Antibiotics: The patient does not require prophylactic                         antibiotics. Prior Anticoagulants: The patient has  taken no previous anticoagulant or antiplatelet                         agents. ASA Grade Assessment: II - A patient with mild                         systemic disease. After reviewing the risks and                          benefits, the patient was deemed in satisfactory                         condition to undergo the procedure. The anesthesia                         plan was to use monitored anesthesia care (MAC).                         Immediately prior to administration of medications,                         the patient was re-assessed for adequacy to receive                         sedatives. The heart rate, respiratory rate, oxygen                         saturations, blood pressure, adequacy of pulmonary                         ventilation, and response to care were monitored                         throughout the procedure. The physical status of the                         patient was re-assessed after the procedure.                        After obtaining informed consent, the colonoscope was                         passed under direct vision. Throughout the procedure,                         the patient's blood pressure, pulse, and oxygen                         saturations were monitored continuously. The                         Colonoscope was introduced through the anus and                         advanced to the the cecum, identified by appendiceal                         orifice and ileocecal valve. The colonoscopy was  performed without difficulty. The patient tolerated                         the procedure well. The quality of the bowel                         preparation was good. Findings:      The perianal and digital rectal examinations were normal.      The entire examined colon appeared normal on direct and retroflexion       views. Impression:            - The entire examined colon is normal on direct and                         retroflexion views.                        - No specimens collected. Recommendation:        - Discharge patient to home.                        - Resume previous diet.                        - Continue  present medications.                        - Repeat colonoscopy in 10 years for screening                         purposes.                        - Return to referring physician as previously                         scheduled. Procedure Code(s):     --- Professional ---                        F7510, Colorectal cancer screening; colonoscopy on                         individual not meeting criteria for high risk Diagnosis Code(s):     --- Professional ---                        Z12.11, Encounter for screening for malignant neoplasm                         of colon CPT copyright 2019 American [employer]. All rights reserved. The codes documented in this report are preliminary and upon coder review may  be revised to meet current compliance requirements. Andrey Farmer MD, MD 04/09/2021 9:42:39 AM Number of Addenda: 0 Note Initiated On: 04/09/2021 9:00 AM Scope Withdrawal Time: 0 hours 6 minutes 40 seconds  Total Procedure Duration: 0 hours 12 minutes 26 seconds  Estimated Blood Loss:  Estimated blood loss: none.      [facility]

## 2021-04-09 NOTE — Op Note (Signed)
[facility] Gastroenterology Patient Name: Kari Lyons Procedure Date: 04/09/2021 9:00 AM MRN: [id number] Account #: [id number] Date of Birth: August 11, 1951 Admit Type: Outpatient Age: 70 Room: [facility] ENDO ROOM 3 Gender: Female Note Status: Finalized Procedure:             Upper GI endoscopy Indications:           Gastro-esophageal reflux disease, Personal history of                         peptic ulcer disease Providers:             Andrey Farmer MD, MD Medicines:             Monitored Anesthesia Care Complications:         No immediate complications. Procedure:             Pre-Anesthesia Assessment:                        - Prior to the procedure, a History and Physical was                         performed, and patient medications and allergies were                         reviewed. The patient is competent. The risks and                         benefits of the procedure and the sedation options and                         risks were discussed with the patient. All questions                         were answered and informed consent was obtained.                         Patient identification and proposed procedure were                         verified by the physician, the nurse, the anesthetist                         and the technician in the endoscopy suite. Mental                         Status Examination: alert and oriented. Airway                         Examination: normal oropharyngeal airway and neck                         mobility. Respiratory Examination: clear to                         auscultation. CV Examination: normal. Prophylactic                         Antibiotics: The patient does not require prophylactic  antibiotics. Prior Anticoagulants: The patient has                         taken no previous anticoagulant or antiplatelet                         agents. ASA Grade Assessment: II - A patient with mild                          systemic disease. After reviewing the risks and                         benefits, the patient was deemed in satisfactory                         condition to undergo the procedure. The anesthesia                         plan was to use monitored anesthesia care (MAC).                         Immediately prior to administration of medications,                         the patient was re-assessed for adequacy to receive                         sedatives. The heart rate, respiratory rate, oxygen                         saturations, blood pressure, adequacy of pulmonary                         ventilation, and response to care were monitored                         throughout the procedure. The physical status of the                         patient was re-assessed after the procedure.                        After obtaining informed consent, the endoscope was                         passed under direct vision. Throughout the procedure,                         the patient's blood pressure, pulse, and oxygen                         saturations were monitored continuously. The Endoscope                         was introduced through the mouth, and advanced to the                         second part of duodenum. The upper GI endoscopy was  accomplished without difficulty. The patient tolerated                         the procedure well. Findings:      The examined esophagus was normal.      The entire examined stomach was normal.      The examined duodenum was normal. Impression:            - Normal esophagus.                        - Normal stomach.                        - Normal examined duodenum.                        - No specimens collected. Recommendation:        - Perform a colonoscopy today. Procedure Code(s):     --- Professional ---                        [phone], Esophagogastroduodenoscopy, flexible,                         transoral; diagnostic,  including collection of                         specimen(s) by brushing or washing, when performed                         (separate procedure) Diagnosis Code(s):     --- Professional ---                        K21.9, Gastro-esophageal reflux disease without                         esophagitis                        Z87.11, Personal history of peptic ulcer disease CPT copyright 2019 American Medical Association. All rights reserved. The codes documented in this report are preliminary and upon coder review may  be revised to meet current compliance requirements. Andrey Farmer MD, MD 04/09/2021 9:39:32 AM Number of Addenda: 0 Note Initiated On: 04/09/2021 9:00 AM Estimated Blood Loss:  Estimated blood loss: none.      [facility]

## 2021-04-09 NOTE — Anesthesia Postprocedure Evaluation (Signed)
Anesthesia Post Note  Patient: Kari Lyons  Procedure(s) Performed: ESOPHAGOGASTRODUODENOSCOPY (EGD) WITH PROPOFOL COLONOSCOPY WITH PROPOFOL  Anesthesia Type: General Anesthetic complications: no   No notable events documented.   Last Vitals:  Vitals:   04/09/21 0813 04/09/21 0941  BP: (!) 179/77 (!) 112/59  Pulse: 61 64  Resp: 18 14  Temp: (!) 35.7 C   SpO2: 100% 98%    Last Pain:  Vitals:   04/09/21 0813  TempSrc: Tympanic  PainSc: 0-No pain                 Patrice Matthew R Jasslyn Finkel

## 2021-04-26 ENCOUNTER — Telehealth: Payer: Self-pay

## 2021-04-26 ENCOUNTER — Ambulatory Visit (INDEPENDENT_AMBULATORY_CARE_PROVIDER_SITE_OTHER): Payer: Medicare PPO

## 2021-04-26 VITALS — Ht 65.0 in | Wt 178.0 lb

## 2021-04-26 DIAGNOSIS — Z0000 Encounter for general adult medical examination without abnormal findings: Secondary | ICD-10-CM | POA: Diagnosis not present

## 2021-04-26 NOTE — Patient Instructions (Addendum)
Kari Lyons , Thank you for taking time to come for your Medicare Wellness Visit. I appreciate your ongoing commitment to your health goals. Please review the following plan we discussed and let me know if I can assist you in the future.   These are the goals we discussed:  Goals       Patient Stated     Increase physical activity (pt-stated)      I would like to start walking more for exercise         This is a list of the screening recommended for you and due dates:  Health Maintenance  Topic Date Due   Complete foot exam   02/05/2021   Zoster (Shingles) Vaccine (1 of 2) 07/27/2021*   Flu Shot  05/17/2021   COVID-19 Vaccine (5 - Booster for Moderna series) 06/29/2021   Hemoglobin A1C  08/14/2021   Eye exam for diabetics  08/19/2021   Mammogram  09/28/2021   Colon Cancer Screening  04/10/2031   DEXA scan (bone density measurement)  Completed   Hepatitis C Screening: USPSTF Recommendation to screen - Ages 74-79 yo.  Completed   HPV Vaccine  Aged Out   Tetanus Vaccine  Discontinued   Pneumonia vaccines  Discontinued  *Topic was postponed. The date shown is not the original due date.    Advanced directives: on file  Conditions/risks identified: none new  Follow up in one year for your annual wellness visit    Preventive Care 65 Years and Older, Female Preventive care refers to lifestyle choices and visits with your health care provider that can promote health and wellness. What does preventive care include? A yearly physical exam. This is also called an annual well check. Dental exams once or twice a year. Routine eye exams. Ask your health care provider how often you should have your eyes checked. Personal lifestyle choices, including: Daily care of your teeth and gums. Regular physical activity. Eating a healthy diet. Avoiding tobacco and drug use. Limiting alcohol use. Practicing safe sex. Taking low-dose aspirin every day. Taking vitamin and mineral supplements  as recommended by your health care provider. What happens during an annual well check? The services and screenings done by your health care provider during your annual well check will depend on your age, overall health, lifestyle risk factors, and family history of disease. Counseling  Your health care provider may ask you questions about your: Alcohol use. Tobacco use. Drug use. Emotional well-being. Home and relationship well-being. Sexual activity. Eating habits. History of falls. Memory and ability to understand (cognition). Work and work Statistician. Reproductive health. Screening  You may have the following tests or measurements: Height, weight, and BMI. Blood pressure. Lipid and cholesterol levels. These may be checked every 5 years, or more frequently if you are over 74 years old. Skin check. Lung cancer screening. You may have this screening every year starting at age 6 if you have a 30-pack-year history of smoking and currently smoke or have quit within the past 15 years. Fecal occult blood test (FOBT) of the stool. You may have this test every year starting at age 65. Flexible sigmoidoscopy or colonoscopy. You may have a sigmoidoscopy every 5 years or a colonoscopy every 10 years starting at age 41. Hepatitis C blood test. Hepatitis B blood test. Sexually transmitted disease (STD) testing. Diabetes screening. This is done by checking your blood sugar (glucose) after you have not eaten for a while (fasting). You may have this done every 1-3 years.  Bone density scan. This is done to screen for osteoporosis. You may have this done starting at age 29. Mammogram. This may be done every 1-2 years. Talk to your health care provider about how often you should have regular mammograms. Talk with your health care provider about your test results, treatment options, and if necessary, the need for more tests. Vaccines  Your health care provider may recommend certain vaccines, such  as: Influenza vaccine. This is recommended every year. Tetanus, diphtheria, and acellular pertussis (Tdap, Td) vaccine. You may need a Td booster every 10 years. Zoster vaccine. You may need this after age 51. Pneumococcal 13-valent conjugate (PCV13) vaccine. One dose is recommended after age 83. Pneumococcal polysaccharide (PPSV23) vaccine. One dose is recommended after age 34. Talk to your health care provider about which screenings and vaccines you need and how often you need them. This information is not intended to replace advice given to you by your health care provider. Make sure you discuss any questions you have with your health care provider. Document Released: 10/30/2015 Document Revised: 06/22/2016 Document Reviewed: 08/04/2015 Elsevier Interactive Patient Education  2017 [place] Prevention in the Home Falls can cause injuries. They can happen to people of all ages. There are many things you can do to make your home safe and to help prevent falls. What can I do on the outside of my home? Regularly fix the edges of walkways and driveways and fix any cracks. Remove anything that might make you trip as you walk through a door, such as a raised step or threshold. Trim any bushes or trees on the path to your home. Use bright outdoor lighting. Clear any walking paths of anything that might make someone trip, such as rocks or tools. Regularly check to see if handrails are loose or broken. Make sure that both sides of any steps have handrails. Any raised decks and porches should have guardrails on the edges. Have any leaves, snow, or ice cleared regularly. Use sand or salt on walking paths during winter. Clean up any spills in your garage right away. This includes oil or grease spills. What can I do in the bathroom? Use night lights. Install grab bars by the toilet and in the tub and shower. Do not use towel bars as grab bars. Use non-skid mats or decals in the tub or  shower. If you need to sit down in the shower, use a plastic, non-slip stool. Keep the floor dry. Clean up any water that spills on the floor as soon as it happens. Remove soap buildup in the tub or shower regularly. Attach bath mats securely with double-sided non-slip rug tape. Do not have throw rugs and other things on the floor that can make you trip. What can I do in the bedroom? Use night lights. Make sure that you have a light by your bed that is easy to reach. Do not use any sheets or blankets that are too big for your bed. They should not hang down onto the floor. Have a firm chair that has side arms. You can use this for support while you get dressed. Do not have throw rugs and other things on the floor that can make you trip. What can I do in the kitchen? Clean up any spills right away. Avoid walking on wet floors. Keep items that you use a lot in easy-to-reach places. If you need to reach something above you, use a strong step stool that has a grab bar.  Keep electrical cords out of the way. Do not use floor polish or wax that makes floors slippery. If you must use wax, use non-skid floor wax. Do not have throw rugs and other things on the floor that can make you trip. What can I do with my stairs? Do not leave any items on the stairs. Make sure that there are handrails on both sides of the stairs and use them. Fix handrails that are broken or loose. Make sure that handrails are as long as the stairways. Check any carpeting to make sure that it is firmly attached to the stairs. Fix any carpet that is loose or worn. Avoid having throw rugs at the top or bottom of the stairs. If you do have throw rugs, attach them to the floor with carpet tape. Make sure that you have a light switch at the top of the stairs and the bottom of the stairs. If you do not have them, ask someone to add them for you. What else can I do to help prevent falls? Wear shoes that: Do not have high heels. Have  rubber bottoms. Are comfortable and fit you well. Are closed at the toe. Do not wear sandals. If you use a stepladder: Make sure that it is fully opened. Do not climb a closed stepladder. Make sure that both sides of the stepladder are locked into place. Ask someone to hold it for you, if possible. Clearly mark and make sure that you can see: Any grab bars or handrails. First and last steps. Where the edge of each step is. Use tools that help you move around (mobility aids) if they are needed. These include: Canes. Walkers. Scooters. Crutches. Turn on the lights when you go into a dark area. Replace any light bulbs as soon as they burn out. Set up your furniture so you have a clear path. Avoid moving your furniture around. If any of your floors are uneven, fix them. If there are any pets around you, be aware of where they are. Review your medicines with your doctor. Some medicines can make you feel dizzy. This can increase your chance of falling. Ask your doctor what other things that you can do to help prevent falls. This information is not intended to replace advice given to you by your health care provider. Make sure you discuss any questions you have with your health care provider. Document Released: 07/30/2009 Document Revised: 03/10/2016 Document Reviewed: 11/07/2014 Elsevier Interactive Patient Education  2017 [employer].

## 2021-04-26 NOTE — Telephone Encounter (Signed)
Unable to reach patient for scheduled AWV. Unable to leave voice mail. Mailbox full. Reschedule as appropriate.

## 2021-04-26 NOTE — Progress Notes (Signed)
Subjective:   Kari Lyons is a 70 y.o. female who presents for Medicare Annual (Subsequent) preventive examination.  Review of Systems   No ROS.  Medicare Wellness Virtual Visit.  Visual/audio telehealth visit, UTA vital signs.   See social history for additional risk factors.   Cardiac Risk Factors include: advanced age (>52mn, >>45women);diabetes mellitus;hypertension     Objective:    Today's Vitals   04/26/21 1414  Weight: 178 lb (80.7 kg)  Height: '5\' 5"'  (1.651 m)   Body mass index is 29.62 kg/m.  Advanced Directives 04/26/2021 04/09/2021 04/23/2020 04/16/2019 04/13/2018 06/12/2017  Does Patient Have a Medical Advance Directive? Yes Yes Yes Yes Yes Yes  Type of AParamed[place]of [place] will - [place]in[place] will Living will;Healthcare Power of [place]Des ArcLiving will [place]ittlestownLiving will  Does patient want to make changes to medical advance directive? No - Patient declined - No - Patient declined No - Patient declined No - Patient declined No - Patient declined  Copy of [place]seyvillein Chart? Yes - validated most recent copy scanned in chart (See row information) - Yes - validated most recent copy scanned in chart (See row information) Yes - validated most recent copy scanned in chart (See row information) Yes Yes    Current Medications (verified) Outpatient Encounter Medications as of 04/26/2021  Medication Sig   Accu-Chek Softclix Lancets lancets USE 1 LANCET TO CHECK GLUCOSE ONCE DAILY   Ascorbic Acid (VITAMIN C) 100 MG tablet Take 100 mg by mouth daily.   B Complex-Biotin-FA (TH VITAMIN B 50/B-COMPLEX PO) Take by mouth.   blood glucose meter kit and supplies KIT Use once daily to check fasting and post prandial blood sugars   blood glucose meter kit and supplies Dispense based on patient and insurance preference. Use up to four times daily as directed. (FOR  ICD-10 E10.9, E11.9).   Blood Glucose Monitoring Suppl (ACCU-CHEK AVIVA PLUS) w/Device KIT 1 Device by Does not apply route daily.   cetirizine (ZYRTEC) 10 MG tablet Take 10 mg by mouth daily.   Cholecalciferol (VITAMIN D3) 10 MCG (400 UNIT) tablet Take 400 Units by mouth daily.   Chromium 200 MCG CAPS Take 1 capsule by mouth daily.   dicyclomine (BENTYL) 10 MG capsule Take 10 mg by mouth.   famotidine (PEPCID) 10 MG tablet Take 10 mg by mouth daily.   fluticasone (FLONASE) 50 MCG/ACT nasal spray Place into both nostrils daily.   glucose blood (ACCU-CHEK AVIVA PLUS) test strip Use to check blood sugar once daily   hydrochlorothiazide (MICROZIDE) 12.5 MG capsule Take 1 capsule (12.5 mg total) by mouth daily.   losartan (COZAAR) 100 MG tablet Take 1 tablet by mouth once daily   mupirocin ointment (BACTROBAN) 2 % Place 1 application into the nose 2 (two) times daily.   PANTOTHENIC ACID PO Take 1 capsule by mouth daily.   Potassium 99 MG TABS Take by mouth.   triamcinolone cream (KENALOG) 0.1 % Apply 1 application topically 2 (two) times daily.   vitamin A 10000 UT capsule Take 10,000 Units by mouth daily.   Zinc 30 MG TABS Take by mouth.   No facility-administered encounter medications on file as of 04/26/2021.    Allergies (verified) Tetanus toxoids and Aloe vera   History: Past Medical History:  Diagnosis Date   Anemia    H/O    Arrhythmia 2008   Arthritis    Beat,  premature ventricular 02/24/2012   Overview:  A. 2009: Reported workup at [facility] including ECHO, Stress Test and Cardiac Cath; records N/A.  ECHO with mild LVH, mild MVP; stress test and cath reportedly normal    Depression    Diabetes mellitus without complication (HCC)    Edema    GERD (gastroesophageal reflux disease)    Hypertension    IBS (irritable bowel syndrome)    Mitral valve prolapse    Patient is Jehovah's Witness    Sleep apnea    MILD-NO CPAP    Past Surgical History:  Procedure Laterality Date    ABDOMINAL HYSTERECTOMY  1985   BILATERAL SALPINGOOPHORECTOMY  2002   due to fh of ovarian ca   BREAST BIOPSY Right 03/09/2016   papilloma   BREAST LUMPECTOMY Right 06/08/2016   Procedure: BREAST LUMPECTOMY WITH ULTRASOUND IN O.R;  Surgeon: Christene Lye, MD;  Location: [facility] ORS;  Service: General;  Laterality: Right;   BREAST SURGERY Left 02-26-13   subareloar duct excision and mass   COLONOSCOPY  2012   COLONOSCOPY WITH PROPOFOL N/A 04/09/2021   Procedure: COLONOSCOPY WITH PROPOFOL;  Surgeon: Lesly Rubenstein, MD;  Location: [facility] ENDOSCOPY;  Service: Endoscopy;  Laterality: N/A;   ESOPHAGOGASTRODUODENOSCOPY (EGD) WITH PROPOFOL N/A 04/09/2021   Procedure: ESOPHAGOGASTRODUODENOSCOPY (EGD) WITH PROPOFOL;  Surgeon: Lesly Rubenstein, MD;  Location: [facility] ENDOSCOPY;  Service: Endoscopy;  Laterality: N/A;  DM   EXCISION / BIOPSY BREAST / NIPPLE / DUCT Right 1985   negative   EXCISION / BIOPSY BREAST / NIPPLE / DUCT Left 02/26/2013   intraductal papilloma   EXCISION / BIOPSY BREAST / NIPPLE / DUCT Right 06/09/2016   TONSILLECTOMY     Family History  Problem Relation Age of Onset   Hypertension Mother    Arthritis Mother    Parkinson's disease Father    Hypertension Sister    Heart disease Daughter    Cancer Sister 54       ovarian ca   Cancer Brother        multiple myeloma   Diabetes Brother    Cancer Maternal Grandmother        colon ca   Breast cancer Paternal Grandmother 32   Bladder Cancer Paternal Uncle    Prostate cancer Neg Hx    Kidney cancer Neg Hx    Social History   Socioeconomic History   Marital status: Married    Spouse name: Not on file   Number of children: Not on file   Years of education: Not on file   Highest education level: Not on file  Occupational History   Not on file  Tobacco Use   Smoking status: Never   Smokeless tobacco: Never   Tobacco comments:    smoked for 65month when 70yrold  Substance and Sexual Activity   Alcohol use: No     Alcohol/week: 0.0 standard drinks   Drug use: No   Sexual activity: Not on file  Other Topics Concern   Not on file  Social History Narrative   Not on file   Social Determinants of Health   Financial Resource Strain: Low Risk    Difficulty of Paying Living Expenses: Not hard at all  Food Insecurity: No Food Insecurity   Worried About RuCharity fundraisern the Last Year: Never true   RaAndovern the Last Year: Never true  Transportation Needs: No Transportation Needs   Lack of Transportation (Medical): No  Lack of Transportation (Non-Medical): No  Physical Activity: Not on file  Stress: No Stress Concern Present   Feeling of Stress : Not at all  Social Connections: Socially Integrated   Frequency of Communication with Friends and Family: More than three times a week   Frequency of Social Gatherings with Friends and Family: Not on file   Attends Religious Services: 1 to 4 times per year   Active Member of [employer] or Organizations: Yes   Attends Archivist Meetings: 1 to 4 times per year   Marital Status: Married    Tobacco Counseling Counseling given: Not Answered Tobacco comments: smoked for 16month when 70yrold   Clinical Intake:  Pre-visit preparation completed: Yes        Diabetes: Yes (Followed by pcp)  How often do you need to have someone help you when you read instructions, pamphlets, or other written materials from your doctor or pharmacy?: 1 - Never  Interpreter Needed?: No     Activities of Daily Living In your present state of health, do you have any difficulty performing the following activities: 04/26/2021  Hearing? N  Vision? N  Difficulty concentrating or making decisions? N  Walking or climbing stairs? N  Dressing or bathing? N  Doing errands, shopping? N  Preparing Food and eating ? N  Using the Toilet? N  In the past six months, have you accidently leaked urine? N  Do you have problems with loss of bowel control? N   Managing your Medications? N  Managing your Finances? N  Housekeeping or managing your Housekeeping? N  Some recent data might be hidden    Patient Care Team: TuCrecencio McMD as PCP - General (Internal Medicine) SaChristene LyeMD (General Surgery) TaAlbina BilletMD (Internal Medicine)  Indicate any recent Medical Services you may have received from other than [facility] providers in the past year (date may be approximate).     Assessment:   This is a routine wellness examination for Kari Lyons.  I connected with Kari Alexanderoday by telephone and verified that I am speaking with the correct person using two identifiers. Location patient: home Location provider: work Persons participating in the virtual visit: patient, nuMarine scientist   I discussed the limitations, risks, security and privacy concerns of performing an evaluation and management service by telephone and the availability of in person appointments. The patient expressed understanding and verbally consented to this telephonic visit.    Interactive audio and video telecommunications were attempted between this provider and patient, however failed, due to patient having technical difficulties OR patient did not have access to video capability.  We continued and completed visit with audio only.  Some vital signs may be absent or patient reported.   Hearing/Vision screen Hearing Screening - Comments:: Left ear hearing loss She does not wear any device. Followed by Dr. McTami Ribasision Screening - Comments:: [facility] Ina Wears corrective lenses  Diabetic exam; no retinopathy reported.   Dietary issues and exercise activities discussed: Current Exercise Habits: Home exercise routine, Intensity: Mild   Goals Addressed               This Visit's Progress     Patient Stated     Increase physical activity (pt-stated)        I would like to start walking more for exercise         [facility] Scores 04/26/2021 02/12/2021 04/23/2020 04/16/2019 04/13/2018 05/19/2017  03/11/2016  PHQ - 2 Score 0 0 1 1 0 6 0  PHQ- 9 Score - 2 - - - 12 -    Fall Risk Fall Risk  04/26/2021 02/12/2021 04/23/2020 02/06/2020 08/02/2019  Falls in the past year? 0 0 0 0 0  Number falls in past yr: 0 - - - -  Injury with Fall? 0 - - - -  Comment - - - - -  Follow up Falls evaluation completed Falls evaluation completed Falls evaluation completed Falls evaluation completed Falls evaluation completed    Severn: Handrails in use when climbing stairs? Yes   ASSISTIVE DEVICES UTILIZED TO PREVENT FALLS: Life alert? No  Use of a cane, walker or w/c? No   TIMED UP AND GO: Was the test performed? No .   Cognitive Function: MMSE - Mini Mental State Exam 04/13/2018 06/12/2017  Orientation to time 5 5  Orientation to Place 5 5  Registration 3 3  Attention/ Calculation 5 5  Recall 3 3  Language- name 2 objects 2 2  Language- repeat 1 1  Language- follow 3 step command 3 3  Language- read & follow direction 1 1  Write a sentence 1 1  Copy design 1 1  Total score 30 30     6CIT Screen 04/26/2021 04/23/2020 04/16/2019  What Year? 0 points 0 points 0 points  What month? 0 points 0 points 0 points  What time? - - 0 points  Count back from 20 0 points - 0 points  Months in reverse 0 points 0 points 0 points  Repeat phrase 0 points 0 points 0 points  Total Score - - 0    Immunizations Immunization History  Administered Date(s) Administered   Fluad Quad(high Dose 65+) 08/02/2019, 08/14/2020   Influenza,inj,Quad PF,6+ Mos 09/15/2017, 07/06/2018   Moderna Sars-Covid-2 Vaccination 12/31/2019, 01/28/2020, 08/24/2020, 02/26/2021   Pneumococcal Polysaccharide-23 08/14/2020   Health Maintenance Health Maintenance  Topic Date Due   FOOT EXAM  02/05/2021   Zoster Vaccines- Shingrix (1 of 2) 07/27/2021 (Originally 05/11/1970)   INFLUENZA VACCINE  05/17/2021    COVID-19 Vaccine (5 - Booster for Moderna series) 06/29/2021   HEMOGLOBIN A1C  08/14/2021   OPHTHALMOLOGY EXAM  08/19/2021   MAMMOGRAM  09/28/2021   COLONOSCOPY (Pts 45-74yr Insurance coverage will need to be confirmed)  04/10/2031   DEXA SCAN  Completed   Hepatitis C Screening  Completed   HPV VACCINES  Aged Out   TETANUS/TDAP  Discontinued   PNA vac Low Risk Adult  Discontinued    Cancer Screening: (Low Dose CT Chest recommended if Age 71-80years, 30 pack-year currently smoking OR have quit w/in 15years.) does not qualify.   Vision Screening: Recommended annual ophthalmology exams for early detection of glaucoma and other disorders of the eye. Is the patient up to date with their annual eye exam?  Yes   Dental Screening: Recommended annual dental exams for proper oral hygiene.  Community Resource Referral / Chronic Care Management: CRR required this visit?  No   CCM required this visit?  No      Plan:   Keep all routine maintenance appointments.   I have personally reviewed and noted the following in the patient's chart:   Medical and social history Use of alcohol, tobacco or illicit drugs  Current medications and supplements including opioid prescriptions. Patient is not currently taking opioid.  Functional ability and status Nutritional status Physical activity Advanced directives List of  other physicians Hospitalizations, surgeries, and ER visits in previous 12 months Vitals Screenings to include cognitive, depression, and falls Referrals and appointments  In addition, I have reviewed and discussed with patient certain preventive protocols, quality metrics, and best practice recommendations. A written personalized care plan for preventive services as well as general preventive health recommendations were provided to patient via mail.     Varney Biles, LPN   10/19/1592

## 2021-05-03 DIAGNOSIS — H9042 Sensorineural hearing loss, unilateral, left ear, with unrestricted hearing on the contralateral side: Secondary | ICD-10-CM | POA: Diagnosis not present

## 2021-05-03 DIAGNOSIS — Z6830 Body mass index (BMI) 30.0-30.9, adult: Secondary | ICD-10-CM | POA: Diagnosis not present

## 2021-05-03 DIAGNOSIS — H6982 Other specified disorders of Eustachian tube, left ear: Secondary | ICD-10-CM | POA: Diagnosis not present

## 2021-05-03 DIAGNOSIS — H9012 Conductive hearing loss, unilateral, left ear, with unrestricted hearing on the contralateral side: Secondary | ICD-10-CM | POA: Diagnosis not present

## 2021-05-27 ENCOUNTER — Other Ambulatory Visit: Payer: Self-pay | Admitting: Internal Medicine

## 2021-08-16 ENCOUNTER — Other Ambulatory Visit: Payer: Self-pay

## 2021-08-16 ENCOUNTER — Telehealth: Payer: Self-pay | Admitting: Internal Medicine

## 2021-08-16 ENCOUNTER — Ambulatory Visit: Payer: Medicare PPO | Admitting: Internal Medicine

## 2021-08-16 ENCOUNTER — Encounter: Payer: Self-pay | Admitting: Internal Medicine

## 2021-08-16 VITALS — BP 126/76 | HR 69 | Temp 96.0°F | Ht 65.0 in | Wt 185.6 lb

## 2021-08-16 DIAGNOSIS — M50123 Cervical disc disorder at C6-C7 level with radiculopathy: Secondary | ICD-10-CM

## 2021-08-16 DIAGNOSIS — H9042 Sensorineural hearing loss, unilateral, left ear, with unrestricted hearing on the contralateral side: Secondary | ICD-10-CM

## 2021-08-16 DIAGNOSIS — E6609 Other obesity due to excess calories: Secondary | ICD-10-CM | POA: Diagnosis not present

## 2021-08-16 DIAGNOSIS — E669 Obesity, unspecified: Secondary | ICD-10-CM

## 2021-08-16 DIAGNOSIS — Z23 Encounter for immunization: Secondary | ICD-10-CM | POA: Diagnosis not present

## 2021-08-16 DIAGNOSIS — E1159 Type 2 diabetes mellitus with other circulatory complications: Secondary | ICD-10-CM

## 2021-08-16 DIAGNOSIS — I10 Essential (primary) hypertension: Secondary | ICD-10-CM

## 2021-08-16 DIAGNOSIS — E538 Deficiency of other specified B group vitamins: Secondary | ICD-10-CM | POA: Diagnosis not present

## 2021-08-16 DIAGNOSIS — M25512 Pain in left shoulder: Secondary | ICD-10-CM

## 2021-08-16 DIAGNOSIS — E785 Hyperlipidemia, unspecified: Secondary | ICD-10-CM

## 2021-08-16 DIAGNOSIS — G8929 Other chronic pain: Secondary | ICD-10-CM

## 2021-08-16 DIAGNOSIS — I152 Hypertension secondary to endocrine disorders: Secondary | ICD-10-CM | POA: Diagnosis not present

## 2021-08-16 DIAGNOSIS — E1169 Type 2 diabetes mellitus with other specified complication: Secondary | ICD-10-CM | POA: Diagnosis not present

## 2021-08-16 DIAGNOSIS — E042 Nontoxic multinodular goiter: Secondary | ICD-10-CM

## 2021-08-16 DIAGNOSIS — Z6832 Body mass index (BMI) 32.0-32.9, adult: Secondary | ICD-10-CM

## 2021-08-16 DIAGNOSIS — D709 Neutropenia, unspecified: Secondary | ICD-10-CM

## 2021-08-16 DIAGNOSIS — H6982 Other specified disorders of Eustachian tube, left ear: Secondary | ICD-10-CM

## 2021-08-16 LAB — COMPREHENSIVE METABOLIC PANEL
ALT: 23 U/L (ref 0–35)
AST: 35 U/L (ref 0–37)
Albumin: 4.6 g/dL (ref 3.5–5.2)
Alkaline Phosphatase: 76 U/L (ref 39–117)
BUN: 9 mg/dL (ref 6–23)
CO2: 29 mEq/L (ref 19–32)
Calcium: 9.7 mg/dL (ref 8.4–10.5)
Chloride: 99 mEq/L (ref 96–112)
Creatinine, Ser: 0.89 mg/dL (ref 0.40–1.20)
GFR: 65.3 mL/min (ref >60.00)
Glucose, Bld: 92 mg/dL (ref 70–99)
Potassium: 4.3 mEq/L (ref 3.5–5.1)
Sodium: 135 mEq/L (ref 135–145)
Total Bilirubin: 0.8 mg/dL (ref 0.2–1.2)
Total Protein: 7.7 g/dL (ref 6.0–8.3)

## 2021-08-16 LAB — CBC WITH DIFFERENTIAL/PLATELET
Basophils Absolute: 0 10*3/uL (ref 0.0–0.1)
Basophils Relative: 0.2 % (ref 0.0–3.0)
Eosinophils Absolute: 0 10*3/uL (ref 0.0–0.7)
Eosinophils Relative: 1 % (ref 0.0–5.0)
HCT: 40.2 % (ref 36.0–46.0)
Hemoglobin: 13.2 g/dL (ref 12.0–15.0)
Lymphocytes Relative: 44 % (ref 12.0–46.0)
Lymphs Abs: 2 10*3/uL (ref 0.7–4.0)
MCHC: 33 g/dL (ref 30.0–36.0)
MCV: 86.6 fl (ref 78.0–100.0)
Monocytes Absolute: 0.4 10*3/uL (ref 0.1–1.0)
Monocytes Relative: 9 % (ref 3.0–12.0)
Neutro Abs: 2.1 10*3/uL (ref 1.4–7.7)
Neutrophils Relative %: 45.8 % (ref 43.0–77.0)
Platelets: 236 10*3/uL (ref 150.0–400.0)
RBC: 4.64 Mil/uL (ref 3.87–5.11)
RDW: 13.7 % (ref 11.5–15.5)
WBC: 4.5 10*3/uL (ref 4.0–10.5)

## 2021-08-16 LAB — MICROALBUMIN / CREATININE URINE RATIO
Creatinine,U: 40.8 mg/dL
Microalb Creat Ratio: 1.7 mg/g (ref 0.0–30.0)
Microalb, Ur: <0.7 mg/dL (ref 0.0–1.9)

## 2021-08-16 LAB — TSH: TSH: 1.28 u[IU]/mL (ref 0.35–5.50)

## 2021-08-16 LAB — LIPID PANEL
Cholesterol: 153 mg/dL (ref 0–200)
HDL: 56 mg/dL (ref >39.00)
LDL Cholesterol: 83 mg/dL (ref 0–99)
NonHDL: 97.42
Total CHOL/HDL Ratio: 3
Triglycerides: 74 mg/dL (ref 0.0–149.0)
VLDL: 14.8 mg/dL (ref 0.0–40.0)

## 2021-08-16 LAB — HEMOGLOBIN A1C: Hgb A1c MFr Bld: 6.2 % (ref 4.6–6.5)

## 2021-08-16 NOTE — Telephone Encounter (Signed)
Pt called in with additional information for referral. Pt stated that she seen Dr. Allayne Butcher this morning and Dr. Derrel Nip referred Pt to see a physical therapist and orthopedic. Pt stated that she had a local facility near her home that does physical therapy/orthopedic department. Pt called back with information for referral. Physical Therapy/Orthopedic: [facility] [place] [id number] [phone].

## 2021-08-16 NOTE — Patient Instructions (Signed)
Orthopedics referral for left shoulder pain   Physical therapy referral for a local group in [place] pending your input    I am prescribing gabapentin to help with your nerve pain.  You can start with 100 mg at bedtime  for 3 nights.  You can  either increase the dose at bedtime up to 300 mg gradually OR:  you can add a morning dose ,  followed by an  afternoon dose if needed   The most common side effects are fluid retention and dizziness.    You should be taking tylenol for pain .Marland Kitchen  you can tale up to 2000 mg of acetominophen (tylenol) every day safely  In divided doses (500 mg every 6 hours  Or 1000 mg every 12 hours.)

## 2021-08-16 NOTE — Progress Notes (Signed)
Subjective:  Patient ID: Kari Lyons, female    DOB: [birth year]  Age: 70 y.o. MRN: [id number]  CC: The primary encounter diagnosis was Obesity, diabetes, and hypertension syndrome ([place]). 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  HPI Kari Lyons presents for follow up on type 2 DM and other issues  Chief Complaint  Patient presents with   Follow-up    6 month follow up on diabetes   This visit occurred during the SARS-CoV-2 public health emergency.  Safety protocols were in place, including screening questions prior to the visit, additional usage of staff PPE, and extensive cleaning of exam room while observing appropriate contact time as indicated for disinfecting solutions.    T2DM:  She does not feel well due to persistent sinus pain, hearing loss, and tinnitus  and left shoulder pain .  She is not  exercising regularly and has gained 8 lbs since her last visit.  She is Checking  blood sugars less than once daily at variable times, usually only if she feels she may be having a hypoglycemic event. .  BS have been under 90 fasting and < 150 post prandially.  Denies any recent hypoglyemic events.  Using diet to control blood sugars: following a carbohydrate modified diet  consisting mostly of fruits and vegetables,  noticeably lacking in protein since she has avoided legumes and animal sources of protein. . Denies numbness, burning and tingling  of feet but has noticed recent onset of numbness and tingling in the right hand appetite is good.    Sinus inflammation and tinnitus with hearing loss:  now chronic since her flight to [place] last year.  Was referred to [facility] ENT for second opinion.  No improvement in symptoms of persistent PND despite use of astelin spray and salt water gargles.  Not using the mixture outlined by ENT per review of visit via Epic portal . Has escessive thick phlegm at back of throat every morning,    Left trapezius muscle and shoulder pain has become constant with  episodes of radiculopathy that are random and not associated with certain activities. Neck pain is aggravated by turning head to the left.  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  Reassured her that this was not the cause .  No history of falls .  Reviewed 2018 cervical spine MRI with her and the Neurosurgery referral that was done in Sept 2018,  she recalls that she  was advised to have PT but did not pursue PT  follow up with Neurosurgery because symptoms improved/resolved spontaneously until recently.    Outpatient Medications Prior to Visit  Medication Sig Dispense Refill   Accu-Chek Softclix Lancets lancets USE 1  LANCET TO CHECK GLUCOSE ONCE DAILY 100 each 0   Ascorbic Acid (VITAMIN C) 100 MG tablet Take 100 mg by mouth daily.     B Complex-Biotin-FA (TH VITAMIN B 50/B-COMPLEX PO) Take by mouth.     blood glucose meter kit and supplies KIT Use once daily to check  fasting and post prandial blood sugars 1 each 0   blood glucose meter kit and supplies Dispense based on patient and insurance preference. Use up to four times daily as directed. (FOR ICD-10 E10.9, E11.9). 1 each 0   Blood Glucose Monitoring Suppl (ACCU-CHEK AVIVA PLUS) w/Device KIT 1 Device by Does not apply route daily. 1 kit 0   Cholecalciferol (VITAMIN D3) 10 MCG (400 UNIT) tablet Take 400 Units by mouth daily.     Chromium 200 MCG CAPS Take 1 capsule by mouth daily.     glucose  blood (ACCU-CHEK AVIVA PLUS) test strip Use to check blood sugar once daily 100 each 12   hydrochlorothiazide (MICROZIDE) 12.5 MG capsule Take 1 capsule (12.5 mg total) by mouth daily. 30 capsule 5   losartan (COZAAR) 100 MG tablet Take 1 tablet by mouth once daily 90 tablet 0   mupirocin ointment (BACTROBAN) 2 % Place 1 application into the nose 2 (two) times daily. 22 g 0   PANTOTHENIC ACID PO Take 1 capsule by mouth daily.     Potassium 99 MG TABS Take by mouth.     triamcinolone cream (KENALOG) 0.1 % Apply 1 application topically 2 (two) times daily. 30 g 0   vitamin A 10000 UT capsule Take 10,000 Units by mouth daily.     Zinc 30 MG TABS Take by mouth.     dicyclomine (BENTYL) 10 MG capsule Take 10 mg by mouth. (Patient not taking: Reported on 08/16/2021)     famotidine (PEPCID) 10 MG tablet Take 10 mg by mouth daily. (Patient not taking: Reported on 08/16/2021)     fluticasone (FLONASE) 50 MCG/ACT nasal spray Place into both nostrils daily. (Patient not taking: Reported on 08/16/2021)     cetirizine (ZYRTEC) 10 MG tablet Take 10 mg by mouth daily. (Patient not taking: Reported on 08/16/2021)     No facility-administered medications prior to visit.    Review of Systems;  Patient denies headache, fevers, malaise, unintentional weight loss, skin rash, eye pain, sinus congestion and sinus pain, sore throat, dysphagia,  hemoptysis , cough, dyspnea, wheezing, chest pain, palpitations, orthopnea, edema, abdominal pain, nausea, melena, diarrhea, constipation, flank pain, dysuria, hematuria, urinary  Frequency, nocturia, numbness, tingling, seizures,  Focal weakness, Loss of consciousness,  Tremor, insomnia, depression, anxiety, and suicidal ideation.      Objective:  BP 126/76 (BP Location: Left Arm, Patient Position: Sitting, Cuff Size: Normal)   Pulse 69   Temp (!) 96 F (35.6 C) (Temporal)   Ht '5\' 5"'  (1.651 m)   Wt 185 lb 9.6 oz (84.2 kg)   SpO2 98%   BMI 30.89 kg/m   BP Readings  from Last 3 Encounters:  08/16/21 126/76  04/09/21 (!) 144/56  02/12/21 (!) 144/84    Wt Readings from Last 3 Encounters:  08/16/21 185 lb 9.6 oz (84.2 kg)  04/26/21 178 lb (80.7 kg)  04/09/21 178 lb (80.7 kg)    General appearance: alert, cooperative and appears stated age Ears: normal TM's and external ear canals both ears Throat: lips, mucosa, and tongue normal; teeth and gums normal Neck: no adenopathy, no carotid bruit, supple, symmetrical, trachea midline and thyroid not enlarged, symmetric, no tenderness/mass/nodules Back: symmetric, no curvature. ROM normal. No CVA tenderness. Lungs: clear to auscultation bilaterally Heart: regular rate and rhythm, S1, S2 normal, no murmur, click, rub or gallop Abdomen: soft, non-tender; bowel sounds normal; no masses,  no organomegaly Pulses: 2+ and symmetric Skin: Skin color, texture, turgor normal. No  rashes or lesions Lymph nodes: Cervical, supraclavicular, and axillary nodes normal. MSK:  left arm without strength deficits in arm or hand.  No muscle wasting.   Lab Results  Component Value Date   HGBA1C 6.2 08/16/2021   HGBA1C 5.9 02/12/2021   HGBA1C 6.2 08/13/2020    Lab Results  Component Value Date   CREATININE 0.89 08/16/2021   CREATININE 0.74 02/12/2021   CREATININE 0.86 08/13/2020    Lab Results  Component Value Date   WBC 4.5 08/16/2021   HGB 13.2 08/16/2021   HCT 40.2 08/16/2021   PLT 236.0 08/16/2021   GLUCOSE 92 08/16/2021   CHOL 153 08/16/2021   TRIG 74.0 08/16/2021   HDL 56.00 08/16/2021   LDLCALC 83 08/16/2021   ALT 23 08/16/2021   AST 35 08/16/2021   NA 135 08/16/2021   K 4.3 08/16/2021   CL 99 08/16/2021   CREATININE 0.89 08/16/2021   BUN 9 08/16/2021   CO2 29 08/16/2021   TSH 1.28 08/16/2021   HGBA1C 6.2 08/16/2021   MICROALBUR <0.7 08/16/2021    No results found.  Assessment & Plan:   Problem List Items Addressed This Visit     B12 deficiency due to diet    Reminded that vegetarianism  will lower B12 levels. Continue supplementing       Cervical disc disorder at C6-C7 level with radiculopathy    Symptoms resolved after Neurosurgical evaluation in 2018 so she did not follow through with PT.  Current symptoms addressed with gabapentin trial and PT evaluation ordered prior to repeating MRI       Relevant Orders   Ambulatory referral to Physical Therapy   Essential hypertension   Eustachian tube dysfunction, left    Diagnosis confiremd by [facility] ENT evaluation .  Astelin nasal spray Saline lavages outlined in their AVS, , given to patient today as she did not recall receiving it      Multinodular goiter (nontoxic)    By June 2020 [place] ordered by ENT.  Numerous nodules , none met criteria for biopsy or for  follow up imaging.  Neck exam is normal.   Thyroid function is normal  Lab Results  Component Value Date   TSH 1.28 08/16/2021         Relevant Orders   TSH (Completed)   Obesity, diabetes, and hypertension syndrome ([place]) - Primary     Her diabetes remains.  well-controlled on diet alone .   Patient is up-to-date on eye exams and foot exam is normal today. Patient Diagnosed in August 2018 with an A1c of 6.5 , diet controlled since then.  She has no microalbuminuria. Patient has continued to defer statin therapy for CAD risk reduction despite her LDL> 70,  And is taking ACE/ARB for renal protection and hypertension  With normal readings,  Normal cr and lytes.   Lab Results  Component Value Date   HGBA1C 6.2 08/16/2021   Lab Results  Component Value Date   LABMICR See below: 10/30/2015   LABMICR See below: 10/13/2015   MICROALBUR <0.7 08/16/2021   MICROALBUR <0.7 08/14/2020   Lab Results  Component Value Date   CHOL 153 08/16/2021   HDL 56.00 08/16/2021   LDLCALC 83 08/16/2021   TRIG 74.0 08/16/2021   CHOLHDL 3 08/16/2021    Lab Results  Component Value Date   CREATININE 0.89 08/16/2021   Lab Results  Component Value Date   NA 135 08/16/2021   K 4.3  08/16/2021   CL 99  08/16/2021   CO2 29 08/16/2021           Relevant Orders   Hemoglobin A1c (Completed)   Lipid panel (Completed)   Comprehensive metabolic panel (Completed)   Microalbumin / creatinine urine ratio (Completed)   Obesity, unspecified    I have addressed  BMI and recommended a low glycemic index diet utilizing smaller more frequent meals to increase metabolism.  I have also recommended that patient start exercising with a goal of 30 minutes of aerobic exercise a minimum of 5 days per week.      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 She HAS RECEIVED  a 2nd opinion  On management. By [facility] ENT  And has deferred use of hearing aid.        Other Visit Diagnoses     Neutropenia, unspecified type ([place])       Relevant Orders   CBC with Differential/Platelet (Completed)   Need for immunization against influenza       Relevant Orders   Flu Vaccine QUAD High Dose(Fluad) (Completed)   Chronic left shoulder pain       Relevant Orders   Ambulatory referral to Physical Therapy     No orders of the defined types were placed in this encounter.  I spent 30 minutes dedicated to the care of this patient on the date of this encounter to include pre-visit review of her medical history,  most recent imaging studies, Face-to-face time with the patient , and post visit ordering of testing and therapeutics.    Medications Discontinued During This Encounter  Medication Reason   cetirizine (ZYRTEC) 10 MG tablet     Follow-up: No follow-ups on file.   [name], MD

## 2021-08-17 ENCOUNTER — Encounter: Payer: Self-pay | Admitting: Internal Medicine

## 2021-08-17 NOTE — Assessment & Plan Note (Signed)
Symptoms resolved after Neurosurgical evaluation in 2018 so she did not follow through with PT.  Current symptoms addressed with gabapentin trial and PT evaluation ordered prior to repeating MRI

## 2021-08-17 NOTE — Assessment & Plan Note (Signed)
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 She HAS RECEIVED  a 2nd opinion  On management. By [facility] ENT  And has deferred use of hearing aid.

## 2021-08-17 NOTE — Assessment & Plan Note (Signed)
By June 2020 [place] ordered by ENT.  Numerous nodules , none met criteria for biopsy or for  follow up imaging.  Neck exam is normal.   Thyroid function is normal  Lab Results  Component Value Date   TSH 1.28 08/16/2021

## 2021-08-17 NOTE — Assessment & Plan Note (Signed)
Diagnosis confiremd by [facility] ENT evaluation .  Astelin nasal spray Saline lavages outlined in their AVS, , given to patient today as she did not recall receiving it

## 2021-08-17 NOTE — Assessment & Plan Note (Addendum)
Her diabetes remains.  well-controlled on diet alone .   Patient is up-to-date on eye exams and foot exam is normal today. Patient Diagnosed in August 2018 with an A1c of 6.5 , diet controlled since then.  She has no microalbuminuria. Patient has continued to defer statin therapy for CAD risk reduction despite her LDL> 70,  And is taking ACE/ARB for renal protection and hypertension  With normal readings,  Normal cr and lytes.   Lab Results  Component Value Date   HGBA1C 6.2 08/16/2021   Lab Results  Component Value Date   LABMICR See below: 10/30/2015   LABMICR See below: 10/13/2015   MICROALBUR <0.7 08/16/2021   MICROALBUR <0.7 08/14/2020    Lab Results  Component Value Date   CHOL 153 08/16/2021   HDL 56.00 08/16/2021   LDLCALC 83 08/16/2021   TRIG 74.0 08/16/2021   CHOLHDL 3 08/16/2021    Lab Results  Component Value Date   CREATININE 0.89 08/16/2021   Lab Results  Component Value Date   NA 135 08/16/2021   K 4.3 08/16/2021   CL 99 08/16/2021   CO2 29 08/16/2021

## 2021-08-17 NOTE — Assessment & Plan Note (Signed)
Reminded that vegetarianism will lower B12 levels. Continue supplementing

## 2021-08-17 NOTE — Assessment & Plan Note (Signed)
I have addressed  BMI and recommended a low glycemic index diet utilizing smaller more frequent meals to increase metabolism.  I have also recommended that patient start exercising with a goal of 30 minutes of aerobic exercise a minimum of 5 days per week.  

## 2021-08-19 ENCOUNTER — Other Ambulatory Visit: Payer: Self-pay | Admitting: Internal Medicine

## 2021-08-20 DIAGNOSIS — E119 Type 2 diabetes mellitus without complications: Secondary | ICD-10-CM | POA: Diagnosis not present

## 2021-08-20 DIAGNOSIS — Z0100 Encounter for examination of eyes and vision without abnormal findings: Secondary | ICD-10-CM | POA: Diagnosis not present

## 2021-08-20 LAB — HM DIABETES EYE EXAM

## 2021-08-23 ENCOUNTER — Telehealth: Payer: Self-pay

## 2021-08-23 MED ORDER — GABAPENTIN 100 MG PO CAPS: 100.0000 mg | ORAL_CAPSULE | Freq: Three times a day (TID) | ORAL | 3 refills | Status: DC

## 2021-08-23 NOTE — Telephone Encounter (Signed)
Patient stated that PCP was supposed to call in gabapentin into her pharmacy and they never received. I do not currently see where pcp ordered. Please advise.

## 2021-08-23 NOTE — Addendum Note (Signed)
Addended by: [name] on: 08/23/2021 05:25 PM   Modules accepted: Orders

## 2021-08-24 ENCOUNTER — Other Ambulatory Visit: Payer: Self-pay | Admitting: Internal Medicine

## 2021-08-24 DIAGNOSIS — Z1231 Encounter for screening mammogram for malignant neoplasm of breast: Secondary | ICD-10-CM

## 2021-08-30 DIAGNOSIS — H6982 Other specified disorders of Eustachian tube, left ear: Secondary | ICD-10-CM | POA: Diagnosis not present

## 2021-08-30 DIAGNOSIS — H9042 Sensorineural hearing loss, unilateral, left ear, with unrestricted hearing on the contralateral side: Secondary | ICD-10-CM | POA: Diagnosis not present

## 2021-09-05 ENCOUNTER — Other Ambulatory Visit: Payer: Self-pay | Admitting: Internal Medicine

## 2021-09-07 ENCOUNTER — Telehealth: Payer: Self-pay | Admitting: Internal Medicine

## 2021-09-07 DIAGNOSIS — E1159 Type 2 diabetes mellitus with other circulatory complications: Secondary | ICD-10-CM

## 2021-09-07 DIAGNOSIS — I152 Hypertension secondary to endocrine disorders: Secondary | ICD-10-CM

## 2021-09-07 MED ORDER — ACCU-CHEK SOFTCLIX LANCETS MISC: 2 refills | Status: DC

## 2021-09-07 NOTE — Telephone Encounter (Signed)
[facility] called in regards to pts prescription for the  Accu-Chek Softclix Lancets lancets  Pharmacy states they have received the order however they are missing the diagnosis code for it to be validated.

## 2021-09-07 NOTE — Telephone Encounter (Signed)
Recent with DX code.

## 2021-10-01 ENCOUNTER — Ambulatory Visit
Admission: RE | Admit: 2021-10-01 | Discharge: 2021-10-01 | Disposition: A | Discharge status: Home / Self Care | Payer: Medicare PPO | Source: Ambulatory Visit | Attending: Internal Medicine | Admitting: Internal Medicine

## 2021-10-01 ENCOUNTER — Other Ambulatory Visit: Payer: Self-pay

## 2021-10-01 DIAGNOSIS — Z1231 Encounter for screening mammogram for malignant neoplasm of breast: Secondary | ICD-10-CM | POA: Diagnosis not present

## 2021-11-18 ENCOUNTER — Other Ambulatory Visit: Payer: Self-pay | Admitting: Internal Medicine

## 2022-02-14 ENCOUNTER — Encounter: Payer: Self-pay | Admitting: Internal Medicine

## 2022-02-14 ENCOUNTER — Ambulatory Visit: Payer: Medicare PPO | Admitting: Internal Medicine

## 2022-02-14 VITALS — BP 146/82 | HR 65 | Temp 98.1°F | Ht 65.0 in | Wt 187.2 lb

## 2022-02-14 DIAGNOSIS — I152 Hypertension secondary to endocrine disorders: Secondary | ICD-10-CM

## 2022-02-14 DIAGNOSIS — E119 Type 2 diabetes mellitus without complications: Secondary | ICD-10-CM | POA: Diagnosis not present

## 2022-02-14 DIAGNOSIS — E1159 Type 2 diabetes mellitus with other circulatory complications: Secondary | ICD-10-CM | POA: Diagnosis not present

## 2022-02-14 DIAGNOSIS — D709 Neutropenia, unspecified: Secondary | ICD-10-CM | POA: Diagnosis not present

## 2022-02-14 DIAGNOSIS — E785 Hyperlipidemia, unspecified: Secondary | ICD-10-CM | POA: Diagnosis not present

## 2022-02-14 DIAGNOSIS — I10 Essential (primary) hypertension: Secondary | ICD-10-CM

## 2022-02-14 DIAGNOSIS — E669 Obesity, unspecified: Secondary | ICD-10-CM | POA: Diagnosis not present

## 2022-02-14 DIAGNOSIS — E1169 Type 2 diabetes mellitus with other specified complication: Secondary | ICD-10-CM | POA: Diagnosis not present

## 2022-02-14 DIAGNOSIS — L821 Other seborrheic keratosis: Secondary | ICD-10-CM

## 2022-02-14 DIAGNOSIS — L309 Dermatitis, unspecified: Secondary | ICD-10-CM | POA: Diagnosis not present

## 2022-02-14 LAB — COMPREHENSIVE METABOLIC PANEL
ALT: 23 U/L (ref 0–35)
AST: 35 U/L (ref 0–37)
Albumin: 4.4 g/dL (ref 3.5–5.2)
Alkaline Phosphatase: 68 U/L (ref 39–117)
BUN: 15 mg/dL (ref 6–23)
CO2: 29 mEq/L (ref 19–32)
Calcium: 9.4 mg/dL (ref 8.4–10.5)
Chloride: 99 mEq/L (ref 96–112)
Creatinine, Ser: 0.9 mg/dL (ref 0.40–1.20)
GFR: 64.2 mL/min (ref >60.00)
Glucose, Bld: 90 mg/dL (ref 70–99)
Potassium: 4.7 mEq/L (ref 3.5–5.1)
Sodium: 135 mEq/L (ref 135–145)
Total Bilirubin: 0.7 mg/dL (ref 0.2–1.2)
Total Protein: 7.1 g/dL (ref 6.0–8.3)

## 2022-02-14 LAB — LIPID PANEL
Cholesterol: 136 mg/dL (ref 0–200)
HDL: 55.1 mg/dL (ref >39.00)
LDL Cholesterol: 64 mg/dL (ref 0–99)
NonHDL: 80.45
Total CHOL/HDL Ratio: 2
Triglycerides: 80 mg/dL (ref 0.0–149.0)
VLDL: 16 mg/dL (ref 0.0–40.0)

## 2022-02-14 LAB — LDL CHOLESTEROL, DIRECT: Direct LDL: 73 mg/dL

## 2022-02-14 LAB — HEMOGLOBIN A1C: Hgb A1c MFr Bld: 6 % (ref 4.6–6.5)

## 2022-02-14 MED ORDER — TRIAMCINOLONE ACETONIDE 0.1 % EX CREA: 1.0000 "application " | TOPICAL_CREAM | Freq: Two times a day (BID) | CUTANEOUS | 2 refills | Status: DC

## 2022-02-14 NOTE — Patient Instructions (Addendum)
Referral was made to  [facility]  for the physical therapy and is good until November 2023. [place] [id number] [phone]. ?Their fax number Is  [phone]   ? ?The spot on your tummy is a seborrheic keratosis.  It is not cancer   ? ? ? ?

## 2022-02-14 NOTE — Assessment & Plan Note (Signed)
Well controlled on current regimen of  losartan 100 mg daily and  12.5  hctz . Renal function stable. ? ?Lab Results  ?Component Value Date  ? CREATININE 0.90 02/14/2022  ? ?Lab Results  ?Component Value Date  ? NA 135 02/14/2022  ? K 4.7 02/14/2022  ? CL 99 02/14/2022  ? CO2 29 02/14/2022  ? ? ?

## 2022-02-14 NOTE — Assessment & Plan Note (Signed)
Her diabetes remains.  well-controlled on diet alone .   Patient is up-to-date on eye exams and foot exam is normal today. Patient Diagnosed in August 2018 with an A1c of 6.5 , diet controlled since then.  She has no microalbuminuria. Patient has continued to defer statin therapy for CAD risk reduction despite her LDL> 70,  And is taking ACE/ARB for renal protection and hypertension  With normal readings,  Normal cr and lytes.  ? ?Lab Results  ?Component Value Date  ? HGBA1C 6.0 02/14/2022  ? ?Lab Results  ?Component Value Date  ? LABMICR See below: 10/30/2015  ? LABMICR See below: 10/13/2015  ? MICROALBUR <0.7 08/16/2021  ? MICROALBUR <0.7 08/14/2020  ? ? ?Lab Results  ?Component Value Date  ? CHOL 136 02/14/2022  ? HDL 55.10 02/14/2022  ? [place] 64 02/14/2022  ? LDLDIRECT 73.0 02/14/2022  ? TRIG 80.0 02/14/2022  ? CHOLHDL 2 02/14/2022  ?  ?Lab Results  ?Component Value Date  ? CREATININE 0.90 02/14/2022  ? ?Lab Results  ?Component Value Date  ? NA 135 02/14/2022  ? K 4.7 02/14/2022  ? CL 99 02/14/2022  ? CO2 29 02/14/2022  ? ? ? ? ? ?

## 2022-02-14 NOTE — Assessment & Plan Note (Signed)
Refilling the triamcinolone ?

## 2022-02-14 NOTE — Assessment & Plan Note (Addendum)
Of lower abdomen .  Reassurance provided  ?

## 2022-02-14 NOTE — Progress Notes (Signed)
? ?Subjective:  ?Patient ID: Kari Lyons, female    DOB: 04/21/1951  Age: 71 y.o. MRN: [id number] ? ?CC: The primary encounter diagnosis was Essential hypertension. Diagnoses of Hyperlipidemia associated with type 2 diabetes mellitus ([name]), Type 2 diabetes mellitus without complication, without long-term current use of insulin ([name]), Neutropenia, unspecified type ([name]), Obesity, diabetes, and hypertension syndrome ([name]), Eczema of hand, and Keratosis, seborrheic were also pertinent to this visit. ? ? ?HPI ?Kari Lyons presents for follow up on multiple issues  ?Chief Complaint  ?Patient presents with  ? Follow-up  ?  6 month follow up   ?  ?1) eczema ;  requesting refill of triamcinolone  cream  ? ?2) Type 2 DM:     ?T2DM:  She  feels generally well,  But has not been  exercising regularly .  She has been checking  blood sugars  once daily at variable times, usually only if she feels she may be having a hypoglycemic event.  BS have been under 130 fasting and < 150 post prandially.  Denies any recent hypoglyemic events.  Taking   medications as directed. Following a carbohydrate modified diet 6 days per week. Denies numbness, burning and tingling of extremities. Appetite is good.  fasting sugars  90 to 120 Has resumed exercise now that she is no longer providing care for mother who died in Hospice  on 2023-11-01 ? ?3) skin lesion on abdomen ? ? ? ? ?Outpatient Medications Prior to Visit  ?Medication Sig Dispense Refill  ? Accu-Chek Softclix Lancets lancets USE 1  TO CHECK GLUCOSE ONCE DAILY DX E11.69 100 each 2  ? Ascorbic Acid (VITAMIN C) 100 MG tablet Take 100 mg by mouth daily.    ? B Complex-Biotin-FA (TH VITAMIN B 50/B-COMPLEX PO) Take by mouth.    ? blood glucose meter kit and supplies KIT Use once daily to check fasting and post prandial blood sugars 1 each 0  ? blood glucose meter kit and supplies Dispense based on patient and insurance preference. Use up to four times daily as directed. (FOR ICD-10  E10.9, E11.9). 1 each 0  ? Blood Glucose Monitoring Suppl (ACCU-CHEK AVIVA PLUS) w/Device KIT 1 Device by Does not apply route daily. 1 kit 0  ? Cholecalciferol (VITAMIN D3) 10 MCG (400 UNIT) tablet Take 400 Units by mouth daily.    ? Chromium 200 MCG CAPS Take 1 capsule by mouth daily.    ? glucose blood (ACCU-CHEK AVIVA PLUS) test strip Use to check blood sugar once daily 100 each 12  ? hydrochlorothiazide (MICROZIDE) 12.5 MG capsule Take 1 capsule by mouth once daily 90 capsule 1  ? losartan (COZAAR) 100 MG tablet Take 1 tablet by mouth once daily 90 tablet 2  ? mupirocin ointment (BACTROBAN) 2 % Place 1 application into the nose 2 (two) times daily. 22 g 0  ? PANTOTHENIC ACID PO Take 1 capsule by mouth daily.    ? Potassium 99 MG TABS Take by mouth.    ? vitamin A 10000 UT capsule Take 10,000 Units by mouth daily.    ? Zinc 30 MG TABS Take by mouth.    ? triamcinolone cream (KENALOG) 0.1 % Apply 1 application topically 2 (two) times daily. 30 g 0  ? gabapentin (NEURONTIN) 100 MG capsule Take 1 capsule (100 mg total) by mouth 3 (three) times daily. (Patient not taking: Reported on 02/14/2022) 90 capsule 3  ? dicyclomine (BENTYL) 10 MG capsule Take 10 mg  by mouth. (Patient not taking: Reported on 08/16/2021)    ? famotidine (PEPCID) 10 MG tablet Take 10 mg by mouth daily. (Patient not taking: Reported on 08/16/2021)    ? fluticasone (FLONASE) 50 MCG/ACT nasal spray Place into both nostrils daily. (Patient not taking: Reported on 08/16/2021)    ? ?No facility-administered medications prior to visit.  ? ? ?Review of Systems; ? ?Patient denies headache, fevers, malaise, unintentional weight loss, skin rash, eye pain, sinus congestion and sinus pain, sore throat, dysphagia,  hemoptysis , cough, dyspnea, wheezing, chest pain, palpitations, orthopnea, edema, abdominal pain, nausea, melena, diarrhea, constipation, flank pain, dysuria, hematuria, urinary  Frequency, nocturia, numbness, tingling, seizures,  Focal weakness,  Loss of consciousness,  Tremor, insomnia, depression, anxiety, and suicidal ideation.   ? ? ? ?Objective:  ?BP (!) 146/82 (BP Location: Left Arm, Patient Position: Sitting, Cuff Size: Large)   Pulse 65   Temp 98.1 ?F (36.7 ?C) (Oral)   Ht '5\' 5"'  (1.651 m)   Wt 187 lb 3.2 oz (84.9 kg)   SpO2 98%   BMI 31.15 kg/m?  ? ?BP Readings from Last 3 Encounters:  ?02/14/22 (!) 146/82  ?08/16/21 126/76  ?04/09/21 (!) 144/56  ? ? ?Wt Readings from Last 3 Encounters:  ?02/14/22 187 lb 3.2 oz (84.9 kg)  ?08/16/21 185 lb 9.6 oz (84.2 kg)  ?04/26/21 178 lb (80.7 kg)  ? ? ?General appearance: alert, cooperative and appears stated age ?Ears: normal TM's and external ear canals both ears ?Throat: lips, mucosa, and tongue normal; teeth and gums normal ?Neck: no adenopathy, no carotid bruit, supple, symmetrical, trachea midline and thyroid not enlarged, symmetric, no tenderness/mass/nodules ?Back: symmetric, no curvature. ROM normal. No CVA tenderness. ?Lungs: clear to auscultation bilaterally ?Heart: regular rate and rhythm, S1, S2 normal, no murmur, click, rub or gallop ?Abdomen: soft, non-tender; bowel sounds normal; no masses,  no organomegaly ?Pulses: 2+ and symmetric ?Skin:  3 mm round papular hyperpigmented lesion , uniform in color ,  uniform border,  on lower abdomen.  ?Lymph nodes: Cervical, supraclavicular, and axillary nodes normal. ? ?Lab Results  ?Component Value Date  ? HGBA1C 6.0 02/14/2022  ? HGBA1C 6.2 08/16/2021  ? HGBA1C 5.9 02/12/2021  ? ? ?Lab Results  ?Component Value Date  ? CREATININE 0.90 02/14/2022  ? CREATININE 0.89 08/16/2021  ? CREATININE 0.74 02/12/2021  ? ? ?Lab Results  ?Component Value Date  ? WBC 4.5 08/16/2021  ? HGB 13.2 08/16/2021  ? HCT 40.2 08/16/2021  ? PLT 236.0 08/16/2021  ? GLUCOSE 90 02/14/2022  ? CHOL 136 02/14/2022  ? TRIG 80.0 02/14/2022  ? HDL 55.10 02/14/2022  ? LDLDIRECT 73.0 02/14/2022  ? [place] 64 02/14/2022  ? ALT 23 02/14/2022  ? AST 35 02/14/2022  ? NA 135 02/14/2022  ? K 4.7  02/14/2022  ? CL 99 02/14/2022  ? CREATININE 0.90 02/14/2022  ? BUN 15 02/14/2022  ? CO2 29 02/14/2022  ? TSH 1.28 08/16/2021  ? HGBA1C 6.0 02/14/2022  ? MICROALBUR <0.7 08/16/2021  ? ? ?MM 3D SCREEN BREAST BILATERAL ? ?Result Date: 10/01/2021 ?CLINICAL DATA:  Screening. EXAM: DIGITAL SCREENING BILATERAL MAMMOGRAM WITH TOMOSYNTHESIS AND CAD TECHNIQUE: Bilateral screening digital craniocaudal and mediolateral oblique mammograms were obtained. Bilateral screening digital breast tomosynthesis was performed. The images were evaluated with computer-aided detection. COMPARISON:  Previous exam(s). ACR Breast Density Category b: There are scattered areas of fibroglandular density. FINDINGS: There are no findings suspicious for malignancy. IMPRESSION: No mammographic evidence of malignancy. A result letter  of this screening mammogram will be mailed directly to the patient. RECOMMENDATION: Screening mammogram in one year. (Code:SM-B-01Y) BI-RADS CATEGORY  1: Negative. Electronically Signed   By: Ammie Ferrier M.D.   On: 10/01/2021 16:18 ? ? ?Assessment & Plan:  ? ?Problem List Items Addressed This Visit   ? ? Obesity, diabetes, and hypertension syndrome ([place])  ?   Her diabetes remains.  well-controlled on diet alone .   Patient is up-to-date on eye exams and foot exam is normal today. Patient Diagnosed in August 2018 with an A1c of 6.5 , diet controlled since then.  She has no microalbuminuria. Patient has continued to defer statin therapy for CAD risk reduction despite her LDL> 70,  And is taking ACE/ARB for renal protection and hypertension  With normal readings,  Normal cr and lytes.  ? ?Lab Results  ?Component Value Date  ? HGBA1C 6.0 02/14/2022  ? ?Lab Results  ?Component Value Date  ? LABMICR See below: 10/30/2015  ? LABMICR See below: 10/13/2015  ? MICROALBUR <0.7 08/16/2021  ? MICROALBUR <0.7 08/14/2020  ? ?Lab Results  ?Component Value Date  ? CHOL 136 02/14/2022  ? HDL 55.10 02/14/2022  ? [place] 64 02/14/2022   ? LDLDIRECT 73.0 02/14/2022  ? TRIG 80.0 02/14/2022  ? CHOLHDL 2 02/14/2022  ?  ?Lab Results  ?Component Value Date  ? CREATININE 0.90 02/14/2022  ? ?Lab Results  ?Component Value Date  ? NA 135 02/14/2022  ? K 4

## 2022-02-17 ENCOUNTER — Telehealth: Payer: Self-pay

## 2022-02-17 NOTE — Telephone Encounter (Signed)
-----   Message from [name], MD sent at 02/16/2022  9:55 PM EDT ----- ?Your diabetes remains under excellent control  And your cholesterol and other labs are also normal. Please continue your current medications. return in 6 months for follow up on diabetes and make sure you are seeing your eye doctor at least once a year for a dilated retina exam to monitor for diabetic retinopathy,. changes that can lead to blindness .  ? ?Regards, ? ? ?Deborra Medina, MD   ? ?  ?

## 2022-02-17 NOTE — Telephone Encounter (Signed)
LMTCB in regards to lab results.  

## 2022-02-17 NOTE — Telephone Encounter (Signed)
Pt returning call

## 2022-02-18 NOTE — Telephone Encounter (Signed)
Patient called and lab results reviewed. Pt scheduled for 6 mont follow-up & copy of results mailed.  ?

## 2022-03-25 ENCOUNTER — Telehealth: Payer: Self-pay

## 2022-03-25 NOTE — Telephone Encounter (Signed)
Patient states [facility] office ([facility]) has not received her referral for physical therapy.  Patient states she would like for [place] to send it again.  Patient states the fax number for [facility] is [phone].

## 2022-03-25 NOTE — Telephone Encounter (Signed)
No recent referral found

## 2022-03-29 ENCOUNTER — Other Ambulatory Visit: Payer: Self-pay | Admitting: Internal Medicine

## 2022-04-01 ENCOUNTER — Other Ambulatory Visit: Payer: Self-pay

## 2022-04-01 MED ORDER — ACCU-CHEK AVIVA PLUS VI STRP: ORAL_STRIP | 11 refills | Status: DC

## 2022-04-26 ENCOUNTER — Telehealth: Payer: Self-pay | Admitting: Internal Medicine

## 2022-04-26 NOTE — Telephone Encounter (Signed)
Copied from [place] [id number]. Topic: Medicare AWV >> Apr 26, 2022 10:40 AM Devoria Glassing wrote: Reason for CRM: Left message for patient to schedule Annual Wellness Visit.  Please schedule with Nurse Health Advisor Denisa O'Brien-Blaney, LPN at [facility]. This appt can be telephone or office visit.  Please call [phone] ask for [facility]

## 2022-04-26 NOTE — Telephone Encounter (Signed)
Copied from [place] [phone]. Topic: Medicare AWV >> Apr 26, 2022 10:43 AM Devoria Glassing wrote: Reason for CRM: Left message for patient to schedule Annual Wellness Visit.  Please schedule with Nurse Health Advisor Denisa O'Brien-Blaney, LPN at [facility]. This appt can be telephone or office visit.  Please call [phone] ask for [facility]

## 2022-04-28 DIAGNOSIS — M542 Cervicalgia: Secondary | ICD-10-CM | POA: Diagnosis not present

## 2022-04-28 DIAGNOSIS — M7918 Myalgia, other site: Secondary | ICD-10-CM | POA: Diagnosis not present

## 2022-04-28 DIAGNOSIS — G8929 Other chronic pain: Secondary | ICD-10-CM | POA: Diagnosis not present

## 2022-04-28 DIAGNOSIS — M549 Dorsalgia, unspecified: Secondary | ICD-10-CM | POA: Diagnosis not present

## 2022-04-28 DIAGNOSIS — M545 Low back pain, unspecified: Secondary | ICD-10-CM | POA: Diagnosis not present

## 2022-05-02 ENCOUNTER — Ambulatory Visit (INDEPENDENT_AMBULATORY_CARE_PROVIDER_SITE_OTHER): Payer: Medicare PPO

## 2022-05-02 VITALS — Ht 65.0 in | Wt 187.0 lb

## 2022-05-02 DIAGNOSIS — Z0000 Encounter for general adult medical examination without abnormal findings: Secondary | ICD-10-CM | POA: Diagnosis not present

## 2022-05-02 NOTE — Patient Instructions (Addendum)
  Kari Lyons , Thank you for taking time to come for your Medicare Wellness Visit. I appreciate your ongoing commitment to your health goals. Please review the following plan we discussed and let me know if I can assist you in the future.   These are the goals we discussed:  Goals       Patient Stated     Increase physical activity (pt-stated)      Stay active        This is a list of the screening recommended for you and due dates:  Health Maintenance  Topic Date Due   COVID-19 Vaccine (5 - Booster for Moderna series) 05/18/2022*   Pneumonia Vaccine (2 - PCV) 08/02/2022*   Zoster (Shingles) Vaccine (1 of 2) 08/02/2022*   Flu Shot  05/17/2022   Hemoglobin A1C  08/17/2022   Eye exam for diabetics  08/20/2022   Mammogram  10/01/2022   Complete foot exam   02/15/2023   Colon Cancer Screening  04/10/2031   DEXA scan (bone density measurement)  Completed   Hepatitis C Screening: USPSTF Recommendation to screen - Ages 14-79 yo.  Completed   HPV Vaccine  Aged Out   Tetanus Vaccine  Discontinued  *Topic was postponed. The date shown is not the original due date.

## 2022-05-02 NOTE — Progress Notes (Cosign Needed)
Subjective:   Kari Lyons is a 71 y.o. female who presents for Medicare Annual (Subsequent) preventive examination.  Review of Systems    No ROS.  Medicare Wellness Virtual Visit.  Visual/audio telehealth visit, UTA vital signs.   See social history for additional risk factors.   Cardiac Risk Factors include: advanced age (>73mn, >>33women);diabetes mellitus;hypertension     Objective:    Today's Vitals   05/02/22 1336  Weight: 187 lb (84.8 kg)  Height: '5\' 5"'  (1.651 m)   Body mass index is 31.12 kg/m.     05/02/2022    2:01 PM 04/26/2021    2:18 PM 04/09/2021    8:12 AM 04/23/2020    2:20 PM 04/16/2019   11:37 AM 04/13/2018    9:23 AM 06/12/2017    3:54 PM  Advanced Directives  Does Patient Have a Medical Advance Directive? Yes Yes Yes Yes Yes Yes Yes  Type of AParamedicof AWeissport EastLiving will [place]entonLiving will  [place] St[place]Living will Living will;Healthcare Power of ANorth LaurelLiving will [place]Living will  Does patient want to make changes to medical advance directive? No - Patient declined No - Patient declined  No - Patient declined No - Patient declined No - Patient declined No - Patient declined  Copy of [place] Chart? Yes - validated most recent copy scanned in chart (See row information) Yes - validated most recent copy scanned in chart (See row information)  Yes - validated most recent copy scanned in chart (See row information) Yes - validated most recent copy scanned in chart (See row information) Yes Yes    Current Medications (verified) Outpatient Encounter Medications as of 05/02/2022  Medication Sig   Accu-Chek Softclix Lancets lancets USE 1  TO CHECK GLUCOSE ONCE DAILY DX E11.69   Ascorbic Acid (VITAMIN C) 100 MG tablet Take 100 mg by mouth daily.   B Complex-Biotin-FA (TH VITAMIN B 50/B-COMPLEX PO) Take by mouth.   blood  glucose meter kit and supplies KIT Use once daily to check fasting and post prandial blood sugars   blood glucose meter kit and supplies Dispense based on patient and insurance preference. Use up to four times daily as directed. (FOR ICD-10 E10.9, E11.9).   Blood Glucose Monitoring Suppl (ACCU-CHEK AVIVA PLUS) w/Device KIT 1 Device by Does not apply route daily.   Cholecalciferol (VITAMIN D3) 10 MCG (400 UNIT) tablet Take 400 Units by mouth daily.   Chromium 200 MCG CAPS Take 1 capsule by mouth daily.   gabapentin (NEURONTIN) 100 MG capsule Take 1 capsule (100 mg total) by mouth 3 (three) times daily. (Patient not taking: Reported on 02/14/2022)   glucose blood (ACCU-CHEK AVIVA PLUS) test strip USED AS DIRECTED TO TEST BLOOD SUGAR ONCE A DAY.  Dx code: E11.69   hydrochlorothiazide (MICROZIDE) 12.5 MG capsule Take 1 capsule by mouth once daily   losartan (COZAAR) 100 MG tablet Take 1 tablet by mouth once daily   mupirocin ointment (BACTROBAN) 2 % Place 1 application into the nose 2 (two) times daily.   PANTOTHENIC ACID PO Take 1 capsule by mouth daily.   Potassium 99 MG TABS Take by mouth.   triamcinolone cream (KENALOG) 0.1 % Apply 1 application. topically 2 (two) times daily.   vitamin A 10000 UT capsule Take 10,000 Units by mouth daily.   Zinc 30 MG TABS Take by mouth.   No facility-administered encounter medications  on file as of 05/02/2022.    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 2009: Reported workup at [facility] including ECHO, Stress Test and Cardiac Cath; records N/A.  ECHO with mild LVH, mild MVP; stress test and cath reportedly normal    Depression    Diabetes mellitus without complication (HCC)    Edema    GERD (gastroesophageal reflux disease)    Hypertension    IBS (irritable bowel syndrome)    Mitral  valve prolapse    Patient is Jehovah's Witness    Sleep apnea    MILD-NO CPAP    Past Surgical History:  Procedure Laterality Date   ABDOMINAL HYSTERECTOMY  1985   BILATERAL SALPINGOOPHORECTOMY  2002   due to fh of ovarian ca   BREAST BIOPSY Right 03/09/2016   papilloma   BREAST LUMPECTOMY Right 06/08/2016   Procedure: BREAST LUMPECTOMY WITH ULTRASOUND IN O.R;  Surgeon: Christene Lye, MD;  Location: [facility] ORS;  Service: General;  Laterality: Right;   BREAST SURGERY Left 02-26-13   subareloar duct excision and mass   COLONOSCOPY  2012   COLONOSCOPY WITH PROPOFOL N/A 04/09/2021   Procedure: COLONOSCOPY WITH PROPOFOL;  Surgeon: Lesly Rubenstein, MD;  Location: [facility] ENDOSCOPY;  Service: Endoscopy;  Laterality: N/A;   ESOPHAGOGASTRODUODENOSCOPY (EGD) WITH PROPOFOL N/A 04/09/2021   Procedure: ESOPHAGOGASTRODUODENOSCOPY (EGD) WITH PROPOFOL;  Surgeon: Lesly Rubenstein, MD;  Location: [facility] ENDOSCOPY;  Service: Endoscopy;  Laterality: N/A;  DM   EXCISION / BIOPSY BREAST / NIPPLE / DUCT Right 1985   negative   EXCISION / BIOPSY BREAST / NIPPLE / DUCT Left 02/26/2013   intraductal papilloma   EXCISION / BIOPSY BREAST / NIPPLE / DUCT Right 06/09/2016   TONSILLECTOMY     Family History  Problem Relation Age of Onset   Hypertension Mother    Arthritis Mother    Parkinson's disease Father    Hypertension Sister    Cancer Sister 61       ovarian ca   Cancer Brother        multiple myeloma   Diabetes Brother    Heart disease Daughter    Bladder Cancer Paternal Uncle    Cancer Maternal Grandmother        colon ca   Breast cancer Paternal Grandmother 24   Prostate cancer Neg Hx    Kidney cancer Neg Hx    Social History   Socioeconomic History   Marital status: Married    Spouse name: Not on file   Number of children: Not on file   Years of education: Not on file   Highest education level: Not on file  Occupational History   Not on file  Tobacco Use   Smoking status:  Never   Smokeless tobacco: Never   Tobacco comments:    smoked for 52month when 71yrold  Substance and Sexual Activity   Alcohol use: No    Alcohol/week: 0.0 standard drinks of alcohol   Drug use: No   Sexual activity: Not on file  Other Topics Concern   Not on file  Social History Narrative   Not on file   Social Determinants of Health   Financial Resource Strain: Low Risk  (05/02/2022)   Overall Financial Resource Strain (CARDIA)    Difficulty of  Paying Living Expenses: Not hard at all  Food Insecurity: No Food Insecurity (05/02/2022)   Hunger Vital Sign    Worried About Running Out of Food in the Last Year: Never true    Ran Out of Food in the Last Year: Never true  Transportation Needs: No Transportation Needs (05/02/2022)   PRAPARE - Hydrologist (Medical): No    Lack of Transportation (Non-Medical): No  Physical Activity: Unknown (04/23/2020)   Exercise Vital Sign    Days of Exercise per Week: 0 days    Minutes of Exercise per Session: Not on file  Stress: No Stress Concern Present (05/02/2022)   [place]    Feeling of Stress : Not at all  Social Connections: [place] (05/02/2022)   Social Connection and Isolation Panel [NHANES]    Frequency of Communication with Friends and Family: More than three times a week    Frequency of Social Gatherings with Friends and Family: Not on file    Attends Religious Services: 1 to 4 times per year    Active Member of [employer] or Organizations: Yes    Attends Archivist Meetings: 1 to 4 times per year    Marital Status: Married    Tobacco Counseling Counseling given: Not Answered Tobacco comments: smoked for 35month when 71yrold   Clinical Intake:  Pre-visit preparation completed: Yes        Diabetes: Yes (Followed by PCP)  How often do you need to have someone help you when you read instructions, pamphlets,  or other written materials from your doctor or pharmacy?: 1 - Never  Interpreter Needed?: No    Activities of Daily Living    05/02/2022    1:42 PM  In your present state of health, do you have any difficulty performing the following activities:  Hearing? 1  Vision? 0  Difficulty concentrating or making decisions? 0  Walking or climbing stairs? 0  Dressing or bathing? 0  Doing errands, shopping? 0  Preparing Food and eating ? N  Using the Toilet? N  In the past six months, have you accidently leaked urine? N  Do you have problems with loss of bowel control? N  Managing your Medications? N  Managing your Finances? N  Housekeeping or managing your Housekeeping? N   Patient Care Team: TuCrecencio McMD as PCP - General (Internal Medicine) SaChristene LyeMD (General Surgery) TaAlbina BilletMD (Internal Medicine)  Indicate any recent Medical Services you may have received from other than [facility] providers in the past year (date may be approximate).     Assessment:   This is a routine wellness examination for Kari Lyons.  Virtual Visit via Telephone Note  I connected with  Kari MENDELLn 05/02/22 at  1:30 PM EDT by telephone and verified that I am speaking with the correct person using two identifiers.  Persons participating in the virtual visit: patient/Nurse Health Advisor   I discussed the limitations of performing an evaluation and management service by telehealth. We continued and completed visit with audio only. Some vital signs may be absent or patient reported.   Hearing/Vision screen Hearing Screening - Comments:: Left ear hearing loss  She does not wear any device. Followed by Dr. McTami Ribasision Screening - Comments:: [facility] Wears corrective lenses  Diabetic exam; no retinopathy reported. They have regular follow up with the ophthalmologist  Dietary issues and exercise activities discussed: Current Exercise  Habits: Home exercise routine, Intensity: Mild Healthy diet Good water intake   Goals Addressed               This Visit's Progress     Patient Stated     Increase physical activity (pt-stated)   On track     Stay active       Depression Screen    05/02/2022    1:42 PM 08/16/2021   10:52 AM 04/26/2021    2:16 PM 02/12/2021   10:44 AM 04/23/2020    2:24 PM 04/16/2019   11:38 AM 04/13/2018    9:26 AM  PHQ 2/9 Scores  PHQ - 2 Score 0 0 0 0 1 1 0  PHQ- 9 Score    2       Fall Risk    05/02/2022    1:42 PM 02/14/2022   10:39 AM 08/16/2021   10:48 AM 04/26/2021    2:21 PM 02/12/2021   10:44 AM  Fall Risk   Falls in the past year? 0 0 0 0 0  Number falls in past yr:    0   Injury with Fall?    0   Risk for fall due to :  No Fall Risks No Fall Risks    Follow up Falls evaluation completed Falls evaluation completed Falls evaluation completed Falls evaluation completed Falls evaluation completed    [place]: Home free of loose throw rugs in walkways, pet beds, electrical cords, etc? Yes  Adequate lighting in your home to reduce risk of falls? Yes   ASSISTIVE DEVICES UTILIZED TO PREVENT FALLS: Life alert? No  Use of a cane, walker or w/c? No   TIMED UP AND GO: Was the test performed? No .   Cognitive Function:    04/13/2018    9:27 AM 06/12/2017    4:18 PM  MMSE - Mini Mental State Exam  Orientation to time 5 5  Orientation to Place 5 5  Registration 3 3  Attention/ Calculation 5 5  Recall 3 3  Language- name 2 objects 2 2  Language- repeat 1 1  Language- follow 3 step command 3 3  Language- read & follow direction 1 1  Write a sentence 1 1  Copy design 1 1  Total score 30 30        05/02/2022    2:01 PM 04/26/2021    2:29 PM 04/23/2020    2:40 PM 04/16/2019   11:41 AM  6CIT Screen  What Year? 0 points 0 points 0 points 0 points  What month? 0 points 0 points 0 points 0 points  What time? 0 points   0 points  Count back  from 20 0 points 0 points  0 points  Months in reverse 0 points 0 points 0 points 0 points  Repeat phrase 0 points 0 points 0 points 0 points  Total Score 0 points   0 points    Immunizations Immunization History  Administered Date(s) Administered   Fluad Quad(high Dose 65+) 08/02/2019, 08/14/2020, 08/16/2021   Influenza,inj,Quad PF,6+ Mos 09/15/2017, 07/06/2018   Moderna Sars-Covid-2 Vaccination 12/31/2019, 01/28/2020, 08/24/2020, 02/26/2021   Pneumococcal Polysaccharide-23 08/14/2020   Pneumococcal vaccine status: Due, Education has been provided regarding the importance of this vaccine. Advised may receive this vaccine at local pharmacy or Health Dept. Aware to provide a copy of the vaccination record if obtained from local pharmacy or  Health Dept. Verbalized acceptance and understanding.  Shingrix Completed?: No.    Education has been provided regarding the importance of this vaccine. Patient has been advised to call insurance company to determine out of pocket expense if they have not yet received this vaccine. Advised may also receive vaccine at local pharmacy or Health Dept. Verbalized acceptance and understanding.  Screening Tests Health Maintenance  Topic Date Due   COVID-19 Vaccine (5 - Booster for Moderna series) 05/18/2022 (Originally 04/23/2021)   Pneumonia Vaccine 20+ Years old (2 - PCV) 08/02/2022 (Originally 08/14/2021)   Zoster Vaccines- Shingrix (1 of 2) 08/02/2022 (Originally 05/11/1970)   INFLUENZA VACCINE  05/17/2022   HEMOGLOBIN A1C  08/17/2022   OPHTHALMOLOGY EXAM  08/20/2022   MAMMOGRAM  10/01/2022   FOOT EXAM  02/15/2023   COLONOSCOPY (Pts 45-3yr Insurance coverage will need to be confirmed)  04/10/2031   DEXA SCAN  Completed   Hepatitis C Screening  Completed   HPV VACCINES  Aged Out   TETANUS/TDAP  Discontinued   Health Maintenance There are no preventive care reminders to display for this patient.  Lung Cancer Screening: (Low Dose CT Chest recommended  if Age 555-80years, 30 pack-year currently smoking OR have quit w/in 15years.) does not qualify.   Vision Screening: Recommended annual ophthalmology exams for early detection of glaucoma and other disorders of the eye.  Dental Screening: Recommended annual dental exams for proper oral hygiene  Community Resource Referral / Chronic Care Management: CRR required this visit?  No   CCM required this visit?  No      Plan:   Keep all routine maintenance appointments.   I have personally reviewed and noted the following in the patient's chart:   Medical and social history Use of alcohol, tobacco or illicit drugs  Current medications and supplements including opioid prescriptions.  Functional ability and status Nutritional status Physical activity Advanced directives List of other physicians Hospitalizations, surgeries, and ER visits in previous 12 months Vitals Screenings to include cognitive, depression, and falls Referrals and appointments  In addition, I have reviewed and discussed with patient certain preventive protocols, quality metrics, and best practice recommendations. A written personalized care plan for preventive services as well as general preventive health recommendations were provided to patient.     Kari Lyons, Kari Livengood L, LPN   79/12/147    I have reviewed the above information and agree with above.   TDeborra Medina MD

## 2022-05-09 DIAGNOSIS — M542 Cervicalgia: Secondary | ICD-10-CM | POA: Diagnosis not present

## 2022-05-12 DIAGNOSIS — M542 Cervicalgia: Secondary | ICD-10-CM | POA: Diagnosis not present

## 2022-05-16 ENCOUNTER — Other Ambulatory Visit: Payer: Self-pay | Admitting: Internal Medicine

## 2022-05-16 DIAGNOSIS — M542 Cervicalgia: Secondary | ICD-10-CM | POA: Diagnosis not present

## 2022-05-19 DIAGNOSIS — M542 Cervicalgia: Secondary | ICD-10-CM | POA: Diagnosis not present

## 2022-05-23 DIAGNOSIS — M542 Cervicalgia: Secondary | ICD-10-CM | POA: Diagnosis not present

## 2022-05-24 DIAGNOSIS — M542 Cervicalgia: Secondary | ICD-10-CM | POA: Diagnosis not present

## 2022-05-26 DIAGNOSIS — M542 Cervicalgia: Secondary | ICD-10-CM | POA: Diagnosis not present

## 2022-05-31 DIAGNOSIS — M542 Cervicalgia: Secondary | ICD-10-CM | POA: Diagnosis not present

## 2022-06-02 ENCOUNTER — Other Ambulatory Visit: Payer: Self-pay | Admitting: Internal Medicine

## 2022-06-03 DIAGNOSIS — M542 Cervicalgia: Secondary | ICD-10-CM | POA: Diagnosis not present

## 2022-06-06 DIAGNOSIS — G8929 Other chronic pain: Secondary | ICD-10-CM | POA: Diagnosis not present

## 2022-06-06 DIAGNOSIS — M542 Cervicalgia: Secondary | ICD-10-CM | POA: Diagnosis not present

## 2022-06-06 DIAGNOSIS — M7918 Myalgia, other site: Secondary | ICD-10-CM | POA: Diagnosis not present

## 2022-06-06 DIAGNOSIS — M549 Dorsalgia, unspecified: Secondary | ICD-10-CM | POA: Diagnosis not present

## 2022-07-02 ENCOUNTER — Other Ambulatory Visit: Payer: Self-pay | Admitting: Internal Medicine

## 2022-07-02 DIAGNOSIS — E1159 Type 2 diabetes mellitus with other circulatory complications: Secondary | ICD-10-CM

## 2022-08-11 ENCOUNTER — Telehealth: Payer: Self-pay | Admitting: Internal Medicine

## 2022-08-11 DIAGNOSIS — E042 Nontoxic multinodular goiter: Secondary | ICD-10-CM

## 2022-08-11 DIAGNOSIS — E1169 Type 2 diabetes mellitus with other specified complication: Secondary | ICD-10-CM

## 2022-08-11 DIAGNOSIS — R5383 Other fatigue: Secondary | ICD-10-CM

## 2022-08-11 DIAGNOSIS — I152 Hypertension secondary to endocrine disorders: Secondary | ICD-10-CM

## 2022-08-11 NOTE — Telephone Encounter (Signed)
Orders have been placed.

## 2022-08-11 NOTE — Addendum Note (Signed)
Addended by: Adair Laundry on: 08/11/2022 01:00 PM   Modules accepted: Orders

## 2022-08-11 NOTE — Telephone Encounter (Signed)
Patient has a lab appt 08/16/2022, there are no orders in.

## 2022-08-16 ENCOUNTER — Other Ambulatory Visit (INDEPENDENT_AMBULATORY_CARE_PROVIDER_SITE_OTHER): Payer: Medicare PPO

## 2022-08-16 DIAGNOSIS — R5383 Other fatigue: Secondary | ICD-10-CM

## 2022-08-16 DIAGNOSIS — E042 Nontoxic multinodular goiter: Secondary | ICD-10-CM

## 2022-08-16 DIAGNOSIS — E669 Obesity, unspecified: Secondary | ICD-10-CM | POA: Diagnosis not present

## 2022-08-16 DIAGNOSIS — I152 Hypertension secondary to endocrine disorders: Secondary | ICD-10-CM

## 2022-08-16 DIAGNOSIS — E785 Hyperlipidemia, unspecified: Secondary | ICD-10-CM

## 2022-08-16 DIAGNOSIS — E1159 Type 2 diabetes mellitus with other circulatory complications: Secondary | ICD-10-CM | POA: Diagnosis not present

## 2022-08-16 DIAGNOSIS — E1169 Type 2 diabetes mellitus with other specified complication: Secondary | ICD-10-CM | POA: Diagnosis not present

## 2022-08-16 LAB — CBC WITH DIFFERENTIAL/PLATELET
Basophils Absolute: 0 10*3/uL (ref 0.0–0.1)
Basophils Relative: 0.8 % (ref 0.0–3.0)
Eosinophils Absolute: 0.1 10*3/uL (ref 0.0–0.7)
Eosinophils Relative: 2 % (ref 0.0–5.0)
HCT: 38.4 % (ref 36.0–46.0)
Hemoglobin: 12.4 g/dL (ref 12.0–15.0)
Lymphocytes Relative: 46.8 % — ABNORMAL HIGH (ref 12.0–46.0)
Lymphs Abs: 2.1 10*3/uL (ref 0.7–4.0)
MCHC: 32.3 g/dL (ref 30.0–36.0)
MCV: 87.2 fl (ref 78.0–100.0)
Monocytes Absolute: 0.4 10*3/uL (ref 0.1–1.0)
Monocytes Relative: 8.8 % (ref 3.0–12.0)
Neutro Abs: 1.9 10*3/uL (ref 1.4–7.7)
Neutrophils Relative %: 41.6 % — ABNORMAL LOW (ref 43.0–77.0)
Platelets: 222 10*3/uL (ref 150.0–400.0)
RBC: 4.4 Mil/uL (ref 3.87–5.11)
RDW: 14 % (ref 11.5–15.5)
WBC: 4.5 10*3/uL (ref 4.0–10.5)

## 2022-08-16 LAB — HEMOGLOBIN A1C: Hgb A1c MFr Bld: 6.3 % (ref 4.6–6.5)

## 2022-08-16 LAB — COMPREHENSIVE METABOLIC PANEL
ALT: 26 U/L (ref 0–35)
AST: 33 U/L (ref 0–37)
Albumin: 4.3 g/dL (ref 3.5–5.2)
Alkaline Phosphatase: 69 U/L (ref 39–117)
BUN: 15 mg/dL (ref 6–23)
CO2: 29 mEq/L (ref 19–32)
Calcium: 9.7 mg/dL (ref 8.4–10.5)
Chloride: 102 mEq/L (ref 96–112)
Creatinine, Ser: 0.94 mg/dL (ref 0.40–1.20)
GFR: 60.73 mL/min (ref >60.00)
Glucose, Bld: 94 mg/dL (ref 70–99)
Potassium: 4.2 mEq/L (ref 3.5–5.1)
Sodium: 138 mEq/L (ref 135–145)
Total Bilirubin: 0.6 mg/dL (ref 0.2–1.2)
Total Protein: 7 g/dL (ref 6.0–8.3)

## 2022-08-16 LAB — LDL CHOLESTEROL, DIRECT: Direct LDL: 94 mg/dL

## 2022-08-16 LAB — MICROALBUMIN / CREATININE URINE RATIO
Creatinine,U: 55.8 mg/dL
Microalb Creat Ratio: 1.3 mg/g (ref 0.0–30.0)
Microalb, Ur: <0.7 mg/dL (ref 0.0–1.9)

## 2022-08-16 LAB — LIPID PANEL
Cholesterol: 156 mg/dL (ref 0–200)
HDL: 49.6 mg/dL (ref >39.00)
LDL Cholesterol: 92 mg/dL (ref 0–99)
NonHDL: 106.78
Total CHOL/HDL Ratio: 3
Triglycerides: 75 mg/dL (ref 0.0–149.0)
VLDL: 15 mg/dL (ref 0.0–40.0)

## 2022-08-16 LAB — TSH: TSH: 2.12 u[IU]/mL (ref 0.35–5.50)

## 2022-08-22 ENCOUNTER — Ambulatory Visit (INDEPENDENT_AMBULATORY_CARE_PROVIDER_SITE_OTHER): Payer: Medicare PPO | Admitting: Internal Medicine

## 2022-08-22 ENCOUNTER — Telehealth: Payer: Self-pay | Admitting: Internal Medicine

## 2022-08-22 ENCOUNTER — Encounter: Payer: Self-pay | Admitting: Internal Medicine

## 2022-08-22 VITALS — BP 138/76 | HR 82 | Temp 97.9°F | Ht 65.0 in | Wt 196.2 lb

## 2022-08-22 DIAGNOSIS — E1169 Type 2 diabetes mellitus with other specified complication: Secondary | ICD-10-CM | POA: Diagnosis not present

## 2022-08-22 DIAGNOSIS — I10 Essential (primary) hypertension: Secondary | ICD-10-CM | POA: Diagnosis not present

## 2022-08-22 DIAGNOSIS — E669 Obesity, unspecified: Secondary | ICD-10-CM

## 2022-08-22 DIAGNOSIS — E538 Deficiency of other specified B group vitamins: Secondary | ICD-10-CM | POA: Diagnosis not present

## 2022-08-22 DIAGNOSIS — E1159 Type 2 diabetes mellitus with other circulatory complications: Secondary | ICD-10-CM

## 2022-08-22 DIAGNOSIS — Z23 Encounter for immunization: Secondary | ICD-10-CM

## 2022-08-22 DIAGNOSIS — E119 Type 2 diabetes mellitus without complications: Secondary | ICD-10-CM

## 2022-08-22 DIAGNOSIS — E785 Hyperlipidemia, unspecified: Secondary | ICD-10-CM | POA: Diagnosis not present

## 2022-08-22 DIAGNOSIS — K648 Other hemorrhoids: Secondary | ICD-10-CM | POA: Diagnosis not present

## 2022-08-22 DIAGNOSIS — I152 Hypertension secondary to endocrine disorders: Secondary | ICD-10-CM

## 2022-08-22 DIAGNOSIS — D369 Benign neoplasm, unspecified site: Secondary | ICD-10-CM

## 2022-08-22 MED ORDER — AZELASTINE HCL 0.1 % NA SOLN: NASAL | 5 refills | Status: DC

## 2022-08-22 MED ORDER — HYDROCORTISONE ACETATE 25 MG RE SUPP: 25.0000 mg | Freq: Two times a day (BID) | RECTAL | 5 refills | Status: DC

## 2022-08-22 MED ORDER — HYDROCHLOROTHIAZIDE 25 MG PO TABS: 25.0000 mg | ORAL_TABLET | Freq: Every day | ORAL | 3 refills | Status: DC

## 2022-08-22 NOTE — Assessment & Plan Note (Signed)
Not seen on prior colonoscopy but present on rectal exam today. Recommend use of glycerin suppositories to aid in defecation without pain,  and anusol hc suppositories prn flare

## 2022-08-22 NOTE — Assessment & Plan Note (Signed)
Her diabetes remains.  well-controlled on diet alone .   Patient is up-to-date on eye exams and foot exam is normal today. Patient Diagnosed in August 2018 with an A1c of 6.5 , diet controlled since then.  She has no microalbuminuria. Patient has continued to defer statin therapy for CAD risk reduction despite her LDL> 70,  And is taking ACE/ARB for renal protection and hypertension  With normal readings,  Normal cr and lytes.   Lab Results  Component Value Date   HGBA1C 6.3 08/16/2022   Lab Results  Component Value Date   LABMICR See below: 10/30/2015   LABMICR See below: 10/13/2015   MICROALBUR <0.7 08/16/2022   MICROALBUR <0.7 08/16/2021    Lab Results  Component Value Date   CHOL 156 08/16/2022   HDL 49.60 08/16/2022   LDLCALC 92 08/16/2022   LDLDIRECT 94.0 08/16/2022   TRIG 75.0 08/16/2022   CHOLHDL 3 08/16/2022    Lab Results  Component Value Date   CREATININE 0.94 08/16/2022   Lab Results  Component Value Date   NA 138 08/16/2022   K 4.2 08/16/2022   CL 102 08/16/2022   CO2 29 08/16/2022

## 2022-08-22 NOTE — Telephone Encounter (Signed)
Lft pt vm to call ofc . thanks 

## 2022-08-22 NOTE — Assessment & Plan Note (Addendum)
Patient has had 3 prior breast surgeries  for "blocked milk ducts "  and presents with nipple leakage on the left of clear fluid , occasionally blood tinged .  Last surgery was over 5 years ago by Dr Jamal Collin.  Mammogram was normal Dec 2022, but given her new symptoms of right breast pain and left nipple discharge  she will need diagnostic mammograms

## 2022-08-22 NOTE — Assessment & Plan Note (Signed)
Not Well controlled on current regimen of  losartan 100 mg daily and  12.5  hctz . Increasing hctz to 25 mg daily   Renal function stable.  Lab Results  Component Value Date   CREATININE 0.94 08/16/2022   Lab Results  Component Value Date   NA 138 08/16/2022   K 4.2 08/16/2022   CL 102 08/16/2022   CO2 29 08/16/2022

## 2022-08-22 NOTE — Assessment & Plan Note (Signed)
She has been supplementing her vegetarian diet  Lab Results  Component Value Date   VITAMINB12 1,398 (H) 08/02/2019

## 2022-08-22 NOTE — Progress Notes (Signed)
Subjective:  Patient ID: Kari Lyons, female    DOB: 04-15-1951  Age: 70 y.o. MRN: [id number]  CC: The primary encounter diagnosis was Hyperlipidemia associated with type 2 diabetes mellitus ([place]). Diagnoses of Type 2 diabetes mellitus without complication, without long-term current use of insulin ([place]), Intraductal papilloma, Need for immunization against influenza, Obesity, diabetes, and hypertension syndrome ([place]), B12 deficiency due to diet, Essential hypertension, and Internal hemorrhoid were also pertinent to this visit.   HPI Kari Lyons presents for   breast disccharge/pain,  hemorrhoids (presumed) pain,  diabetes and hypertension folow u[  Chief Complaint  Patient presents with   Follow-up    6 month follow up    T2D<. Obesity and hypertension   Hypertension: patient checks blood pressure twice weekly at home.  Readings have been for the most part > 140/80 at rest . Patient is following a reduce salt diet most days and is taking medications as prescribed   Breast pain ;  h/o left breast biopsy in 2014 by Jamal Collin.  2 others per patient  Outpatient Medications Prior to Visit  Medication Sig Dispense Refill   Accu-Chek Softclix Lancets lancets USE 1 LANCET TO CHECK GLUCOSE ONCE DAILY 100 each 0   Ascorbic Acid (VITAMIN C) 100 MG tablet Take 100 mg by mouth daily.     B Complex-Biotin-FA (TH VITAMIN B 50/B-COMPLEX PO) Take by mouth.     blood glucose meter kit and supplies KIT Use once daily to check fasting and post prandial blood sugars 1 each 0   blood glucose meter kit and supplies Dispense based on patient and insurance preference. Use up to four times daily as directed. (FOR ICD-10 E10.9, E11.9). 1 each 0   Blood Glucose Monitoring Suppl (ACCU-CHEK AVIVA PLUS) w/Device KIT 1 Device by Does not apply route daily. 1 kit 0   Cholecalciferol (VITAMIN D3) 10 MCG (400 UNIT) tablet Take 400 Units by mouth daily.     Chromium 200 MCG CAPS Take 1 capsule by mouth daily.      glucose blood (ACCU-CHEK AVIVA PLUS) test strip USED AS DIRECTED TO TEST BLOOD SUGAR ONCE A DAY.  Dx code: E11.69 100 strip 11   losartan (COZAAR) 100 MG tablet Take 1 tablet by mouth once daily 90 tablet 0   mupirocin ointment (BACTROBAN) 2 % Place 1 application into the nose 2 (two) times daily. 22 g 0   PANTOTHENIC ACID PO Take 1 capsule by mouth daily.     Potassium 99 MG TABS Take by mouth.     triamcinolone cream (KENALOG) 0.1 % Apply 1 application. topically 2 (two) times daily. 30 g 2   vitamin A 10000 UT capsule Take 10,000 Units by mouth daily.     Zinc 30 MG TABS Take by mouth.     azelastine (ASTELIN) 0.1 % nasal spray USE 2 SPRAY(S) IN EACH NOSTRIL TWICE DAILY AS DIRECTED     hydrochlorothiazide (MICROZIDE) 12.5 MG capsule Take 1 capsule by mouth once daily 90 capsule 1   gabapentin (NEURONTIN) 100 MG capsule Take 1 capsule (100 mg total) by mouth 3 (three) times daily. (Patient not taking: Reported on 02/14/2022) 90 capsule 3   No facility-administered medications prior to visit.    Review of Systems;  Patient denies headache, fevers, malaise, unintentional weight loss, skin rash, eye pain, sinus congestion and sinus pain, sore throat, dysphagia,  hemoptysis , cough, dyspnea, wheezing, chest pain, palpitations, orthopnea, edema, abdominal pain, nausea, melena, diarrhea, constipation, flank pain,  dysuria, hematuria, urinary  Frequency, nocturia, numbness, tingling, seizures,  Focal weakness, Loss of consciousness,  Tremor, insomnia, depression, anxiety, and suicidal ideation.      Objective:  BP 138/76 (BP Location: Left Arm, Patient Position: Sitting, Cuff Size: Large)   Pulse 82   Temp 97.9 F (36.6 C) (Oral)   Ht _0  (1.651 m)   Wt 196 lb 3.2 oz (89 kg)   SpO2 98%   BMI 32.65 kg/m   BP Readings from Last 3 Encounters:  08/22/22 138/76  02/14/22 (!) 146/82  08/16/21 126/76    Wt Readings from Last 3 Encounters:  08/22/22 196 lb 3.2 oz (89 kg)  05/02/22 187 lb  (84.8 kg)  02/14/22 187 lb 3.2 oz (84.9 kg)    General appearance: alert, cooperative and appears stated age Ears: normal TM's and external ear canals both ears Throat: lips, mucosa, and tongue normal; teeth and gums normal Neck: no adenopathy, no carotid bruit, supple, symmetrical, trachea midline and thyroid not enlarged, symmetric, no tenderness/mass/nodules Back: symmetric, no curvature. ROM normal. No CVA tenderness. Lungs: clear to auscultation bilaterally Breasts:  right breast tender medially at 3:00 position, left breast nontender,  no current nipple discharge  Heart: regular rate and rhythm, S1, S2 normal, no murmur, click, rub or gallop Abdomen: soft, non-tender; bowel sounds normal; no masses,  no organomegaly Pulses: 2+ and symmetric Skin: Skin color, texture, turgor normal. No rashes or lesions Lymph nodes: Cervical, supraclavicular, and axillary nodes normal. Rectal:  small nont hrombosed internal hemorrhoids,  external skin tags.  Neuro:  awake and interactive with normal mood and affect. Higher cortical functions are normal. Speech is clear without word-finding difficulty or dysarthria. Extraocular movements are intact. Visual fields of both eyes are grossly intact. Sensation to light touch is grossly intact bilaterally of upper and lower extremities. Motor examination shows 4+/5 symmetric hand grip and upper extremity and 5/5 lower extremity strength. There is no pronation or drift. Gait is non-ataxic   Lab Results  Component Value Date   HGBA1C 6.3 08/16/2022   HGBA1C 6.0 02/14/2022   HGBA1C 6.2 08/16/2021    Lab Results  Component Value Date   CREATININE 0.94 08/16/2022   CREATININE 0.90 02/14/2022   CREATININE 0.89 08/16/2021    Lab Results  Component Value Date   WBC 4.5 08/16/2022   HGB 12.4 08/16/2022   HCT 38.4 08/16/2022   PLT 222.0 08/16/2022   GLUCOSE 94 08/16/2022   CHOL 156 08/16/2022   TRIG 75.0 08/16/2022   HDL 49.60 08/16/2022   LDLDIRECT  94.0 08/16/2022   LDLCALC 92 08/16/2022   ALT 26 08/16/2022   AST 33 08/16/2022   NA 138 08/16/2022   K 4.2 08/16/2022   CL 102 08/16/2022   CREATININE 0.94 08/16/2022   BUN 15 08/16/2022   CO2 29 08/16/2022   TSH 2.12 08/16/2022   HGBA1C 6.3 08/16/2022   MICROALBUR <0.7 08/16/2022    MM 3D SCREEN BREAST BILATERAL  Result Date: 10/01/2021 CLINICAL DATA:  Screening. EXAM: DIGITAL SCREENING BILATERAL MAMMOGRAM WITH TOMOSYNTHESIS AND CAD TECHNIQUE: Bilateral screening digital craniocaudal and mediolateral oblique mammograms were obtained. Bilateral screening digital breast tomosynthesis was performed. The images were evaluated with computer-aided detection. COMPARISON:  Previous exam(s). ACR Breast Density Category b: There are scattered areas of fibroglandular density. FINDINGS: There are no findings suspicious for malignancy. IMPRESSION: No mammographic evidence of malignancy. A result letter of this screening mammogram will be mailed directly to the patient. RECOMMENDATION: Screening mammogram in one  year. (Code:[id number]-B-01Y) BI-RADS CATEGORY  1: Negative. Electronically Signed   By: Ammie Ferrier M.D.   On: 10/01/2021 16:18   Assessment & Plan:   Problem List Items Addressed This Visit     Obesity, diabetes, and hypertension syndrome ([place])     Her diabetes remains.  well-controlled on diet alone .   Patient is up-to-date on eye exams and foot exam is normal today. Patient Diagnosed in August 2018 with an A1c of 6.5 , diet controlled since then.  She has no microalbuminuria. Patient has continued to defer statin therapy for CAD risk reduction despite her LDL> 70,  And is taking ACE/ARB for renal protection and hypertension  With normal readings,  Normal cr and lytes.   Lab Results  Component Value Date   HGBA1C 6.3 08/16/2022   Lab Results  Component Value Date   LABMICR See below: 10/30/2015   LABMICR See below: 10/13/2015   MICROALBUR <0.7 08/16/2022   MICROALBUR <0.7  08/16/2021   Lab Results  Component Value Date   CHOL 156 08/16/2022   HDL 49.60 08/16/2022   LDLCALC 92 08/16/2022   LDLDIRECT 94.0 08/16/2022   TRIG 75.0 08/16/2022   CHOLHDL 3 08/16/2022    Lab Results  Component Value Date   CREATININE 0.94 08/16/2022   Lab Results  Component Value Date   NA 138 08/16/2022   K 4.2 08/16/2022   CL 102 08/16/2022   CO2 29 08/16/2022           Relevant Medications   hydrochlorothiazide (HYDRODIURIL) 25 MG tablet   Intraductal papilloma    Patient has had 3 prior breast surgeries  for "blocked milk ducts "  and presents with nipple leakage on the left of clear fluid , occasionally blood tinged .  Last surgery was over 5 years ago by Dr Jamal Collin.  Mammogram was normal Dec 2022, but given her new symptoms of right breast pain and left nipple discharge  she will need diagnostic mammograms      Relevant Orders   MM DIAG BREAST TOMO BILATERAL   US BREAST LTD UNI LEFT INC AXILLA   US BREAST LTD UNI RIGHT INC AXILLA   Internal hemorrhoid    Not seen on prior colonoscopy but present on rectal exam today. Recommend use of glycerin suppositories to aid in defecation without pain,  and anusol hc suppositories prn flare      Relevant Medications   hydrochlorothiazide (HYDRODIURIL) 25 MG tablet   Essential hypertension    Not Well controlled on current regimen of  losartan 100 mg daily and  12.5  hctz . Increasing hctz to 25 mg daily   Renal function stable.  Lab Results  Component Value Date   CREATININE 0.94 08/16/2022   Lab Results  Component Value Date   NA 138 08/16/2022   K 4.2 08/16/2022   CL 102 08/16/2022   CO2 29 08/16/2022        Relevant Medications   hydrochlorothiazide (HYDRODIURIL) 25 MG tablet   B12 deficiency due to diet    She has been supplementing her vegetarian diet  Lab Results  Component Value Date   VITAMINB12 1,398 (H) 08/02/2019        Other Visit Diagnoses     Hyperlipidemia associated with type 2  diabetes mellitus ([place])    -  Primary   Relevant Medications   hydrochlorothiazide (HYDRODIURIL) 25 MG tablet   Other Relevant Orders   Lipid panel   Direct LDL  Type 2 diabetes mellitus without complication, without long-term current use of insulin ([facility])       Relevant Orders   Comp Met (CMET)   HgB A1c   Need for immunization against influenza       Relevant Orders   Flu Vaccine QUAD High Dose(Fluad) (Completed)       I spent a total of  40 minutes with this patient in a face to face visit on the date of this encounter reviewing the last office visit with me,  patient's diet and exercise habits, home blood pressure /blood sugar readings, previous breast biopsies,  and post visit ordering of testing and therapeutics.    Follow-up: Return in about 6 months (around 02/20/2023) for follow up diabetes.   [name], MD

## 2022-08-22 NOTE — Patient Instructions (Addendum)
I am increasing your hctz to 25 mg daily .  Goal BP is 130/80 or slightly less   I am ordering diagnostic mammogram and ultrasound    Anusol HC suppositories  for inflamed hemorrhoids (sent to [facility])  Use Glycerin suppositories DAILY (insert before you have a BM to help lubricate your rectum )

## 2022-08-26 DIAGNOSIS — H2513 Age-related nuclear cataract, bilateral: Secondary | ICD-10-CM | POA: Diagnosis not present

## 2022-08-26 DIAGNOSIS — Z0100 Encounter for examination of eyes and vision without abnormal findings: Secondary | ICD-10-CM | POA: Diagnosis not present

## 2022-08-26 LAB — HM DIABETES EYE EXAM

## 2022-09-02 ENCOUNTER — Telehealth: Payer: Self-pay | Admitting: Family

## 2022-09-12 ENCOUNTER — Ambulatory Visit
Admission: RE | Admit: 2022-09-12 | Discharge: 2022-09-12 | Disposition: A | Discharge status: Home / Self Care | Payer: Medicare PPO | Source: Ambulatory Visit | Attending: Internal Medicine | Admitting: Internal Medicine

## 2022-09-12 ENCOUNTER — Other Ambulatory Visit: Payer: Self-pay | Admitting: Internal Medicine

## 2022-09-12 DIAGNOSIS — N6321 Unspecified lump in the left breast, upper outer quadrant: Secondary | ICD-10-CM | POA: Insufficient documentation

## 2022-09-12 DIAGNOSIS — N644 Mastodynia: Secondary | ICD-10-CM | POA: Diagnosis not present

## 2022-09-12 DIAGNOSIS — R928 Other abnormal and inconclusive findings on diagnostic imaging of breast: Secondary | ICD-10-CM

## 2022-09-12 DIAGNOSIS — D369 Benign neoplasm, unspecified site: Secondary | ICD-10-CM | POA: Insufficient documentation

## 2022-09-12 DIAGNOSIS — N63 Unspecified lump in unspecified breast: Secondary | ICD-10-CM

## 2022-09-12 DIAGNOSIS — N6452 Nipple discharge: Secondary | ICD-10-CM | POA: Diagnosis not present

## 2022-09-12 NOTE — Telephone Encounter (Signed)
Medication has been refilled.

## 2022-09-12 NOTE — Telephone Encounter (Signed)
Patient called, she is out of her losartan (COZAAR) 100 MG tablet.

## 2022-09-15 DIAGNOSIS — N6320 Unspecified lump in the left breast, unspecified quadrant: Secondary | ICD-10-CM | POA: Insufficient documentation

## 2022-09-15 NOTE — Assessment & Plan Note (Signed)
She is being scheduled for US guided biopsy by radiology.  MRI breast may be needed based on results

## 2022-09-23 ENCOUNTER — Other Ambulatory Visit: Payer: Self-pay

## 2022-09-23 MED ORDER — LOSARTAN POTASSIUM 100 MG PO TABS: 100.0000 mg | ORAL_TABLET | Freq: Every day | ORAL | 1 refills | Status: DC

## 2022-10-03 ENCOUNTER — Ambulatory Visit
Admission: RE | Admit: 2022-10-03 | Discharge: 2022-10-03 | Disposition: A | Discharge status: Home / Self Care | Payer: Medicare PPO | Source: Ambulatory Visit | Attending: Internal Medicine | Admitting: Internal Medicine

## 2022-10-03 DIAGNOSIS — N63 Unspecified lump in unspecified breast: Secondary | ICD-10-CM | POA: Diagnosis not present

## 2022-10-03 DIAGNOSIS — R928 Other abnormal and inconclusive findings on diagnostic imaging of breast: Secondary | ICD-10-CM | POA: Insufficient documentation

## 2022-10-03 DIAGNOSIS — N6324 Unspecified lump in the left breast, lower inner quadrant: Secondary | ICD-10-CM | POA: Diagnosis not present

## 2022-10-03 DIAGNOSIS — N6342 Unspecified lump in left breast, subareolar: Secondary | ICD-10-CM | POA: Diagnosis not present

## 2022-10-03 DIAGNOSIS — D242 Benign neoplasm of left breast: Secondary | ICD-10-CM | POA: Diagnosis not present

## 2022-10-03 HISTORY — PX: BREAST BIOPSY: SHX20

## 2022-10-03 MED ORDER — LIDOCAINE-EPINEPHRINE 1 %-1:100000 IJ SOLN
5.0000 mL | Freq: Once | INTRAMUSCULAR | Status: AC
  Administered 2022-10-03: 5 mL
  Filled 2022-10-03: qty 5

## 2022-10-03 MED ORDER — LIDOCAINE HCL (PF) 1 % IJ SOLN
1.0000 mL | Freq: Once | INTRAMUSCULAR | Status: AC
  Administered 2022-10-03: 1 mL
  Filled 2022-10-03: qty 2

## 2022-10-03 MED ORDER — LIDOCAINE-EPINEPHRINE 1 %-1:100000 IJ SOLN
8.0000 mL | Freq: Once | INTRAMUSCULAR | Status: AC
  Administered 2022-10-03: 8 mL
  Filled 2022-10-03: qty 8

## 2022-10-03 MED ORDER — LIDOCAINE HCL (PF) 1 % IJ SOLN
3.0000 mL | Freq: Once | INTRAMUSCULAR | Status: AC
  Administered 2022-10-03: 3 mL
  Filled 2022-10-03: qty 4

## 2022-10-04 ENCOUNTER — Other Ambulatory Visit: Payer: Self-pay | Admitting: Internal Medicine

## 2022-10-04 DIAGNOSIS — R928 Other abnormal and inconclusive findings on diagnostic imaging of breast: Secondary | ICD-10-CM

## 2022-10-04 LAB — SURGICAL PATHOLOGY

## 2022-10-05 ENCOUNTER — Other Ambulatory Visit: Payer: Self-pay | Admitting: Internal Medicine

## 2022-10-05 DIAGNOSIS — N6321 Unspecified lump in the left breast, upper outer quadrant: Secondary | ICD-10-CM

## 2022-10-06 ENCOUNTER — Other Ambulatory Visit: Payer: Self-pay | Admitting: Internal Medicine

## 2022-10-06 DIAGNOSIS — I152 Hypertension secondary to endocrine disorders: Secondary | ICD-10-CM

## 2022-10-07 ENCOUNTER — Telehealth: Payer: Self-pay | Admitting: Internal Medicine

## 2022-10-07 NOTE — Telephone Encounter (Signed)
Spoke with pt and to let her know the general surgeon Dr. Derrel Nip referred her to. Pt gave a verbal understanding.

## 2022-10-07 NOTE — Telephone Encounter (Signed)
Pt returning call from yesterday. She stated someone called her

## 2022-10-12 ENCOUNTER — Ambulatory Visit
Admission: RE | Admit: 2022-10-12 | Discharge: 2022-10-12 | Disposition: A | Discharge status: Home / Self Care | Payer: Medicare PPO | Source: Ambulatory Visit | Attending: Internal Medicine | Admitting: Internal Medicine

## 2022-10-12 DIAGNOSIS — R928 Other abnormal and inconclusive findings on diagnostic imaging of breast: Secondary | ICD-10-CM

## 2022-10-12 DIAGNOSIS — D242 Benign neoplasm of left breast: Secondary | ICD-10-CM | POA: Diagnosis not present

## 2022-10-12 HISTORY — PX: BREAST BIOPSY: SHX20

## 2022-10-12 MED ORDER — LIDOCAINE HCL (PF) 1 % IJ SOLN
15.0000 mL | Freq: Once | INTRAMUSCULAR | Status: AC
  Administered 2022-10-12: 15 mL
  Filled 2022-10-12: qty 15

## 2022-10-12 MED ORDER — LIDOCAINE-EPINEPHRINE 1 %-1:100000 IJ SOLN
10.0000 mL | Freq: Once | INTRAMUSCULAR | Status: AC
  Administered 2022-10-12: 10 mL
  Filled 2022-10-12: qty 10

## 2022-10-13 LAB — SURGICAL PATHOLOGY

## 2022-11-08 ENCOUNTER — Other Ambulatory Visit: Payer: Self-pay | Admitting: General Surgery

## 2022-11-08 DIAGNOSIS — Z809 Family history of malignant neoplasm, unspecified: Secondary | ICD-10-CM | POA: Diagnosis not present

## 2022-11-08 DIAGNOSIS — N6452 Nipple discharge: Secondary | ICD-10-CM | POA: Diagnosis not present

## 2022-11-08 DIAGNOSIS — D242 Benign neoplasm of left breast: Secondary | ICD-10-CM | POA: Diagnosis not present

## 2022-11-10 ENCOUNTER — Other Ambulatory Visit: Payer: Self-pay | Admitting: General Surgery

## 2022-11-10 DIAGNOSIS — D242 Benign neoplasm of left breast: Secondary | ICD-10-CM

## 2022-11-18 NOTE — Pre-Procedure Instructions (Signed)
Surgical Instructions    Your procedure is scheduled on Tuesday 11/29/22.   Report to [facility] Main Entrance "A" at 05:30 A.M., then check in with the Admitting office.  Call this number if you have problems the morning of surgery:  [phone]   If you have any questions prior to your surgery date call [phone]: Open Monday-Friday 8am-4pm If you experience any cold or flu symptoms such as cough, fever, chills, shortness of breath, etc. between now and your scheduled surgery, please notify us at the above number     Remember:  Do not eat after midnight the night before your surgery  You may drink clear liquids until 04:30 A.M. the morning of your surgery.   Clear liquids allowed are: Water, Non-Citrus Juices (without pulp), Carbonated Beverages, Clear Tea, Black Coffee ONLY (NO MILK, CREAM OR POWDERED CREAMER of any kind), and Gatorade  Patient Instructions  The night before surgery:  No food after midnight. ONLY clear liquids after midnight  The day of surgery (if you have diabetes): Drink ONE (1) 12 oz G2 given to you in your pre admission testing appointment by 04:30 A.M. the morning of surgery. Drink in one sitting. Do not sip.  This drink was given to you during your hospital  pre-op appointment visit.  Nothing else to drink after completing the  12 oz bottle of G2.         If you have questions, please contact your surgeon's office.     Take these medicines the morning of surgery with A SIP OF WATER:     Take these medicines if needed:   acetaminophen (TYLENOL)    As of today, STOP taking any Aspirin (unless otherwise instructed by your surgeon) Aleve, Naproxen, Ibuprofen, Motrin, Advil, Goody's, BC's, all herbal medications, fish oil, and all vitamins.  WHAT DO I DO ABOUT MY DIABETES MEDICATION?   Do not take oral diabetes medicines (pills) the morning of surgery.  The day of surgery, do not take other diabetes injectables, including Byetta (exenatide),  Bydureon (exenatide ER), Victoza (liraglutide), or Trulicity (dulaglutide).  HOW TO MANAGE YOUR DIABETES BEFORE AND AFTER SURGERY  Why is it important to control my blood sugar before and after surgery? Improving blood sugar levels before and after surgery helps healing and can limit problems. A way of improving blood sugar control is eating a healthy diet by:  Eating less sugar and carbohydrates  Increasing activity/exercise  Talking with your doctor about reaching your blood sugar goals High blood sugars (greater than 180 mg/dL) can raise your risk of infections and slow your recovery, so you will need to focus on controlling your diabetes during the weeks before surgery. Make sure that the doctor who takes care of your diabetes knows about your planned surgery including the date and location.  How do I manage my blood sugar before surgery? Check your blood sugar at least 4 times a day, starting 2 days before surgery, to make sure that the level is not too high or low.  Check your blood sugar the morning of your surgery when you wake up and every 2 hours until you get to the Short Stay unit.  If your blood sugar is less than 70 mg/dL, you will need to treat for low blood sugar: Do not take insulin. Treat a low blood sugar (less than 70 mg/dL) with  cup of clear juice (cranberry or apple), 4 glucose tablets, OR glucose gel. Recheck blood sugar in 15 minutes after treatment (to  make sure it is greater than 70 mg/dL). If your blood sugar is not greater than 70 mg/dL on recheck, call [phone] for further instructions. Report your blood sugar to the short stay nurse when you get to Short Stay.  If you are admitted to the hospital after surgery: Your blood sugar will be checked by the staff and you will probably be given insulin after surgery (instead of oral diabetes medicines) to make sure you have good blood sugar levels. The goal for blood sugar control after surgery is 80-180  mg/dL.            Do not wear jewelry or makeup. Do not wear lotions, powders, perfumes/cologne or deodorant. Do not shave 48 hours prior to surgery.  Men may shave face and neck. Do not bring valuables to the hospital. Do not wear nail polish, gel polish, artificial nails, or any other type of covering on natural nails (fingers and toes) If you have artificial nails or gel coating that need to be removed by a nail salon, please have this removed prior to surgery. Artificial nails or gel coating may interfere with anesthesia's ability to adequately monitor your vital signs.  [facility] is not responsible for any belongings or valuables.    Do NOT Smoke (Tobacco/Vaping)  24 hours prior to your procedure  If you use a CPAP at night, you may bring your mask for your overnight stay.   Contacts, glasses, hearing aids, dentures or partials may not be worn into surgery, please bring cases for these belongings   For patients admitted to the hospital, discharge time will be determined by your treatment team.   Patients discharged the day of surgery will not be allowed to drive home, and someone needs to stay with them for 24 hours.   SURGICAL WAITING ROOM VISITATION Patients having surgery or a procedure may have no more than 2 support people in the waiting area - these visitors may rotate.   Children under the age of 85 must have an adult with them who is not the patient. If the patient needs to stay at the hospital during part of their recovery, the visitor guidelines for inpatient rooms apply. Pre-op nurse will coordinate an appropriate time for 1 support person to accompany patient in pre-op.  This support person may not rotate.   Please refer to [email or web address] for the visitor guidelines for Inpatients (after your surgery is over and you are in a regular room).    Special instructions:    Oral Hygiene is also important to reduce  your risk of infection.  Remember - BRUSH YOUR TEETH THE MORNING OF SURGERY WITH YOUR REGULAR TOOTHPASTE   [facility]- Preparing For Surgery  Before surgery, you can play an important role. Because skin is not sterile, your skin needs to be as free of germs as possible. You can reduce the number of germs on your skin by washing with CHG (chlorahexidine gluconate) Soap before surgery.  CHG is an antiseptic cleaner which kills germs and bonds with the skin to continue killing germs even after washing.     Please do not use if you have an allergy to CHG or antibacterial soaps. If your skin becomes reddened/irritated stop using the CHG.  Do not shave (including legs and underarms) for at least 48 hours prior to first CHG shower. It is OK to shave your face.  Please follow these instructions carefully.     Shower the NIGHT BEFORE SURGERY and the MORNING OF  SURGERY with CHG Soap.   If you chose to wash your hair, wash your hair first as usual with your normal shampoo. After you shampoo, rinse your hair and body thoroughly to remove the shampoo.  Then [employer] and genitals (private parts) with your normal soap and rinse thoroughly to remove soap.  After that Use CHG Soap as you would any other liquid soap. You can apply CHG directly to the skin and wash gently with a scrungie or a clean washcloth.   Apply the CHG Soap to your body ONLY FROM THE NECK DOWN.  Do not use on open wounds or open sores. Avoid contact with your eyes, ears, mouth and genitals (private parts). Wash Face and genitals (private parts)  with your normal soap.   Wash thoroughly, paying special attention to the area where your surgery will be performed.  Thoroughly rinse your body with warm water from the neck down.  DO NOT shower/wash with your normal soap after using and rinsing off the CHG Soap.  Pat yourself dry with a CLEAN TOWEL.  Wear CLEAN PAJAMAS to bed the night before surgery  Place CLEAN SHEETS on your bed the  night before your surgery  DO NOT SLEEP WITH PETS.   Day of Surgery:  Take a shower with CHG soap. Wear Clean/Comfortable clothing the morning of surgery Do not apply any deodorants/lotions.   Remember to brush your teeth WITH YOUR REGULAR TOOTHPASTE.    If you received a COVID test during your pre-op visit, it is requested that you wear a mask when out in public, stay away from anyone that may not be feeling well, and notify your surgeon if you develop symptoms. If you have been in contact with anyone that has tested positive in the last 10 days, please notify your surgeon.    Please read over the following fact sheets that you were given.

## 2022-11-21 ENCOUNTER — Encounter (HOSPITAL_COMMUNITY)
Admission: RE | Admit: 2022-11-21 | Discharge: 2022-11-21 | Disposition: A | Discharge status: Home / Self Care | Payer: Medicare PPO | Source: Ambulatory Visit | Attending: General Surgery | Admitting: General Surgery

## 2022-11-21 ENCOUNTER — Other Ambulatory Visit: Payer: Self-pay

## 2022-11-21 ENCOUNTER — Encounter (HOSPITAL_COMMUNITY): Payer: Self-pay

## 2022-11-21 VITALS — BP 158/80 | HR 74 | Temp 97.8°F | Resp 18 | Ht 65.0 in | Wt 200.0 lb

## 2022-11-21 DIAGNOSIS — E119 Type 2 diabetes mellitus without complications: Secondary | ICD-10-CM | POA: Diagnosis not present

## 2022-11-21 DIAGNOSIS — K219 Gastro-esophageal reflux disease without esophagitis: Secondary | ICD-10-CM | POA: Diagnosis not present

## 2022-11-21 DIAGNOSIS — I10 Essential (primary) hypertension: Secondary | ICD-10-CM | POA: Insufficient documentation

## 2022-11-21 DIAGNOSIS — I341 Nonrheumatic mitral (valve) prolapse: Secondary | ICD-10-CM | POA: Insufficient documentation

## 2022-11-21 DIAGNOSIS — G4733 Obstructive sleep apnea (adult) (pediatric): Secondary | ICD-10-CM | POA: Diagnosis not present

## 2022-11-21 DIAGNOSIS — K760 Fatty (change of) liver, not elsewhere classified: Secondary | ICD-10-CM | POA: Insufficient documentation

## 2022-11-21 DIAGNOSIS — D242 Benign neoplasm of left breast: Secondary | ICD-10-CM | POA: Insufficient documentation

## 2022-11-21 DIAGNOSIS — Z01818 Encounter for other preprocedural examination: Secondary | ICD-10-CM | POA: Diagnosis not present

## 2022-11-21 DIAGNOSIS — K589 Irritable bowel syndrome without diarrhea: Secondary | ICD-10-CM | POA: Insufficient documentation

## 2022-11-21 HISTORY — DX: Rheumatic tricuspid insufficiency: I07.1

## 2022-11-21 HISTORY — DX: Fatty (change of) liver, not elsewhere classified: K76.0

## 2022-11-21 LAB — COMPREHENSIVE METABOLIC PANEL
ALT: 31 U/L (ref 0–44)
AST: 34 U/L (ref 15–41)
Albumin: 3.8 g/dL (ref 3.5–5.0)
Alkaline Phosphatase: 67 U/L (ref 38–126)
Anion gap: 7 (ref 5–15)
BUN: 20 mg/dL (ref 8–23)
CO2: 27 mmol/L (ref 22–32)
Calcium: 9.3 mg/dL (ref 8.9–10.3)
Chloride: 104 mmol/L (ref 98–111)
Creatinine, Ser: 0.8 mg/dL (ref 0.44–1.00)
GFR, Estimated: >60 mL/min (ref >60)
Glucose, Bld: 109 mg/dL — ABNORMAL HIGH (ref 70–99)
Potassium: 4 mmol/L (ref 3.5–5.1)
Sodium: 138 mmol/L (ref 135–145)
Total Bilirubin: 0.3 mg/dL (ref 0.3–1.2)
Total Protein: 6.8 g/dL (ref 6.5–8.1)

## 2022-11-21 LAB — GLUCOSE, CAPILLARY: Glucose-Capillary: 138 mg/dL — ABNORMAL HIGH (ref 70–99)

## 2022-11-21 LAB — HEMOGLOBIN A1C
Hgb A1c MFr Bld: 6 % — ABNORMAL HIGH (ref 4.8–5.6)
Mean Plasma Glucose: 125.5 mg/dL

## 2022-11-21 LAB — CBC
HCT: 39.8 % (ref 36.0–46.0)
Hemoglobin: 12.4 g/dL (ref 12.0–15.0)
MCH: 27.9 pg (ref 26.0–34.0)
MCHC: 31.2 g/dL (ref 30.0–36.0)
MCV: 89.4 fL (ref 80.0–100.0)
Platelets: 233 10*3/uL (ref 150–400)
RBC: 4.45 MIL/uL (ref 3.87–5.11)
RDW: 13.9 % (ref 11.5–15.5)
WBC: 5.3 10*3/uL (ref 4.0–10.5)
nRBC: 0 % (ref 0.0–0.2)

## 2022-11-21 NOTE — Progress Notes (Signed)
PCP - Deborra Medina Cardiologist - Dr. Edwin Dada- last seen in 2015- pt was told to follow up if needed per patient.   PPM/ICD - denies   Chest x-ray - N/A EKG - 11/21/2022  Stress Test - 2009?- results listed in Dr Tula Nakayama notes in care everywhere. Pt reports these tests ECHO - 2009? Cardiac Cath -2009?   Sleep Study - pt reports having "mild sleep apnea" and does not wear a CPAP   Fasting Blood Sugar - Pt states she is DM2 but does not take any medications fort his. Pt checks her blood sugar daily at home. Pt reports that her blood sugar is normally around 90. A1c done today.   Last dose of GLP1 agonist-  N/A  Blood Thinner Instructions: N/A Aspirin Instructions:N/A  ERAS Protcol -ERAS + G2   COVID TEST- N/A   Anesthesia review: pt with hx MVP and tricuspid regurgitation per patient. Pt last saw Dr. Edwin Dada in 2015. Pt states she had a ECHO, stress test and cath done 2008/2009 at [facility] in [place]. Pt denies any recent cardiac symptoms or issues. Ronnell Gu[place] with pt at PAT appointment.   Pt BP 186/78 upon arrival at PAT, 158/80 after appointment. Pt reports she checks her BP at home and systolic BP is normally around 140. Pt takes her Losartan at night.   Pt is  [employer] and states that she does not accept blood products. Pt states she is unsure if she will accept Albumin, and wants to wait to talk to Dr. Barry Dienes again prior to signing blood product refusal form. Pt provided her Advanced directives at PAT appointment. Copy placed in paper chart and scanned into media.   Patient denies shortness of breath, fever, cough and chest pain at PAT appointment   All instructions explained to the patient, with a verbal understanding of the material. Patient agrees to go over the instructions while at home for a better understanding. The opportunity to ask questions was provided.

## 2022-11-22 NOTE — Progress Notes (Signed)
Anesthesia APP PAT Evaluation:  Case: [id number] Date/Time: 11/29/22 0715   Procedure: LEFT BREAST LUMPECTOMY WITH RADIOACTIVE SEED LOCALIZATION (Left: Breast)   Anesthesia type: General   Pre-op diagnosis: LEFT BREAST PAPILLOMA   Location: [facility] OR ROOM 02 / [place] [facility]   Surgeons: Stark Klein, MD       DISCUSSION: Patient is a 72 year old female scheduled for the above procedure. She had prior history of breast lumpectomies for intraductal papilloma, now recurrent left breast but one is palpable and associated with some nipple discharge, so excision recommended.   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 She is a Restaurant manager, fast food.  I evaluated her during PAT given history of mild MVP with mild MR/TR by 2008 echo. She is not followed by cardiology but has routine follow-up with PCP Dr. Derrel Nip whose notes do not document a murmur. Ms. Mcclatchy notes that she had had chronic DOE with activities like walking up a steep incline or several stairs since when she was first diagnosed with MVP ~ 2008. She says cardiologist told her she could return if her symptoms worsened, but she feels they have been stable. She has tachycardia/heart pounding when she tries to go up inclines, but denied associated syncope or chest pain. She tries to walk around her neighborhood daily and does so for at least 30 minutes without CV symptoms--occasionally does up to 60 minutes. She says she has mild intermittent chronic LE edema. EKG stable since 06/06/16 showing NSR, possible LAE, low voltage QRS, septal infarct (age undetermined). No conversational dyspnea.   Patient and provider wore face masks. Heart RRR. I did not appreciate a murmur or click. Lungs clear. No carotid bruits noted. Little to no ankle edema. She denied limited neck or  mouth opening. Denied loose teeth.   She is a Restaurant manager, fast food. 11/21/22 CBC was normal. (She prefers to sign refusal of blood products form after she has spoken again with Dr. Barry Dienes.) A1c 6.0%.  RSL is scheduled for 11/28/22. Anesthesia team to evaluate on the day of surgery.   VS: BP (!) 158/80   Pulse 74   Temp 36.6 C   Resp 18   Ht '5\' 5"'$  (1.651 m)   Wt 90.7 kg   SpO2 100%   BMI 33.28 kg/m    PROVIDERS: [name], MD is PCP  - She is not routinely followed by cardiology but has a remote history of evaluations by Neoma Laming, MD ([place]) and most recently in 2015 by Cloretta Ned, MD ([place]) for uncontrolled HTN and PVCs which had resolved. See DISCUSSION and CV for more details.    LABS: Labs reviewed: Acceptable for surgery. (all labs ordered are listed, but only abnormal results are displayed)  Labs Reviewed  GLUCOSE, CAPILLARY - Abnormal; Notable for the following components:      Result Value   Glucose-Capillary 138 (*)    All other components within normal limits  COMPREHENSIVE METABOLIC PANEL - Abnormal; Notable for the following components:   Glucose, Bld 109 (*)    All other components within normal limits  HEMOGLOBIN A1C - Abnormal; Notable for the following components:   Hgb A1c MFr Bld 6.0 (*)    All other components within normal limits  CBC    EKG: 11/21/22: Normal sinus rhythm Possible Left atrial enlargement Low voltage QRS Septal  infarct , age undetermined Abnormal ECG No significant change since last tracing Confirmed by Vernell Leep ([id number]) on 11/21/2022 11:41:28 AM   CV: Per 02/06/14 [facility] Cardiology note by Dr. Edwin Dada: "PVCs onset about a 4 years ago with a completely negative work-up with no records here. Work-up done in [place] by Dr. Humphrey Rolls. 2008 Echocardiogram (outside facility): Mild concentric LVH. Normal LVEF. Mild MR/TR. Mild MVP. 12/2009 Nuclear stress test: Peak blood pressure 210/86. EF 76%. Normal test."   She  also reported a normal cardiac cath, unsure what year but prior to seeing Dr. Edwin Dada in 2015. His notes suggest cath was around 2009.   Nuclear stress test 03/12/07: IMPRESSION:  1)Normal LEFT ventricular function.  2)Normal Myoview study.     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 2009: Reported workup at [facility] including ECHO, Stress Test and Cardiac Cath; records N/A.  ECHO with mild LVH, mild MVP; stress test and cath reportedly normal    Depression    Diabetes mellitus without complication ([place])    type 2   Edema    Fatty liver    Patient reports that MD in the past put this on her medical record   GERD (gastroesophageal reflux disease)    Hypertension    IBS (irritable bowel syndrome)    Mild tricuspid regurgitation    Mitral valve prolapse    Patient is Jehovah's Witness    Sleep apnea    MILD-NO CPAP     Past Surgical History:  Procedure Laterality Date   ABDOMINAL HYSTERECTOMY  1985   BILATERAL SALPINGOOPHORECTOMY  2002   due to fh of ovarian ca   BREAST BIOPSY Right 03/09/2016   papilloma   BREAST BIOPSY Left 10/03/2022   [place] Core Bx, Ribbon Clip- path pending   BREAST BIOPSY Left 10/03/2022   [place] Core Bx Coil clip - path pending   BREAST BIOPSY Left 10/03/2022   [place] LT BREAST BX W LOC DEV 1ST LESION IMG BX SPEC US GUIDE 10/03/2022 [facility]   BREAST BIOPSY Left 10/03/2022   [place] LT BREAST BX W LOC DEV EA ADD LESION IMG BX SPEC US GUIDE 10/03/2022 [facility]   BREAST BIOPSY Left 10/12/2022   Left Breast Stereo Bx X clip path pending   BREAST BIOPSY Left 10/12/2022   MM LT BREAST BX W LOC DEV 1ST LESION IMAGE BX SPEC STEREO GUIDE 10/12/2022 [facility]   BREAST LUMPECTOMY Right 06/08/2016   Procedure: BREAST LUMPECTOMY WITH ULTRASOUND IN O.R;  Surgeon: Christene Lye, MD;  Location: [facility];  Service: General;  Laterality: Right;    BREAST SURGERY Left 02/26/2013   subareloar duct excision and mass   COLONOSCOPY  2012   COLONOSCOPY WITH PROPOFOL N/A 04/09/2021   Procedure: COLONOSCOPY WITH PROPOFOL;  Surgeon: Lesly Rubenstein, MD;  Location: [facility];  Service: Endoscopy;  Laterality: N/A;   ESOPHAGOGASTRODUODENOSCOPY (EGD) WITH PROPOFOL N/A 04/09/2021   Procedure: ESOPHAGOGASTRODUODENOSCOPY (EGD) WITH PROPOFOL;  Surgeon: Lesly Rubenstein, MD;  Location: [facility];  Service: Endoscopy;  Laterality: N/A;  DM   EXCISION / BIOPSY BREAST / NIPPLE / DUCT Right 1985   negative   EXCISION / BIOPSY BREAST / NIPPLE / DUCT Left 02/26/2013   intraductal papilloma   EXCISION / BIOPSY BREAST / NIPPLE / DUCT Right 06/09/2016   TONSILLECTOMY      MEDICATIONS:  OVER THE COUNTER MEDICATION   Accu-Chek Softclix Lancets lancets   acetaminophen (TYLENOL) 500 MG tablet   Ascorbic Acid (VITAMIN C PO)   azelastine (ASTELIN) 0.1 % nasal spray   B Complex-Biotin-FA (TH VITAMIN B 50/B-COMPLEX PO)   blood glucose meter kit and supplies KIT   blood glucose meter kit and supplies   Blood Glucose Monitoring Suppl (ACCU-CHEK AVIVA PLUS) w/Device KIT   Cholecalciferol (VITAMIN D3) 10 MCG (400 UNIT) tablet   Chromium 200 MCG CAPS   gabapentin (NEURONTIN) 100 MG capsule   glucose blood (ACCU-CHEK AVIVA PLUS) test strip   hydrochlorothiazide (HYDRODIURIL) 25 MG tablet   hydrocortisone (ANUSOL-HC) 25 MG suppository   losartan (COZAAR) 100 MG tablet   Magnesium 250 MG TABS   Pantothenic Acid 500 MG TABS   Potassium 99 MG TABS   triamcinolone cream (KENALOG) 0.1 %   vitamin A 10000 UT capsule   vitamin E 180 MG (400 UNITS) capsule   Zinc 30 MG TABS   No current facility-administered medications for this encounter.    Myra Gianotti, PA-C Surgical Short Stay/Anesthesiology [facility] Phone [phone] [facility] Phone [phone] 11/22/2022 1:04 PM

## 2022-11-22 NOTE — Anesthesia Preprocedure Evaluation (Addendum)
Anesthesia Evaluation  Patient identified by MRN, date of birth, ID band Patient awake    Reviewed: Allergy & Precautions, NPO status , Patient's Chart, lab work & pertinent test results  Airway Mallampati: III  TM Distance: >3 FB Neck ROM: Full    Dental no notable dental hx. (+) Teeth Intact, Dental Advisory Given   Pulmonary sleep apnea    Pulmonary exam normal breath sounds clear to auscultation       Cardiovascular hypertension, Pt. on medications Normal cardiovascular exam Rhythm:Regular Rate:Normal  Per 02/06/14 [facility] Cardiology note by Dr. Edwin Dada: "PVCs onset about a 4 years ago with a completely negative work-up with no records here. Work-up done in [place] by Dr. Humphrey Rolls. 2008 Echocardiogram (outside facility): Mild concentric LVH. Normal LVEF. Mild MR/TR. Mild MVP. 12/2009 Nuclear stress test: Peak blood pressure 210/86. EF 76%. Normal test."    She also reported a normal cardiac cath, unsure what year but prior to seeing Dr. Edwin Dada in 2015. His notes suggest cath was around 2009.    Nuclear stress test 03/12/07: IMPRESSION:  1)Normal LEFT ventricular function.  2)Normal Myoview study.     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 She is a Restaurant manager, fast food.   Reproductive/Obstetrics                             Anesthesia Physical Anesthesia Plan  ASA: 3  Anesthesia Plan: General   Post-op Pain Management:  Tylenol PO (pre-op)*   Induction: Intravenous  PONV Risk Score and Plan: 3 and Ondansetron, Dexamethasone and Treatment may vary due to age or medical condition  Airway Management Planned: LMA  Additional Equipment:   Intra-op Plan:   Post-operative Plan: Extubation in OR  Informed Consent: I have reviewed the patients History and Physical, chart, labs and discussed the procedure including the risks, benefits and alternatives for the proposed anesthesia with the patient or authorized representative who has indicated his/her understanding and acceptance.     Dental advisory given  Plan Discussed with: CRNA  Anesthesia Plan Comments: (PAT note written 11/22/2022 by Myra Gianotti, PA-C.  )       Anesthesia Quick Evaluation

## 2022-11-28 ENCOUNTER — Ambulatory Visit
Admission: RE | Admit: 2022-11-28 | Discharge: 2022-11-28 | Disposition: A | Discharge status: Home / Self Care | Payer: Medicare PPO | Source: Ambulatory Visit | Attending: General Surgery | Admitting: General Surgery

## 2022-11-28 DIAGNOSIS — D242 Benign neoplasm of left breast: Secondary | ICD-10-CM

## 2022-11-28 DIAGNOSIS — N6322 Unspecified lump in the left breast, upper inner quadrant: Secondary | ICD-10-CM | POA: Diagnosis not present

## 2022-11-28 HISTORY — PX: BREAST BIOPSY: SHX20

## 2022-11-28 NOTE — Progress Notes (Signed)
patient voiced understanding of new arrival time of 0700 tomorrow , clear liquids and ERAS drink okay until 0600.

## 2022-11-28 NOTE — H&P (Signed)
REFERRING PHYSICIAN: Deborra Medina, MD  PROVIDER: Georgianne Fick, MD  Care Team: Patient Care Team: Jannifer Hick, MD as PCP - General (Internal Medicine) Little Ishikawa, PA as Consulting Provider (Cardiovascular Disease) Georgianne Fick, MD as Consulting Provider (Surgical Oncology)  MRN: [id number] DOB: 1951/05/24 DATE OF ENCOUNTER: 11/08/2022  Subjective  Chief Complaint: New Consultation and Breast Problem  History of Present Illness: Kari Lyons is a 72 y.o. female who is seen today as an office consultation at the request of Dr. Derrel Nip for evaluation of New Consultation and Breast Problem  Patient is a 72 year old female who presents with new left breast papillomas. The patient developed nipple discharge around 6 months ago. This was sometimes clear and sometimes bloody. She has had this in the past. She required surgery in the 1990s as well as 2014 and 2017. She has required surgery on both breasts for the same problem. The two in 2014 and 2017 were done by Dr. Jamal Collin in [place].  This time she also felt a palpable mass. She underwent imaging which showed 3 adjacent intraductal masses between 630 and 7:00. One was 4 mm, 1 was 5 mm, and the other 1 was more amorphous and felt like it could possibly be debris. The 630 o'clock location was where the palpable mass was. 2 of the 3 biopsies were positive for intraductal papilloma. She is referred to discuss excision.  Of note, her sister had ovarian cancer. She was followed by Dr. Vickki Muff at [facility] for a while. She has now had bilateral oophorectomy and is followed with bimanual exam annually by Dr. Derrel Nip.  Family cancer history: sister with ovarian cancer, paternal grandmother with breast cancer, two maternal aunts with breast cancer, brother with multiple myeloma.  Diagnostic mammogram/us: 09/12/2022 [facility] ACR Breast Density Category b: There are scattered areas of fibroglandular  density.  FINDINGS: There are no new dominant masses, suspicious calcifications or secondary signs of malignancy within either breast. There are stable postsurgical changes of each breast.  On physical exam, patient directs me to a palpable lump in the periareolar LEFT breast, 6:30 o'clock axis.  LEFT breast: Targeted ultrasound is performed, evaluating the subareolar LEFT breast and the lower inner periareolar of the LEFT breast corresponding to the site of a palpable lump. There is a corresponding intraductal mass versus masslike debris in the periareolar LEFT breast, 6:30 o'clock axis, measuring 4 mm.  There is an additional adjacent hypoechoic mass versus masslike debris at a confluence of periareolar ducts, again at the 6:30 o'clock axis, without definite internal vascularity.  Lastly, there is an intraductal mass versus masslike debris in the LEFT breast at the 7 o'clock axis, 1-2 cm from the nipple, with questionable internal vascularity, measuring 5 x 4 x 4 mm.  LEFT axilla was evaluated with ultrasound showing no enlarged or morphologically abnormal lymph nodes.  RIGHT breast: Targeted ultrasound is performed, evaluating the upper-outer quadrant of the RIGHT breast as directed by the patient, showing only normal fibroglandular tissues and fat lobules throughout.  IMPRESSION: 1. Within the periareolar LEFT breast, 6:30 o'clock to 7 o'clock axis, there are three adjacent intraductal hypoechoic masses versus masslike debris. The most central (periareolar) intraductal mass versus masslike debris, 6:30 o'clock axis, corresponds to the site of a palpable lump, measuring 4 mm. The most peripheral intraductal mass versus masslike debris is at the 7 o'clock axis, 1-2 cm from the nipple, with questionable internal vascularity, measuring 5 x 4 x 4 mm. The third possible  intraductal mass versus masslike debris is between these 2 sites. Recommend ultrasound-guided biopsies  (2 sites) for the most central and most peripheral findings described above. 2. No evidence of malignancy within the RIGHT breast.  RECOMMENDATION: 1. Ultrasound-guided biopsies (2 sites) for the most central intraductal mass versus masslike debris at the 6:30 o'clock axis of the periareolar LEFT breast (site of a palpable lump) and the most peripheral intraductal mass versus masslike debris at the 7 o'clock axis of the LEFT breast, 1-2 cm from nipple, with questionable internal vascularity. 2. If the pathology results of either mass do not explain patient's suspicious nipple discharge, a subsequent breast MRI and/or central duct excision should then be considered.  Ultrasound-guided biopsies will be scheduled at patient's convenience.  I have discussed the findings and recommendations with the patient. If applicable, a reminder letter will be sent to the patient regarding the next appointment.  BI-RADS CATEGORY 4: Suspicious.  Pathology core needle biopsy: 10/12/2022 A. BREAST DISTORTION, LEFT UPPER INNER MIDDLE DEPTH; STEREOTACTIC BIOPSY: - INTRADUCTAL PAPILLOMA WITH APOCRINE METAPLASIA AND FOCAL SCLEROSIS. - NEGATIVE FOR ATYPIA AND MALIGNANCY.  Path biopsy 10/03/2022 A. BREAST, LEFT AT 630 O'CLOCK, RETROAREOLAR; ULTRASOUND-GUIDED CORE NEEDLE BIOPSY: - FRAGMENTS OF BENIGN INTRADUCTAL PAPILLOMA WITH FOCAL USUAL DUCTAL HYPERPLASIA. - NO EVIDENCE OF ATYPICAL PROLIFERATIVE BREAST DISEASE IN THIS SAMPLE.  Comment: Conservative excision is recommended for definitive classification of this process and exclusion of an atypical proliferation.  B. BREAST, LEFT AT 7 O'CLOCK, 2 CM FROM THE NIPPLE; ULTRASOUND-GUIDED CORE NEEDLE BIOPSY: - FIBROEPITHELIAL PROLIFERATION WITH SCLEROSIS. - BACKGROUND MAMMARY PARENCHYMA WITH FIBROCYSTIC CHANGES AND USUAL DUCTAL HYPERPLASIA.  Review of Systems: A complete review of systems was obtained from the patient. I have reviewed this information  and discussed as appropriate with the patient. See HPI as well for other ROS. ROS: Hearing loss, ringing ears, nasal congestions, neck pain, depression  Medical History: Past Medical History: Diagnosis Date Arrhythmia Arthritis Beat, premature ventricular 2009 Overview: A. 2009: Reported workup at [place] including ECHO, Stress Test and Cardiac Cath; records N/A. ECHO with mild LVH, mild MVP; stress test and cath reportedly normal Depression (emotion) Diabetes mellitus without complication (CMS-HCC) Edema GERD (gastroesophageal reflux disease) History of anemia Hypertension IBS (irritable bowel syndrome) Mitral valve prolapse Obesity (BMI 30.0-34.9), unspecified Patient is Jehovah's Witness Sleep apnea  Patient Active Problem List Diagnosis PVC's (premature ventricular contractions) Hypertension Cardiac risk counseling Papilloma of left breast Bloody discharge from left nipple Family history of cancer  Past Surgical History: Procedure Laterality Date BREAST EXCISIONAL BIOPSY Right 1985 negative bilateral salpingoophorectomy 2002 due to fh of ovarian cancer COLONOSCOPY 10/05/2011 Dr. Kennis Carina @ [facility] - melanosis, Int. Hemorrh., rpt 10 yrs per [facility]TE breast surgery Left 02/26/2013 subareloar duct excision and mass BREAST EXCISIONAL BIOPSY Left 02/26/2013 intraductal papilloma BREAST EXCISIONAL BIOPSY Right 03/09/2016 papilloma breast lumpectomy Right 06/08/2016 BREAST LUMPECTOMY WITH ULTRASOUND IN O.R; Surgeon: Christene Lye, MD BREAST EXCISIONAL BIOPSY Right 06/09/2016 COLONOSCOPY 04/09/2021 Normal colon/Repeat 12yr/CTL EGD 04/09/2021 Normal EGD/Repeat as needed/CTL ABDOMINAL HYSTERECTOMY TONSILLECTOMY   Allergies Allergen Reactions Aloe Vera Rash Tetanus Toxoid Adsorbed Other (See Comments)  Current Outpatient Medications on File Prior to Visit Medication Sig Dispense Refill *enzymes,digestive oral 1 tab by mouth with each meal ACCU-CHEK AVIVA  PLUS TEST STRP test strip 1 strip once daily ACCU-CHEK SOFTCLIX LANCETS lancets USE 1 LANCET TO CHECK GLUCOSE ONCE DAILY ascorbic Acid (VITAMIN C) 500 mg CpER 1 cap by mouth 3 times a day ascorbic acid, vitamin C, (VITAMIN C) 100 MG  tablet Take by mouth. azelastine (ASTELIN) 137 mcg nasal spray USE 2 SPRAY(S) IN EACH NOSTRIL TWICE DAILY AS DIRECTED chromium picolinate 200 mcg Cap Take by mouth. dicyclomine (BENTYL) 10 mg capsule Take by mouth. famotidine (PEPCID) 20 MG tablet Take by mouth. fluticasone (FLONASE) 50 mcg/actuation nasal spray by Nasal route. losartan (COZAAR) 25 MG tablet Take by mouth. mupirocin (BACTROBAN) 2 % ointment Place into one nostril pantothenic acid, vit B5, (PANTOTHENIC ACID ORAL) Take 1 capsule by mouth once daily. triamcinolone 0.1 % cream Apply 1 Application topically 2 (two) times daily zinc gluconate 30 mg Tab Take by mouth  No current facility-administered medications on file prior to visit.  Family History Problem Relation Age of Onset Parkinsonism Father High blood pressure (Hypertension) Mother   Social History  Tobacco Use Smoking Status Never Smokeless Tobacco Never   Social History  Socioeconomic History Marital status: Married Number of children: 1 Occupational History Occupation: homemaker Tobacco Use Smoking status: Never Smokeless tobacco: Never Substance and Sexual Activity Alcohol use: No Drug use: No Sexual activity: Yes Partners: Male Social History Narrative Jehova Witness     Objective:  Vitals: 11/08/22 0844 BP: 138/65 Pulse: 78 Temp: 36.1 C (97 F) SpO2: 98% Weight: 91.2 kg (201 lb) Height: 165.1 cm (5' 5"$ )  Body mass index is 33.45 kg/m.  Gen: No acute distress. Well nourished and well groomed. Neurological: Alert and oriented to person, place, and time. Coordination normal. Head: Normocephalic and atraumatic. Eyes: Conjunctivae are normal. Pupils are equal, round, and reactive to light. No  scleral icterus. Neck: Normal range of motion. Neck supple. No tracheal deviation or thyromegaly present. Cardiovascular: Normal rate, regular rhythm, normal heart sounds and intact distal pulses. Exam reveals no gallop and no friction rub. No murmur heard. Breast: left breast smaller than right. Well healed lateral circumareolar incision. 1 cm palpable mass lower inner quadrant under areola. No other palpable masses. I cannot express any nipple discharge. No LAD, slight nipple retraction (this has been there since post op 2017 per pt). Right breast benign. Respiratory: Effort normal. No respiratory distress. No chest wall tenderness. Breath sounds normal. No wheezes, rales or rhonchi. GI: Soft. Bowel sounds are normal. The abdomen is soft and nontender. There is no rebound and no guarding. Musculoskeletal: Normal range of motion. Extremities are nontender. Lymphadenopathy: No cervical, preauricular, postauricular or axillary adenopathy is present Skin: Skin is warm and dry. No rash noted. No diaphoresis. No erythema. No pallor. No clubbing, cyanosis, or edema. Psychiatric: Normal mood and affect. Behavior is normal. Judgment and thought content normal.  Labs N/a  Assessment and Plan:  ICD-10-CM 1. Family history of cancer Z80.9  2. Bloody discharge from left nipple N64.52  3. Papilloma of left breast D24.2   These papillomas are small, but they are associated with nipple discharge and one of them is palpable. I do recommend excision. We discussed the pros and cons of MRI. She states she also discussed this with radiology at the time of one of her biopsies. This felt that she would have high risk of having many lesions which would lead to additional biopsies and most likely not lead to a diagnosis of cancer.  Will plan to do seed localized excision on these regions. The 1 closest to the nipple is palpable. I think if the 1 farthest from the nipple is also localized, I would be able to take  the tissue between the 2 regions. I discussed the procedure with the patient. I discussed seed placement.  The  surgical procedure was described to the patient. I discussed the incision type and location and that we will need radiology involved with a seed marker.  We discussed the risks bleeding, infection, damage to other structures, need for further procedures/surgeries. We discussed the risk of seroma. The patient was advised if the breast has cancer, we may need to go back to surgery for additional tissue to obtain negative margins or for a lymph node biopsy. The patient was advised that these are the most common complications, but that others can occur as well. I discussed the risk of alteration in breast contour or size. I discussed risk of chronic pain. There are rare instances of heart/lung issues post op as well as blood clots.  They were advised against taking aspirin or other anti-inflammatory agents/blood thinners the week before surgery.  The risks and benefits of the procedure were described to the patient and she wishes to proceed.  We are sending genetic testing today. Should she have a positive result, we will send her to the cancer center to discuss.

## 2022-11-29 ENCOUNTER — Ambulatory Visit
Admission: RE | Admit: 2022-11-29 | Discharge: 2022-11-29 | Disposition: A | Discharge status: Home / Self Care | Payer: Medicare PPO | Source: Ambulatory Visit | Attending: General Surgery | Admitting: General Surgery

## 2022-11-29 ENCOUNTER — Other Ambulatory Visit: Payer: Self-pay

## 2022-11-29 ENCOUNTER — Encounter (HOSPITAL_COMMUNITY)
Admission: RE | Disposition: A | Discharge status: Home / Self Care | Payer: Self-pay | Source: Home / Self Care | Attending: General Surgery

## 2022-11-29 ENCOUNTER — Ambulatory Visit (HOSPITAL_COMMUNITY)
Admission: RE | Admit: 2022-11-29 | Discharge: 2022-11-29 | Disposition: A | Discharge status: Home / Self Care | Payer: Medicare PPO | Attending: General Surgery | Admitting: General Surgery

## 2022-11-29 ENCOUNTER — Ambulatory Visit (HOSPITAL_BASED_OUTPATIENT_CLINIC_OR_DEPARTMENT_OTHER): Payer: Medicare PPO | Admitting: Anesthesiology

## 2022-11-29 ENCOUNTER — Ambulatory Visit (HOSPITAL_COMMUNITY): Payer: Medicare PPO | Admitting: Vascular Surgery

## 2022-11-29 DIAGNOSIS — N6032 Fibrosclerosis of left breast: Secondary | ICD-10-CM | POA: Diagnosis not present

## 2022-11-29 DIAGNOSIS — G4730 Sleep apnea, unspecified: Secondary | ICD-10-CM | POA: Insufficient documentation

## 2022-11-29 DIAGNOSIS — D242 Benign neoplasm of left breast: Secondary | ICD-10-CM | POA: Diagnosis not present

## 2022-11-29 DIAGNOSIS — N6452 Nipple discharge: Secondary | ICD-10-CM | POA: Insufficient documentation

## 2022-11-29 DIAGNOSIS — I119 Hypertensive heart disease without heart failure: Secondary | ICD-10-CM

## 2022-11-29 DIAGNOSIS — R928 Other abnormal and inconclusive findings on diagnostic imaging of breast: Secondary | ICD-10-CM | POA: Diagnosis not present

## 2022-11-29 DIAGNOSIS — K219 Gastro-esophageal reflux disease without esophagitis: Secondary | ICD-10-CM | POA: Insufficient documentation

## 2022-11-29 DIAGNOSIS — I10 Essential (primary) hypertension: Secondary | ICD-10-CM | POA: Insufficient documentation

## 2022-11-29 DIAGNOSIS — Z803 Family history of malignant neoplasm of breast: Secondary | ICD-10-CM | POA: Insufficient documentation

## 2022-11-29 DIAGNOSIS — E119 Type 2 diabetes mellitus without complications: Secondary | ICD-10-CM | POA: Insufficient documentation

## 2022-11-29 DIAGNOSIS — Z8041 Family history of malignant neoplasm of ovary: Secondary | ICD-10-CM | POA: Diagnosis not present

## 2022-11-29 DIAGNOSIS — M1990 Unspecified osteoarthritis, unspecified site: Secondary | ICD-10-CM | POA: Diagnosis not present

## 2022-11-29 DIAGNOSIS — N6022 Fibroadenosis of left breast: Secondary | ICD-10-CM | POA: Insufficient documentation

## 2022-11-29 DIAGNOSIS — Z807 Family history of other malignant neoplasms of lymphoid, hematopoietic and related tissues: Secondary | ICD-10-CM | POA: Insufficient documentation

## 2022-11-29 DIAGNOSIS — N62 Hypertrophy of breast: Secondary | ICD-10-CM | POA: Diagnosis not present

## 2022-11-29 HISTORY — PX: BREAST LUMPECTOMY WITH RADIOACTIVE SEED LOCALIZATION: SHX6424

## 2022-11-29 LAB — GLUCOSE, CAPILLARY
Glucose-Capillary: 106 mg/dL — ABNORMAL HIGH (ref 70–99)
Glucose-Capillary: 114 mg/dL — ABNORMAL HIGH (ref 70–99)
Glucose-Capillary: 131 mg/dL — ABNORMAL HIGH (ref 70–99)

## 2022-11-29 LAB — NO BLOOD PRODUCTS

## 2022-11-29 SURGERY — BREAST LUMPECTOMY WITH RADIOACTIVE SEED LOCALIZATION
Anesthesia: General | Site: Breast | Laterality: Left

## 2022-11-29 MED ORDER — FENTANYL CITRATE (PF) 250 MCG/5ML IJ SOLN
INTRAMUSCULAR | Status: AC
  Filled 2022-11-29: qty 5

## 2022-11-29 MED ORDER — PHENYLEPHRINE 80 MCG/ML (10ML) SYRINGE FOR IV PUSH (FOR BLOOD PRESSURE SUPPORT)
PREFILLED_SYRINGE | INTRAVENOUS | Status: DC | PRN
  Administered 2022-11-29: 80 ug via INTRAVENOUS
  Administered 2022-11-29: 160 ug via INTRAVENOUS

## 2022-11-29 MED ORDER — OXYCODONE HCL 5 MG PO TABS: 5.0000 mg | ORAL_TABLET | Freq: Four times a day (QID) | ORAL | 0 refills | Status: DC | PRN

## 2022-11-29 MED ORDER — DEXAMETHASONE SODIUM PHOSPHATE 10 MG/ML IJ SOLN
INTRAMUSCULAR | Status: DC | PRN
  Administered 2022-11-29: 4 mg via INTRAVENOUS

## 2022-11-29 MED ORDER — 0.9 % SODIUM CHLORIDE (POUR BTL) OPTIME
TOPICAL | Status: DC | PRN
  Administered 2022-11-29: 1000 mL

## 2022-11-29 MED ORDER — ACETAMINOPHEN 500 MG PO TABS
1000.0000 mg | ORAL_TABLET | Freq: Once | ORAL | Status: AC
  Administered 2022-11-29: 1000 mg via ORAL
  Filled 2022-11-29: qty 2

## 2022-11-29 MED ORDER — LIDOCAINE-EPINEPHRINE 1 %-1:100000 IJ SOLN
INTRAMUSCULAR | Status: AC
  Filled 2022-11-29: qty 1

## 2022-11-29 MED ORDER — PHENYLEPHRINE HCL-NACL 20-0.9 MG/250ML-% IV SOLN
INTRAVENOUS | Status: DC | PRN
  Administered 2022-11-29: 25 ug/min via INTRAVENOUS

## 2022-11-29 MED ORDER — CEFAZOLIN SODIUM-DEXTROSE 2-4 GM/100ML-% IV SOLN
2.0000 g | INTRAVENOUS | Status: AC
  Administered 2022-11-29: 2 g via INTRAVENOUS
  Filled 2022-11-29: qty 100

## 2022-11-29 MED ORDER — ENSURE PRE-SURGERY PO LIQD: 296.0000 mL | Freq: Once | ORAL | Status: DC

## 2022-11-29 MED ORDER — ACETAMINOPHEN 500 MG PO TABS: 1000.0000 mg | ORAL_TABLET | ORAL | Status: DC

## 2022-11-29 MED ORDER — INSULIN ASPART 100 UNIT/ML IJ SOLN: 0.0000 [IU] | INTRAMUSCULAR | Status: DC | PRN

## 2022-11-29 MED ORDER — LIDOCAINE 2% (20 MG/ML) 5 ML SYRINGE
INTRAMUSCULAR | Status: AC
  Filled 2022-11-29: qty 5

## 2022-11-29 MED ORDER — EPHEDRINE SULFATE-NACL 50-0.9 MG/10ML-% IV SOSY
PREFILLED_SYRINGE | INTRAVENOUS | Status: DC | PRN
  Administered 2022-11-29: 5 mg via INTRAVENOUS
  Administered 2022-11-29: 10 mg via INTRAVENOUS

## 2022-11-29 MED ORDER — PROPOFOL 10 MG/ML IV BOLUS
INTRAVENOUS | Status: AC
  Filled 2022-11-29: qty 20

## 2022-11-29 MED ORDER — ONDANSETRON HCL 4 MG/2ML IJ SOLN
INTRAMUSCULAR | Status: DC | PRN
  Administered 2022-11-29: 4 mg via INTRAVENOUS

## 2022-11-29 MED ORDER — CHLORHEXIDINE GLUCONATE 0.12 % MT SOLN
15.0000 mL | Freq: Once | OROMUCOSAL | Status: AC
  Administered 2022-11-29: 15 mL via OROMUCOSAL
  Filled 2022-11-29: qty 15

## 2022-11-29 MED ORDER — DEXAMETHASONE SODIUM PHOSPHATE 10 MG/ML IJ SOLN
INTRAMUSCULAR | Status: AC
  Filled 2022-11-29: qty 1

## 2022-11-29 MED ORDER — CHLORHEXIDINE GLUCONATE CLOTH 2 % EX PADS: 6.0000 | MEDICATED_PAD | Freq: Once | CUTANEOUS | Status: DC

## 2022-11-29 MED ORDER — CEFAZOLIN SODIUM-DEXTROSE 2-4 GM/100ML-% IV SOLN
INTRAVENOUS | Status: AC
  Filled 2022-11-29: qty 100

## 2022-11-29 MED ORDER — ONDANSETRON HCL 4 MG/2ML IJ SOLN
INTRAMUSCULAR | Status: AC
  Filled 2022-11-29: qty 2

## 2022-11-29 MED ORDER — LIDOCAINE-EPINEPHRINE 1 %-1:100000 IJ SOLN
INTRAMUSCULAR | Status: DC | PRN
  Administered 2022-11-29: 40 mL

## 2022-11-29 MED ORDER — FENTANYL CITRATE (PF) 100 MCG/2ML IJ SOLN
25.0000 ug | INTRAMUSCULAR | Status: DC | PRN
  Administered 2022-11-29: 25 ug via INTRAVENOUS

## 2022-11-29 MED ORDER — ROCURONIUM BROMIDE 10 MG/ML (PF) SYRINGE
PREFILLED_SYRINGE | INTRAVENOUS | Status: AC
  Filled 2022-11-29: qty 10

## 2022-11-29 MED ORDER — LACTATED RINGERS IV SOLN: INTRAVENOUS | Status: DC

## 2022-11-29 MED ORDER — TRANEXAMIC ACID-NACL 1000-0.7 MG/100ML-% IV SOLN
INTRAVENOUS | Status: AC
  Filled 2022-11-29: qty 100

## 2022-11-29 MED ORDER — ORAL CARE MOUTH RINSE: 15.0000 mL | Freq: Once | OROMUCOSAL | Status: AC

## 2022-11-29 MED ORDER — SUCCINYLCHOLINE CHLORIDE 200 MG/10ML IV SOSY
PREFILLED_SYRINGE | INTRAVENOUS | Status: AC
  Filled 2022-11-29: qty 10

## 2022-11-29 MED ORDER — FENTANYL CITRATE (PF) 250 MCG/5ML IJ SOLN
INTRAMUSCULAR | Status: DC | PRN
  Administered 2022-11-29: 50 ug via INTRAVENOUS

## 2022-11-29 MED ORDER — LIDOCAINE 2% (20 MG/ML) 5 ML SYRINGE
INTRAMUSCULAR | Status: DC | PRN
  Administered 2022-11-29: 60 mg via INTRAVENOUS

## 2022-11-29 MED ORDER — BUPIVACAINE HCL (PF) 0.25 % IJ SOLN
INTRAMUSCULAR | Status: AC
  Filled 2022-11-29: qty 30

## 2022-11-29 MED ORDER — PROPOFOL 10 MG/ML IV BOLUS
INTRAVENOUS | Status: DC | PRN
  Administered 2022-11-29: 150 mg via INTRAVENOUS

## 2022-11-29 MED ORDER — FENTANYL CITRATE (PF) 100 MCG/2ML IJ SOLN
INTRAMUSCULAR | Status: AC
  Filled 2022-11-29: qty 2

## 2022-11-29 SURGICAL SUPPLY — 43 items
BAG COUNTER SPONGE SURGICOUNT (BAG) ×1 IMPLANT
BINDER BREAST LRG (GAUZE/BANDAGES/DRESSINGS) IMPLANT
BINDER BREAST XLRG (GAUZE/BANDAGES/DRESSINGS) IMPLANT
BINDER BREAST XXLRG (GAUZE/BANDAGES/DRESSINGS) IMPLANT
BLADE SURG 10 STRL SS (BLADE) ×1 IMPLANT
CANISTER SUCT 3000ML PPV (MISCELLANEOUS) IMPLANT
CHLORAPREP W/TINT 26 (MISCELLANEOUS) ×1 IMPLANT
CLIP VESOCCLUDE LG 6/CT (CLIP) ×1 IMPLANT
COVER PROBE W GEL 5X96 (DRAPES) ×1 IMPLANT
COVER SURGICAL LIGHT HANDLE (MISCELLANEOUS) ×1 IMPLANT
DERMABOND ADVANCED .7 DNX12 (GAUZE/BANDAGES/DRESSINGS) ×1 IMPLANT
DEVICE DUBIN SPECIMEN MAMMOGRA (MISCELLANEOUS) ×1 IMPLANT
DRAPE CHEST BREAST 15X10 FENES (DRAPES) ×1 IMPLANT
ELECT COATED BLADE 2.86 ST (ELECTRODE) ×1 IMPLANT
ELECT REM PT RETURN 9FT ADLT (ELECTROSURGICAL) ×1
ELECTRODE REM PT RTRN 9FT ADLT (ELECTROSURGICAL) ×1 IMPLANT
GAUZE PAD ABD 8X10 STRL (GAUZE/BANDAGES/DRESSINGS) ×1 IMPLANT
GAUZE SPONGE 4X4 12PLY STRL (GAUZE/BANDAGES/DRESSINGS) IMPLANT
GAUZE SPONGE 4X4 12PLY STRL LF (GAUZE/BANDAGES/DRESSINGS) ×1 IMPLANT
GLOVE BIO SURGEON STRL SZ 6 (GLOVE) ×1 IMPLANT
GLOVE INDICATOR 6.5 STRL GRN (GLOVE) ×1 IMPLANT
GOWN STRL REUS W/ TWL LRG LVL3 (GOWN DISPOSABLE) ×1 IMPLANT
GOWN STRL REUS W/ TWL XL LVL3 (GOWN DISPOSABLE) ×1 IMPLANT
GOWN STRL REUS W/TWL LRG LVL3 (GOWN DISPOSABLE) ×1
GOWN STRL REUS W/TWL XL LVL3 (GOWN DISPOSABLE) ×1
KIT BASIN OR (CUSTOM PROCEDURE TRAY) ×1 IMPLANT
KIT MARKER MARGIN INK (KITS) ×1 IMPLANT
LIGHT WAVEGUIDE WIDE FLAT (MISCELLANEOUS) IMPLANT
NDL HYPO 25GX1X1/2 BEV (NEEDLE) ×1 IMPLANT
NEEDLE HYPO 25GX1X1/2 BEV (NEEDLE) ×1 IMPLANT
NS IRRIG 1000ML POUR BTL (IV SOLUTION) IMPLANT
PACK GENERAL/GYN (CUSTOM PROCEDURE TRAY) ×1 IMPLANT
PAD ABD 8X10 STRL (GAUZE/BANDAGES/DRESSINGS) IMPLANT
STRIP CLOSURE SKIN 1/2X4 (GAUZE/BANDAGES/DRESSINGS) ×1 IMPLANT
SUT MNCRL AB 4-0 PS2 18 (SUTURE) ×1 IMPLANT
SUT SILK 2 0 SH (SUTURE) IMPLANT
SUT VIC AB 2-0 SH 27 (SUTURE) ×1
SUT VIC AB 2-0 SH 27XBRD (SUTURE) ×1 IMPLANT
SUT VIC AB 3-0 SH 27 (SUTURE) ×1
SUT VIC AB 3-0 SH 27X BRD (SUTURE) ×1 IMPLANT
SYR CONTROL 10ML LL (SYRINGE) ×1 IMPLANT
TOWEL GREEN STERILE (TOWEL DISPOSABLE) ×1 IMPLANT
TOWEL GREEN STERILE FF (TOWEL DISPOSABLE) ×1 IMPLANT

## 2022-11-29 NOTE — Transfer of Care (Signed)
Immediate Anesthesia Transfer of Care Note  Patient: Kari Lyons  Procedure(s) Performed: LEFT BREAST LUMPECTOMY WITH RADIOACTIVE SEED LOCALIZATION (Left: Breast)  Patient Location: PACU  Anesthesia Type:General  Level of Consciousness: awake, oriented, and patient cooperative  Airway & Oxygen Therapy: Patient Spontanous Breathing and Patient connected to nasal cannula oxygen  Post-op Assessment: Report given to RN and Post -op Vital signs reviewed and stable  Post vital signs: Reviewed and stable  Last Vitals:  Vitals Value Taken Time  BP    Temp    Pulse    Resp    SpO2      Last Pain:  Vitals:   11/29/22 0745  TempSrc:   PainSc: 0-No pain         Complications: No notable events documented.

## 2022-11-29 NOTE — Discharge Instructions (Addendum)
[facility] Office Phone Number [phone]  BREAST BIOPSY/ PARTIAL MASTECTOMY: POST OP INSTRUCTIONS  Always review your discharge instruction sheet given to you by the facility where your surgery was performed.  IF YOU HAVE DISABILITY OR FAMILY LEAVE FORMS, YOU MUST BRING THEM TO THE OFFICE FOR PROCESSING.  DO NOT GIVE THEM TO YOUR DOCTOR.  Take 2 tylenol (acetominophen) three times a day for 3 days.  If you still have pain, add ibuprofen with food in between if able to take this (if you have kidney issues or stomach issues, do not take ibuprofen).  If both of those are not enough, add the narcotic pain pill.  If you find you are needing a lot of this overnight after surgery, call the next morning for a refill.    Prescriptions will not be filled after 5pm or on week-ends. Take your usually prescribed medications unless otherwise directed You should eat very light the first 24 hours after surgery, such as soup, crackers, pudding, etc.  Resume your normal diet the day after surgery. Most patients will experience some swelling and bruising in the breast.  Ice packs and a good support bra will help.  Swelling and bruising can take several days to resolve.  It is common to experience some constipation if taking pain medication after surgery.  Increasing fluid intake and taking a stool softener will usually help or prevent this problem from occurring.  A mild laxative (Milk of Magnesia or Miralax) should be taken according to package directions if there are no bowel movements after 48 hours. Unless discharge instructions indicate otherwise, you may remove your bandages 48 hours after surgery, and you may shower at that time.  You may have steri-strips (small skin tapes) in place directly over the incision.  These strips should be left on the skin at least for for 7-10 days.    ACTIVITIES:  You may resume regular daily activities (gradually increasing) beginning the next day.  Wearing a  good support bra or sports bra (or the breast binder) minimizes pain and swelling.  You may have sexual intercourse when it is comfortable. No heavy lifting for 1-2 weeks (not over around 10 pounds).  You may drive when you no longer are taking prescription pain medication, you can comfortably wear a seatbelt, and you can safely maneuver your car and apply brakes. RETURN TO WORK:  __________3-14 days depending on job. _______________ You should see your doctor in the office for a follow-up appointment approximately two weeks after your surgery.  Your doctor's nurse will typically make your follow-up appointment when she calls you with your pathology report.  Expect your pathology report 3-4 business days after your surgery.  You may call to check if you do not hear from us after three days.   WHEN TO CALL YOUR DOCTOR: Fever over 101.0 Nausea and/or vomiting. Extreme swelling or bruising. Continued bleeding from incision. Increased pain, redness, or drainage from the incision.  The clinic staff is available to answer your questions during regular business hours.  Please don't hesitate to call and ask to speak to one of the nurses for clinical concerns.  If you have a medical emergency, go to the nearest emergency room or call 911.  A surgeon from [facility] is always on call at the hospital.  For further questions, please visit [email or web address]   

## 2022-11-29 NOTE — Anesthesia Postprocedure Evaluation (Signed)
Anesthesia Post Note  Patient: Kari Lyons  Procedure(s) Performed: LEFT BREAST LUMPECTOMY WITH RADIOACTIVE SEED LOCALIZATION (Left: Breast)     Patient location during evaluation: PACU Anesthesia Type: General Level of consciousness: awake and alert Pain management: pain level controlled Vital Signs Assessment: post-procedure vital signs reviewed and stable Respiratory status: spontaneous breathing, nonlabored ventilation, respiratory function stable and patient connected to nasal cannula oxygen Cardiovascular status: blood pressure returned to baseline and stable Postop Assessment: no apparent nausea or vomiting Anesthetic complications: no  No notable events documented.  Last Vitals:  Vitals:   11/29/22 1115 11/29/22 1130  BP: (!) 157/73 (!) 158/74  Pulse: 79 75  Resp: 10 12  Temp:  36.4 C  SpO2: 99% 98%    Last Pain:  Vitals:   11/29/22 1115  TempSrc:   PainSc: Asleep                 Katerin Negrete L Almando Brawley

## 2022-11-29 NOTE — Anesthesia Procedure Notes (Signed)
Procedure Name: LMA Insertion Date/Time: 11/29/2022 9:36 AM  Performed by: Lowella Dell, CRNAPre-anesthesia Checklist: Patient identified, Emergency Drugs available, Suction available and Patient being monitored Patient Re-evaluated:Patient Re-evaluated prior to induction Oxygen Delivery Method: Circle System Utilized Preoxygenation: Pre-oxygenation with 100% oxygen Induction Type: IV induction Ventilation: Mask ventilation without difficulty LMA: LMA inserted LMA Size: 4.0 Number of attempts: 1 Airway Equipment and Method: Bite block Placement Confirmation: positive ETCO2 Tube secured with: Tape Dental Injury: Teeth and Oropharynx as per pre-operative assessment

## 2022-11-29 NOTE — Interval H&P Note (Signed)
History and Physical Interval Note:  11/29/2022 9:10 AM  Kari Lyons  has presented today for surgery, with the diagnosis of LEFT BREAST PAPILLOMA.  The various methods of treatment have been discussed with the patient and family. After consideration of risks, benefits and other options for treatment, the patient has consented to  Procedure(s): LEFT BREAST LUMPECTOMY WITH RADIOACTIVE SEED LOCALIZATION (Left) as a surgical intervention.  The patient's history has been reviewed, patient examined, no change in status, stable for surgery.  I have reviewed the patient's chart and labs.  Questions were answered to the patient's satisfaction.     Stark Klein

## 2022-11-29 NOTE — Op Note (Signed)
Left Breast Radioactive seed localized lumpectomy  Indications: This patient presents with history of left nipple discharge and abnormal left breast imaging with papilloma on core needle biopsy.  She has h/o other papillomas.    Pre-operative Diagnosis: papilloma left breast and left nipple discharge  Post-operative Diagnosis: Same  Surgeon: Stark Klein   Assist:  Pryor Curia, RNFA  Anesthesia: General endotracheal anesthesia  ASA Class: 3  Procedure Details  The patient was seen in the Holding Room. The risks, benefits, complications, treatment options, and expected outcomes were discussed with the patient. The possibilities of bleeding, infection, the need for additional procedures, failure to diagnose a condition, and creating a complication requiring transfusion or operation were discussed with the patient. The patient concurred with the proposed plan, giving informed consent.  The site of surgery properly noted/marked. The patient was taken to Operating Room # 2, identified, and the procedure verified as left Breast seed localized lumpectomy. A Time Out was held and the above information confirmed.  The left breast and chest were prepped and draped in standard fashion. A superomedial circumareolar incision was made near the previously placed radioactive seed.  Dissection was carried down around the point of maximum signal intensity. The cautery was used to perform the dissection.   The specimen was inked with the margin marker paint kit.    Specimen radiography confirmed inclusion of the mammographic lesion, the clip, and the seed.  The background signal in the breast was zero.  Additional lateral and posterior margins were taken based on 3D specimen mammogram  One clip was placed in the breast cavity.  Hemostasis was achieved with cautery.  The wound was irrigated and closed with 3-0 vicryl interrupted deep dermal sutures and 4-0 monocryl running subcuticular suture.  Local was  infiltrated into the surrounding skin.     Sterile dressings were applied. At the end of the operation, all sponge, instrument, and needle counts were correct.  Findings: Seed, clip in specimen.  anterior margin is skin  Estimated Blood Loss:  min         Specimens: left breast tissue with seed, additional posterior margin, additional lateral margin.          Complications:  None; patient tolerated the procedure well.         Disposition: PACU - hemodynamically stable.         Condition: stable

## 2022-11-30 ENCOUNTER — Encounter (HOSPITAL_COMMUNITY): Payer: Self-pay | Admitting: General Surgery

## 2022-11-30 LAB — SURGICAL PATHOLOGY

## 2022-12-08 ENCOUNTER — Encounter (HOSPITAL_COMMUNITY): Payer: Self-pay

## 2022-12-20 DIAGNOSIS — D242 Benign neoplasm of left breast: Secondary | ICD-10-CM | POA: Diagnosis not present

## 2023-01-02 ENCOUNTER — Other Ambulatory Visit: Payer: Self-pay | Admitting: Internal Medicine

## 2023-01-02 DIAGNOSIS — E669 Obesity, unspecified: Secondary | ICD-10-CM

## 2023-01-23 ENCOUNTER — Other Ambulatory Visit (HOSPITAL_COMMUNITY): Payer: Self-pay | Admitting: General Surgery

## 2023-01-23 DIAGNOSIS — R2232 Localized swelling, mass and lump, left upper limb: Secondary | ICD-10-CM

## 2023-01-23 DIAGNOSIS — D242 Benign neoplasm of left breast: Secondary | ICD-10-CM

## 2023-01-31 ENCOUNTER — Other Ambulatory Visit: Payer: Self-pay | Admitting: Internal Medicine

## 2023-01-31 ENCOUNTER — Ambulatory Visit (HOSPITAL_COMMUNITY): Payer: Medicare PPO

## 2023-01-31 NOTE — Telephone Encounter (Signed)
Refilled: 02/14/2022 Last OV: 08/22/2022 Next OV: 02/20/2023

## 2023-02-03 DIAGNOSIS — K219 Gastro-esophageal reflux disease without esophagitis: Secondary | ICD-10-CM | POA: Diagnosis not present

## 2023-02-03 DIAGNOSIS — H259 Unspecified age-related cataract: Secondary | ICD-10-CM | POA: Diagnosis not present

## 2023-02-03 DIAGNOSIS — E1136 Type 2 diabetes mellitus with diabetic cataract: Secondary | ICD-10-CM | POA: Diagnosis not present

## 2023-02-03 DIAGNOSIS — K589 Irritable bowel syndrome without diarrhea: Secondary | ICD-10-CM | POA: Diagnosis not present

## 2023-02-03 DIAGNOSIS — J309 Allergic rhinitis, unspecified: Secondary | ICD-10-CM | POA: Diagnosis not present

## 2023-02-03 DIAGNOSIS — E11620 Type 2 diabetes mellitus with diabetic dermatitis: Secondary | ICD-10-CM | POA: Diagnosis not present

## 2023-02-03 DIAGNOSIS — M1990 Unspecified osteoarthritis, unspecified site: Secondary | ICD-10-CM | POA: Diagnosis not present

## 2023-02-03 DIAGNOSIS — E669 Obesity, unspecified: Secondary | ICD-10-CM | POA: Diagnosis not present

## 2023-02-03 DIAGNOSIS — I10 Essential (primary) hypertension: Secondary | ICD-10-CM | POA: Diagnosis not present

## 2023-02-13 ENCOUNTER — Other Ambulatory Visit (INDEPENDENT_AMBULATORY_CARE_PROVIDER_SITE_OTHER): Payer: Medicare PPO

## 2023-02-13 DIAGNOSIS — E785 Hyperlipidemia, unspecified: Secondary | ICD-10-CM | POA: Diagnosis not present

## 2023-02-13 DIAGNOSIS — E1169 Type 2 diabetes mellitus with other specified complication: Secondary | ICD-10-CM

## 2023-02-13 DIAGNOSIS — E119 Type 2 diabetes mellitus without complications: Secondary | ICD-10-CM | POA: Diagnosis not present

## 2023-02-13 LAB — COMPREHENSIVE METABOLIC PANEL
ALT: 29 U/L (ref 0–35)
AST: 30 U/L (ref 0–37)
Albumin: 4.1 g/dL (ref 3.5–5.2)
Alkaline Phosphatase: 72 U/L (ref 39–117)
BUN: 21 mg/dL (ref 6–23)
CO2: 28 mEq/L (ref 19–32)
Calcium: 9.4 mg/dL (ref 8.4–10.5)
Chloride: 105 mEq/L (ref 96–112)
Creatinine, Ser: 0.87 mg/dL (ref 0.40–1.20)
GFR: 66.4 mL/min (ref >60.00)
Glucose, Bld: 99 mg/dL (ref 70–99)
Potassium: 4.5 mEq/L (ref 3.5–5.1)
Sodium: 139 mEq/L (ref 135–145)
Total Bilirubin: 0.5 mg/dL (ref 0.2–1.2)
Total Protein: 6.9 g/dL (ref 6.0–8.3)

## 2023-02-13 LAB — LIPID PANEL
Cholesterol: 140 mg/dL (ref 0–200)
HDL: 46.1 mg/dL (ref >39.00)
LDL Cholesterol: 79 mg/dL (ref 0–99)
NonHDL: 93.79
Total CHOL/HDL Ratio: 3
Triglycerides: 76 mg/dL (ref 0.0–149.0)
VLDL: 15.2 mg/dL (ref 0.0–40.0)

## 2023-02-13 LAB — LDL CHOLESTEROL, DIRECT: Direct LDL: 88 mg/dL

## 2023-02-13 LAB — HEMOGLOBIN A1C: Hgb A1c MFr Bld: 6.3 % (ref 4.6–6.5)

## 2023-02-20 ENCOUNTER — Ambulatory Visit: Payer: Medicare PPO | Admitting: Internal Medicine

## 2023-02-20 ENCOUNTER — Encounter: Payer: Self-pay | Admitting: Internal Medicine

## 2023-02-20 VITALS — BP 146/68 | HR 80 | Temp 97.9°F | Ht 65.0 in | Wt 201.0 lb

## 2023-02-20 DIAGNOSIS — I152 Hypertension secondary to endocrine disorders: Secondary | ICD-10-CM | POA: Diagnosis not present

## 2023-02-20 DIAGNOSIS — E119 Type 2 diabetes mellitus without complications: Secondary | ICD-10-CM

## 2023-02-20 DIAGNOSIS — E785 Hyperlipidemia, unspecified: Secondary | ICD-10-CM

## 2023-02-20 DIAGNOSIS — E1159 Type 2 diabetes mellitus with other circulatory complications: Secondary | ICD-10-CM | POA: Diagnosis not present

## 2023-02-20 DIAGNOSIS — N6321 Unspecified lump in the left breast, upper outer quadrant: Secondary | ICD-10-CM

## 2023-02-20 DIAGNOSIS — Z6833 Body mass index (BMI) 33.0-33.9, adult: Secondary | ICD-10-CM | POA: Diagnosis not present

## 2023-02-20 DIAGNOSIS — E669 Obesity, unspecified: Secondary | ICD-10-CM

## 2023-02-20 DIAGNOSIS — E1169 Type 2 diabetes mellitus with other specified complication: Secondary | ICD-10-CM

## 2023-02-20 DIAGNOSIS — I10 Essential (primary) hypertension: Secondary | ICD-10-CM

## 2023-02-20 MED ORDER — AMLODIPINE BESYLATE 2.5 MG PO TABS: 2.5000 mg | ORAL_TABLET | Freq: Every day | ORAL | 1 refills | Status: DC

## 2023-02-20 MED ORDER — LOSARTAN POTASSIUM 100 MG PO TABS: 100.0000 mg | ORAL_TABLET | Freq: Every day | ORAL | 1 refills | Status: DC

## 2023-02-20 NOTE — Progress Notes (Unsigned)
Subjective:  Patient ID: Kari Lyons, female    DOB: Sep 16, 1951  Age: 72 y.o. MRN: [id number]  CC: The primary encounter diagnosis was Obesity, diabetes, and hypertension syndrome (HCC). Diagnoses of Hyperlipidemia associated with type 2 diabetes mellitus (HCC), Mass of upper outer quadrant of left breast, and Essential hypertension were also pertinent to this visit.   HPI Kari Lyons presents for  Chief Complaint  Patient presents with   Medical Management of Chronic Issues   1 Type 2 DM: diet controlled.  She  feels generally well,  I s walking  regularlyandr trying to lose weight. Checking  blood sugars once daily at variable times, BS have been under 120 fasting  Denies any recent hypoglyemic events.  Taking   medications as directed. Following a carbohydrate modified diet 6 days per week. Denies numbness, burning and tingling of extremities. Appetite is good.    2) hypertension. Stopped hctz due to muscle cramps .  taking losartan 100 mg . Home readings have been elevated   4) history of recurrent papilloma: left breast excision ,  benign path.  Recovery complicated by seroma.  Breast still tender. Reviewed last gen surg visit.   5) left shoulder sub q mass:  mentioned to gen surg at breast follow up on left breast seroma.  Denies  breast pain and redness.   Byerly ordered an MRI   She is scheduled for May 18    Outpatient Medications Prior to Visit  Medication Sig Dispense Refill   Accu-Chek Softclix Lancets lancets USE 1 LANCET TO CHECK GLUCOSE ONCE DAILY 100 each 0   acetaminophen (TYLENOL) 500 MG tablet Take 1,000 mg by mouth every 6 (six) hours as needed for moderate pain or mild pain.     Ascorbic Acid (VITAMIN C PO) Take 1,500 mg by mouth 2 (two) times daily. Buffered  C Complex 750 mg each     azelastine (ASTELIN) 0.1 % nasal spray USE 2 SPRAY(S) IN EACH NOSTRIL TWICE DAILY AS DIRECTED (Patient taking differently: Place 2 sprays into both nostrils at bedtime.) 30 mL  5   B Complex-Biotin-FA (TH VITAMIN B 50/B-COMPLEX PO) Take 1 tablet by mouth daily.     blood glucose meter kit and supplies KIT Use once daily to check fasting and post prandial blood sugars 1 each 0   blood glucose meter kit and supplies Dispense based on patient and insurance preference. Use up to four times daily as directed. (FOR ICD-10 E10.9, E11.9). 1 each 0   Blood Glucose Monitoring Suppl (ACCU-CHEK AVIVA PLUS) w/Device KIT 1 Device by Does not apply route daily. 1 kit 0   Cholecalciferol (VITAMIN D3) 10 MCG (400 UNIT) tablet Take 400 Units by mouth daily.     Chromium 200 MCG CAPS Take 200 mcg by mouth daily.     gabapentin (NEURONTIN) 100 MG capsule Take 1 capsule (100 mg total) by mouth 3 (three) times daily. 90 capsule 3   glucose blood (ACCU-CHEK AVIVA PLUS) test strip USED AS DIRECTED TO TEST BLOOD SUGAR ONCE A DAY.  Dx code: E11.69 100 strip 11   Magnesium 250 MG TABS Take 250 mg by mouth daily.     OVER THE COUNTER MEDICATION Nettle Tea Chamomile Tea     oxyCODONE (OXY IR/ROXICODONE) 5 MG immediate release tablet Take 1 tablet (5 mg total) by mouth every 6 (six) hours as needed for severe pain. 5 tablet 0   Pantothenic Acid 500 MG TABS Take 500 mg by mouth  daily.     Potassium 99 MG TABS Take by mouth.     triamcinolone cream (KENALOG) 0.1 % Apply 1 Application topically daily as needed (Eczema). 80 g 2   vitamin A 16109 UT capsule Take 10,000 Units by mouth daily.     vitamin E 180 MG (400 UNITS) capsule Take 400 Units by mouth daily.     Zinc 30 MG TABS Take 30 mg by mouth daily.     losartan (COZAAR) 100 MG tablet Take 1 tablet (100 mg total) by mouth daily. 90 tablet 1   hydrochlorothiazide (HYDRODIURIL) 25 MG tablet Take 1 tablet (25 mg total) by mouth daily. (Patient not taking: Reported on 11/17/2022) 90 tablet 3   hydrocortisone (ANUSOL-HC) 25 MG suppository Place 1 suppository (25 mg total) rectally 2 (two) times daily. (Patient not taking: Reported on 11/17/2022) 12  suppository 5   No facility-administered medications prior to visit.    Review of Systems;  Patient denies headache, fevers, malaise, unintentional weight loss, skin rash, eye pain, sinus congestion and sinus pain, sore throat, dysphagia,  hemoptysis , cough, dyspnea, wheezing, chest pain, palpitations, orthopnea, edema, abdominal pain, nausea, melena, diarrhea, constipation, flank pain, dysuria, hematuria, urinary  Frequency, nocturia, numbness, tingling, seizures,  Focal weakness, Loss of consciousness,  Tremor, insomnia, depression, anxiety, and suicidal ideation.      Objective:  BP (!) 146/68   Pulse 80   Temp 97.9 F (36.6 C) (Oral)   Ht 5\' 5"  (1.651 m)   Wt 201 lb (91.2 kg)   SpO2 95%   BMI 33.45 kg/m   BP Readings from Last 3 Encounters:  02/20/23 (!) 146/68  11/29/22 (!) 158/74  11/21/22 (!) 158/80    Wt Readings from Last 3 Encounters:  02/20/23 201 lb (91.2 kg)  11/21/22 200 lb (90.7 kg)  08/22/22 196 lb 3.2 oz (89 kg)    Physical Exam Vitals reviewed.  Constitutional:      General: She is not in acute distress.    Appearance: Normal appearance. She is normal weight. She is not ill-appearing, toxic-appearing or diaphoretic.  HENT:     Head: Normocephalic.  Eyes:     General: No scleral icterus.       Right eye: No discharge.        Left eye: No discharge.     Conjunctiva/sclera: Conjunctivae normal.  Cardiovascular:     Rate and Rhythm: Normal rate and regular rhythm.     Heart sounds: Normal heart sounds.  Pulmonary:     Effort: Pulmonary effort is normal. No respiratory distress.     Breath sounds: Normal breath sounds.  Musculoskeletal:        General: Normal range of motion.  Skin:    General: Skin is warm and dry.  Neurological:     General: No focal deficit present.     Mental Status: She is alert and oriented to person, place, and time. Mental status is at baseline.  Psychiatric:        Mood and Affect: Mood normal.        Behavior:  Behavior normal.        Thought Content: Thought content normal.        Judgment: Judgment normal.     Lab Results  Component Value Date   HGBA1C 6.3 02/13/2023   HGBA1C 6.0 (H) 11/21/2022   HGBA1C 6.3 08/16/2022    Lab Results  Component Value Date   CREATININE 0.87 02/13/2023   CREATININE 0.80 11/21/2022  CREATININE 0.94 08/16/2022    Lab Results  Component Value Date   WBC 5.3 11/21/2022   HGB 12.4 11/21/2022   HCT 39.8 11/21/2022   PLT 233 11/21/2022   GLUCOSE 99 02/13/2023   CHOL 140 02/13/2023   TRIG 76.0 02/13/2023   HDL 46.10 02/13/2023   LDLDIRECT 88.0 02/13/2023   LDLCALC 79 02/13/2023   ALT 29 02/13/2023   AST 30 02/13/2023   NA 139 02/13/2023   K 4.5 02/13/2023   CL 105 02/13/2023   CREATININE 0.87 02/13/2023   BUN 21 02/13/2023   CO2 28 02/13/2023   TSH 2.12 08/16/2022   HGBA1C 6.3 02/13/2023   MICROALBUR <0.7 08/16/2022    MM Breast Surgical Specimen  Result Date: 11/29/2022 CLINICAL DATA:  Excision of a left breast lesion following radioactive seed localization. Evaluate surgical specimen. EXAM: SPECIMEN RADIOGRAPH OF THE LEFT BREAST COMPARISON:  Previous exam(s). FINDINGS: Status post excision of the left breast. The radioactive seed and biopsy marker clip are present, completely intact, and were marked for pathology. IMPRESSION: Specimen radiograph of the left breast. Electronically Signed   By: Amie Portland M.D.   On: 11/29/2022 10:31   Assessment & Plan:  .Obesity, diabetes, and hypertension syndrome (HCC) Assessment & Plan:  Her diabetes remains  well-controlled on diet alone .   Patient is up-to-date on eye exams and foot exam is normal today. Patient Diagnosed in August 2018 with an A1c of 6.5 , diet controlled since then.  She has no microalbuminuria. Patient has continued to defer statin therapy for CAD risk reduction despite her LDL> 70,  And is taking ACE/ARB for renal protection and hypertension  With normal readings,  Normal cr and  lytes.   Lab Results  Component Value Date   HGBA1C 6.3 02/13/2023   Lab Results  Component Value Date   LABMICR See below: 10/30/2015   LABMICR See below: 10/13/2015   MICROALBUR <0.7 08/16/2022   MICROALBUR <0.7 08/16/2021    Lab Results  Component Value Date   CHOL 140 02/13/2023   HDL 46.10 02/13/2023   LDLCALC 79 02/13/2023   LDLDIRECT 88.0 02/13/2023   TRIG 76.0 02/13/2023   CHOLHDL 3 02/13/2023    Lab Results  Component Value Date   CREATININE 0.87 02/13/2023   Lab Results  Component Value Date   NA 139 02/13/2023   K 4.5 02/13/2023   CL 105 02/13/2023   CO2 28 02/13/2023        Orders: -     Comprehensive metabolic panel; Future -     Hemoglobin A1c; Future  Hyperlipidemia associated with type 2 diabetes mellitus (HCC) -     Lipid panel; Future -     LDL cholesterol, direct; Future  Mass of upper outer quadrant of left breast Assessment & Plan: Excised by Almond Lint,  benign path report reviewed today with patient    Essential hypertension Assessment & Plan: Not at goal without hctz (stopped by patient due to recurrent muscle cramps)  adding amlodipine 2.5 mg daily  continue losartan 100 mg    Other orders -     Losartan Potassium; Take 1 tablet (100 mg total) by mouth daily.  Dispense: 90 tablet; Refill: 1 -     amLODIPine Besylate; Take 1 tablet (2.5 mg total) by mouth daily.  Dispense: 90 tablet; Refill: 1     I provided 34 minutes of face-to-face time during this encounter reviewing patient's last visit with me, patient's  most recent visit with  general surgery  recent surgical and non surgical procedures, previous  labs and imaging studies, counseling on currently addressed issues,  and post visit ordering to diagnostics and therapeutics .   Follow-up: Return in about 3 months (around 05/23/2023).   Sherlene Shams, MD

## 2023-02-20 NOTE — Patient Instructions (Addendum)
Your blood pressure is too high .  Please continue losartan at night and add amlodipine in the morning  If your BP is not at goal of 130/80 or less after one week,  let me know   Breakfast :  You might want to try a premixed protein drink .  There are many brands.  .  I like the  Premier Protein shake It is less $$$ and very low sugar.  160 cal  30 g protein  1 g sugar 50% calcium needs   Here are several Non whey based protein shakes that are low in sugar  :  1) OWYN ("only what you need" )  2) VEGA 3) ORGAIN   FOR Lunch:  Healthy Choice "low carb power bowl"  entrees and  "Steamer" entrees are are great low carb entrees that microwave in 5 minutes  much lower in carbs than Lean Cuisine       Dinner:   Mixed Greens,  protein   You need 60 ounces of water daily   Exercise for 30 minutes  daily .    Nighttime snack:  keep  the yogurt (plain) with berries and nuts

## 2023-02-20 NOTE — Assessment & Plan Note (Addendum)
Excised by Almond Lint,  benign path

## 2023-02-20 NOTE — Assessment & Plan Note (Signed)
Her diabetes remains.  well-controlled on diet alone .   Patient is up-to-date on eye exams and foot exam is normal today. Patient Diagnosed in August 2018 with an A1c of 6.5 , diet controlled since then.  She has no microalbuminuria. Patient has continued to defer statin therapy for CAD risk reduction despite her LDL> 70,  And is taking ACE/ARB for renal protection and hypertension  With normal readings,  Normal cr and lytes.   Lab Results  Component Value Date   HGBA1C 6.3 02/13/2023   Lab Results  Component Value Date   LABMICR See below: 10/30/2015   LABMICR See below: 10/13/2015   MICROALBUR <0.7 08/16/2022   MICROALBUR <0.7 08/16/2021    Lab Results  Component Value Date   CHOL 140 02/13/2023   HDL 46.10 02/13/2023   LDLCALC 79 02/13/2023   LDLDIRECT 88.0 02/13/2023   TRIG 76.0 02/13/2023   CHOLHDL 3 02/13/2023    Lab Results  Component Value Date   CREATININE 0.87 02/13/2023   Lab Results  Component Value Date   NA 139 02/13/2023   K 4.5 02/13/2023   CL 105 02/13/2023   CO2 28 02/13/2023

## 2023-02-21 NOTE — Assessment & Plan Note (Signed)
Not at goal without hctz (stopped by patient due to recurrent muscle cramps)  adding amlodipine 2.5 mg daily  continue losartan 100 mg

## 2023-02-27 ENCOUNTER — Ambulatory Visit (HOSPITAL_COMMUNITY): Payer: Medicare PPO

## 2023-02-27 ENCOUNTER — Encounter (HOSPITAL_COMMUNITY): Payer: Self-pay

## 2023-03-06 ENCOUNTER — Ambulatory Visit (HOSPITAL_COMMUNITY)
Admission: RE | Admit: 2023-03-06 | Discharge: 2023-03-06 | Disposition: A | Discharge status: Home / Self Care | Payer: Medicare PPO | Source: Ambulatory Visit | Attending: General Surgery | Admitting: General Surgery

## 2023-03-06 DIAGNOSIS — D242 Benign neoplasm of left breast: Secondary | ICD-10-CM | POA: Insufficient documentation

## 2023-03-06 DIAGNOSIS — M19012 Primary osteoarthritis, left shoulder: Secondary | ICD-10-CM | POA: Diagnosis not present

## 2023-03-06 DIAGNOSIS — R2232 Localized swelling, mass and lump, left upper limb: Secondary | ICD-10-CM | POA: Insufficient documentation

## 2023-03-06 MED ORDER — GADOBUTROL 1 MMOL/ML IV SOLN
9.0000 mL | Freq: Once | INTRAVENOUS | Status: AC | PRN
  Administered 2023-03-06: 9 mL via INTRAVENOUS

## 2023-03-28 DIAGNOSIS — D242 Benign neoplasm of left breast: Secondary | ICD-10-CM | POA: Diagnosis not present

## 2023-03-28 DIAGNOSIS — R2232 Localized swelling, mass and lump, left upper limb: Secondary | ICD-10-CM | POA: Diagnosis not present

## 2023-03-28 DIAGNOSIS — Z809 Family history of malignant neoplasm, unspecified: Secondary | ICD-10-CM | POA: Diagnosis not present

## 2023-04-02 ENCOUNTER — Other Ambulatory Visit: Payer: Self-pay | Admitting: Internal Medicine

## 2023-04-02 DIAGNOSIS — I152 Hypertension secondary to endocrine disorders: Secondary | ICD-10-CM

## 2023-04-02 DIAGNOSIS — E1159 Type 2 diabetes mellitus with other circulatory complications: Secondary | ICD-10-CM

## 2023-05-22 ENCOUNTER — Ambulatory Visit (INDEPENDENT_AMBULATORY_CARE_PROVIDER_SITE_OTHER): Payer: Medicare PPO | Admitting: *Deleted

## 2023-05-22 VITALS — Ht 65.0 in | Wt 195.0 lb

## 2023-05-22 DIAGNOSIS — Z0000 Encounter for general adult medical examination without abnormal findings: Secondary | ICD-10-CM

## 2023-05-22 NOTE — Patient Instructions (Signed)
Kari Lyons , Thank you for taking time to come for your Medicare Wellness Visit. I appreciate your ongoing commitment to your health goals. Please review the following plan we discussed and let me know if I can assist you in the future.   Referrals/Orders/Follow-Ups/Clinician Recommendations: None  This is a list of the screening recommended for you and due dates:  Health Maintenance  Topic Date Due   DTaP/Tdap/Td vaccine (1 - Tdap) Never done   COVID-19 Vaccine (5 - 2023-24 season) 06/17/2022   Flu Shot  05/18/2023   Zoster (Shingles) Vaccine (1 of 2) 05/23/2023*   Pneumonia Vaccine (2 of 2 - PCV) 08/20/2023*   Hemoglobin A1C  08/15/2023   Yearly kidney health urinalysis for diabetes  08/17/2023   Eye exam for diabetics  08/27/2023   Mammogram  09/13/2023   Yearly kidney function blood test for diabetes  02/13/2024   Complete foot exam   02/20/2024   Medicare Annual Wellness Visit  05/21/2024   Colon Cancer Screening  04/10/2031   DEXA scan (bone density measurement)  Completed   Hepatitis C Screening  Completed   HPV Vaccine  Aged Out  *Topic was postponed. The date shown is not the original due date.    Advanced directives: (In Chart) A copy of your advanced directives are scanned into your chart should your provider ever need it.  Next Medicare Annual Wellness Visit scheduled for next year: Yes 05/27/24 @ 12:45  Preventive Care 65 Years and Older, Female Preventive care refers to lifestyle choices and visits with your health care provider that can promote health and wellness. What does preventive care include? A yearly physical exam. This is also called an annual well check. Dental exams once or twice a year. Routine eye exams. Ask your health care provider how often you should have your eyes checked. Personal lifestyle choices, including: Daily care of your teeth and gums. Regular physical activity. Eating a healthy diet. Avoiding tobacco and drug use. Limiting alcohol  use. Practicing safe sex. Taking low-dose aspirin every day. Taking vitamin and mineral supplements as recommended by your health care provider. What happens during an annual well check? The services and screenings done by your health care provider during your annual well check will depend on your age, overall health, lifestyle risk factors, and family history of disease. Counseling  Your health care provider may ask you questions about your: Alcohol use. Tobacco use. Drug use. Emotional well-being. Home and relationship well-being. Sexual activity. Eating habits. History of falls. Memory and ability to understand (cognition). Work and work Astronomer. Reproductive health. Screening  You may have the following tests or measurements: Height, weight, and BMI. Blood pressure. Lipid and cholesterol levels. These may be checked every 5 years, or more frequently if you are over 27 years old. Skin check. Lung cancer screening. You may have this screening every year starting at age 43 if you have a 30-pack-year history of smoking and currently smoke or have quit within the past 15 years. Fecal occult blood test (FOBT) of the stool. You may have this test every year starting at age 19. Flexible sigmoidoscopy or colonoscopy. You may have a sigmoidoscopy every 5 years or a colonoscopy every 10 years starting at age 85. Hepatitis C blood test. Hepatitis B blood test. Sexually transmitted disease (STD) testing. Diabetes screening. This is done by checking your blood sugar (glucose) after you have not eaten for a while (fasting). You may have this done every 1-3 years. Bone density scan.  This is done to screen for osteoporosis. You may have this done starting at age 20. Mammogram. This may be done every 1-2 years. Talk to your health care provider about how often you should have regular mammograms. Talk with your health care provider about your test results, treatment options, and if necessary,  the need for more tests. Vaccines  Your health care provider may recommend certain vaccines, such as: Influenza vaccine. This is recommended every year. Tetanus, diphtheria, and acellular pertussis (Tdap, Td) vaccine. You may need a Td booster every 10 years. Zoster vaccine. You may need this after age 63. Pneumococcal 13-valent conjugate (PCV13) vaccine. One dose is recommended after age 30. Pneumococcal polysaccharide (PPSV23) vaccine. One dose is recommended after age 24. Talk to your health care provider about which screenings and vaccines you need and how often you need them. This information is not intended to replace advice given to you by your health care provider. Make sure you discuss any questions you have with your health care provider. Document Released: 10/30/2015 Document Revised: 06/22/2016 Document Reviewed: 08/04/2015 Elsevier Interactive Patient Education  2017 [employer].  Fall Prevention in the Home Falls can cause injuries. They can happen to people of all ages. There are many things you can do to make your home safe and to help prevent falls. What can I do on the outside of my home? Regularly fix the edges of walkways and driveways and fix any cracks. Remove anything that might make you trip as you walk through a door, such as a raised step or threshold. Trim any bushes or trees on the path to your home. Use bright outdoor lighting. Clear any walking paths of anything that might make someone trip, such as rocks or tools. Regularly check to see if handrails are loose or broken. Make sure that both sides of any steps have handrails. Any raised decks and porches should have guardrails on the edges. Have any leaves, snow, or ice cleared regularly. Use sand or salt on walking paths during winter. Clean up any spills in your garage right away. This includes oil or grease spills. What can I do in the bathroom? Use night lights. Install grab bars by the toilet and in the  tub and shower. Do not use towel bars as grab bars. Use non-skid mats or decals in the tub or shower. If you need to sit down in the shower, use a plastic, non-slip stool. Keep the floor dry. Clean up any water that spills on the floor as soon as it happens. Remove soap buildup in the tub or shower regularly. Attach bath mats securely with double-sided non-slip rug tape. Do not have throw rugs and other things on the floor that can make you trip. What can I do in the bedroom? Use night lights. Make sure that you have a light by your bed that is easy to reach. Do not use any sheets or blankets that are too big for your bed. They should not hang down onto the floor. Have a firm chair that has side arms. You can use this for support while you get dressed. Do not have throw rugs and other things on the floor that can make you trip. What can I do in the kitchen? Clean up any spills right away. Avoid walking on wet floors. Keep items that you use a lot in easy-to-reach places. If you need to reach something above you, use a strong step stool that has a grab bar. Keep electrical cords  out of the way. Do not use floor polish or wax that makes floors slippery. If you must use wax, use non-skid floor wax. Do not have throw rugs and other things on the floor that can make you trip. What can I do with my stairs? Do not leave any items on the stairs. Make sure that there are handrails on both sides of the stairs and use them. Fix handrails that are broken or loose. Make sure that handrails are as long as the stairways. Check any carpeting to make sure that it is firmly attached to the stairs. Fix any carpet that is loose or worn. Avoid having throw rugs at the top or bottom of the stairs. If you do have throw rugs, attach them to the floor with carpet tape. Make sure that you have a light switch at the top of the stairs and the bottom of the stairs. If you do not have them, ask someone to add them for  you. What else can I do to help prevent falls? Wear shoes that: Do not have high heels. Have rubber bottoms. Are comfortable and fit you well. Are closed at the toe. Do not wear sandals. If you use a stepladder: Make sure that it is fully opened. Do not climb a closed stepladder. Make sure that both sides of the stepladder are locked into place. Ask someone to hold it for you, if possible. Clearly mark and make sure that you can see: Any grab bars or handrails. First and last steps. Where the edge of each step is. Use tools that help you move around (mobility aids) if they are needed. These include: Canes. Walkers. Scooters. Crutches. Turn on the lights when you go into a dark area. Replace any light bulbs as soon as they burn out. Set up your furniture so you have a clear path. Avoid moving your furniture around. If any of your floors are uneven, fix them. If there are any pets around you, be aware of where they are. Review your medicines with your doctor. Some medicines can make you feel dizzy. This can increase your chance of falling. Ask your doctor what other things that you can do to help prevent falls. This information is not intended to replace advice given to you by your health care provider. Make sure you discuss any questions you have with your health care provider. Document Released: 07/30/2009 Document Revised: 03/10/2016 Document Reviewed: 11/07/2014 Elsevier Interactive Patient Education  2017 [employer].

## 2023-05-22 NOTE — Progress Notes (Addendum)
Subjective:   Kari Lyons is a 72 y.o. female who presents for Medicare Annual (Subsequent) preventive examination.  Visit Complete: Virtual  I connected with  Kari Lyons on 05/22/23 by a audio enabled telemedicine application and verified that I am speaking with the correct person using two identifiers.  Patient Location: Home  Provider Location: Office/Clinic  I discussed the limitations of evaluation and management by telemedicine. The patient expressed understanding and agreed to proceed.  Vital Signs: Unable to obtain new vitals due to this being a telehealth visit.   Review of Systems     Cardiac Risk Factors include: advanced age (>34men, >42 women);diabetes mellitus;hypertension;obesity (BMI >30kg/m2)     Objective:    Today's Vitals   05/22/23 1427  Weight: 195 lb (88.5 kg)  Height: 5\' 5"  (1.651 m)   Body mass index is 32.45 kg/m.     05/22/2023    2:43 PM 11/21/2022    9:54 AM 05/02/2022    2:01 PM 04/26/2021    2:18 PM 04/09/2021    8:12 AM 04/23/2020    2:20 PM 04/16/2019   11:37 AM  Advanced Directives  Does Patient Have a Medical Advance Directive? Yes Yes Yes Yes Yes Yes Yes  Type of Estate agent of [place];Living will Healthcare Power of [place];Living will Healthcare Power of [place];Living will Healthcare Power of [place];Living will  Healthcare Power of [place];Living will Living will;Healthcare Power of Attorney  Does patient want to make changes to medical advance directive? No - Patient declined  No - Patient declined No - Patient declined  No - Patient declined No - Patient declined  Copy of Healthcare Power of Attorney in Chart? Yes - validated most recent copy scanned in chart (See row information) Yes - validated most recent copy scanned in chart (See row information) Yes - validated most recent copy scanned in chart (See row information) Yes - validated most recent copy scanned in chart (See row information)  Yes -  validated most recent copy scanned in chart (See row information) Yes - validated most recent copy scanned in chart (See row information)    Current Medications (verified) Outpatient Encounter Medications as of 05/22/2023  Medication Sig   Accu-Chek Softclix Lancets lancets USE 1 LANCET TO CHECK GLUCOSE ONCE DAILY   acetaminophen (TYLENOL) 500 MG tablet Take 1,000 mg by mouth every 6 (six) hours as needed for moderate pain or mild pain.   amLODipine (NORVASC) 2.5 MG tablet Take 1 tablet (2.5 mg total) by mouth daily.   Ascorbic Acid (VITAMIN C PO) Take 1,500 mg by mouth 2 (two) times daily. Buffered  C Complex 750 mg each   azelastine (ASTELIN) 0.1 % nasal spray USE 2 SPRAY(S) IN EACH NOSTRIL TWICE DAILY AS DIRECTED (Patient taking differently: Place 2 sprays into both nostrils at bedtime.)   B Complex-Biotin-FA (TH VITAMIN B 50/B-COMPLEX PO) Take 1 tablet by mouth daily.   blood glucose meter kit and supplies KIT Use once daily to check fasting and post prandial blood sugars   blood glucose meter kit and supplies Dispense based on patient and insurance preference. Use up to four times daily as directed. (FOR ICD-10 E10.9, E11.9).   Blood Glucose Monitoring Suppl (ACCU-CHEK AVIVA PLUS) w/Device KIT 1 Device by Does not apply route daily.   Cholecalciferol (VITAMIN D3) 10 MCG (400 UNIT) tablet Take 400 Units by mouth daily.   Chromium 200 MCG CAPS Take 200 mcg by mouth daily.   glucose blood (ACCU-CHEK  AVIVA PLUS) test strip USED AS DIRECTED TO TEST BLOOD SUGAR ONCE A DAY.  Dx code: E11.69   losartan (COZAAR) 100 MG tablet Take 1 tablet (100 mg total) by mouth daily.   Magnesium 250 MG TABS Take 250 mg by mouth daily.   OVER THE COUNTER MEDICATION Nettle Tea Chamomile Tea   Pantothenic Acid 500 MG TABS Take 500 mg by mouth daily.   Potassium 99 MG TABS Take by mouth.   triamcinolone cream (KENALOG) 0.1 % Apply 1 Application topically daily as needed (Eczema).   vitamin A 45409 UT capsule Take  10,000 Units by mouth daily.   vitamin E 180 MG (400 UNITS) capsule Take 400 Units by mouth daily.   Zinc 30 MG TABS Take 30 mg by mouth daily.   gabapentin (NEURONTIN) 100 MG capsule Take 1 capsule (100 mg total) by mouth 3 (three) times daily. (Patient not taking: Reported on 05/22/2023)   oxyCODONE (OXY IR/ROXICODONE) 5 MG immediate release tablet Take 1 tablet (5 mg total) by mouth every 6 (six) hours as needed for severe pain. (Patient not taking: Reported on 05/22/2023)   No facility-administered encounter medications on file as of 05/22/2023.    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 2009: Reported workup at [facility] including ECHO, Stress Test and Cardiac Cath; records N/A.  ECHO with mild LVH, mild MVP; stress test and cath reportedly normal    Depression    Diabetes mellitus without complication (HCC)    type 2   Edema    Fatty liver    Patient reports that MD in the past put this on her medical record   GERD (gastroesophageal reflux disease)    Hypertension    IBS (irritable bowel syndrome)    Mild tricuspid regurgitation    Mitral valve prolapse    Patient is Jehovah's Witness    Sleep apnea    MILD-NO CPAP    Past Surgical History:  Procedure Laterality Date   ABDOMINAL HYSTERECTOMY  1985   BILATERAL SALPINGOOPHORECTOMY  2002   due to fh of ovarian ca   BREAST BIOPSY Right 03/09/2016   papilloma   BREAST BIOPSY Left 10/03/2022   [place] Core Bx, Ribbon Clip- path pending   BREAST BIOPSY Left 10/03/2022   [place] Core Bx Coil clip - path pending   BREAST BIOPSY Left 10/03/2022   [place] LT BREAST BX W LOC DEV 1ST LESION IMG BX SPEC US GUIDE 10/03/2022 [facility]   BREAST BIOPSY Left 10/03/2022   [place] LT BREAST BX W LOC DEV EA ADD LESION IMG BX SPEC US GUIDE 10/03/2022  [facility]   BREAST BIOPSY Left 10/12/2022   Left Breast Stereo Bx X clip path pending   BREAST BIOPSY Left 10/12/2022   MM LT BREAST BX W LOC DEV 1ST LESION IMAGE BX SPEC STEREO GUIDE 10/12/2022 [facility]   BREAST BIOPSY  11/28/2022   MM LT RADIOACTIVE SEED LOC MAMMO GUIDE 11/28/2022 [facility]   BREAST LUMPECTOMY Right 06/08/2016   Procedure: BREAST LUMPECTOMY WITH ULTRASOUND IN O.R;  Surgeon: Kieth Brightly, MD;  Location: [facility];  Service: General;  Laterality: Right;   BREAST LUMPECTOMY WITH RADIOACTIVE SEED LOCALIZATION Left 11/29/2022   Procedure: LEFT BREAST LUMPECTOMY WITH RADIOACTIVE SEED LOCALIZATION;  Surgeon: Almond Lint, MD;  Location: [facility];  Service: General;  Laterality: Left;   BREAST SURGERY Left 02/26/2013   subareloar duct excision and mass   COLONOSCOPY  2012   COLONOSCOPY WITH PROPOFOL N/A 04/09/2021   Procedure: COLONOSCOPY WITH PROPOFOL;  Surgeon: Regis Bill, MD;  Location: [facility];  Service: Endoscopy;  Laterality: N/A;   ESOPHAGOGASTRODUODENOSCOPY (EGD) WITH PROPOFOL N/A 04/09/2021   Procedure: ESOPHAGOGASTRODUODENOSCOPY (EGD) WITH PROPOFOL;  Surgeon: Regis Bill, MD;  Location: [facility];  Service: Endoscopy;  Laterality: N/A;  DM   EXCISION / BIOPSY BREAST / NIPPLE / DUCT Right 1985   negative   EXCISION / BIOPSY BREAST / NIPPLE / DUCT Left 02/26/2013   intraductal papilloma   EXCISION / BIOPSY BREAST / NIPPLE / DUCT Right 06/09/2016   TONSILLECTOMY     Family History  Problem Relation Age of Onset   Hypertension Mother    Arthritis Mother    Parkinson's disease Father    Hypertension Sister    Cancer Sister 65       ovarian ca   Cancer Brother        multiple myeloma   Diabetes Brother    Heart disease Daughter    Bladder Cancer Paternal Uncle    Cancer Maternal Grandmother        colon ca   Breast cancer Paternal Grandmother 37   Prostate cancer Neg Hx    Kidney cancer Neg Hx    Social  History   Socioeconomic History   Marital status: Married    Spouse name: Not on file   Number of children: Not on file   Years of education: Not on file   Highest education level: Not on file  Occupational History   Not on file  Tobacco Use   Smoking status: Never   Smokeless tobacco: Never   Tobacco comments:    smoked for 3months when 72yrs old  Vaping Use   Vaping status: Never Used  Substance and Sexual Activity   Alcohol use: No    Alcohol/week: 0.0 standard drinks of alcohol   Drug use: No   Sexual activity: Not on file  Other Topics Concern   Not on file  Social History Narrative   Married   Social Determinants of Health   Financial Resource Strain: Low Risk  (05/22/2023)   Overall Financial Resource Strain (CARDIA)    Difficulty of Paying Living Expenses: Not hard at all  Food Insecurity: No Food Insecurity (05/22/2023)   Hunger Vital Sign    Worried About Running Out of Food in the Last Year: Never true    Ran Out of Food in the Last Year: Never true  Transportation Needs: No Transportation Needs (05/22/2023)   PRAPARE - Administrator, Civil Service (Medical): No    Lack of Transportation (Non-Medical): No  Physical Activity: Sufficiently Active (05/22/2023)   Exercise Vital Sign    Days of Exercise per Week: 5 days    Minutes of Exercise per Session: 30 min  Stress: No Stress Concern Present (05/22/2023)   [employer] of Occupational Health - Occupational Stress Questionnaire    Feeling of Stress : Only a little  Social Connections: Socially Integrated (05/22/2023)   Social Connection and Isolation Panel [NHANES]    Frequency of Communication with Friends and Family: More than three times a week    Frequency of Social Gatherings with Friends and Family: More than three times a week    Attends Religious Services: More than 4  times per year    Active Member of Clubs or Organizations: Yes    Attends Banker Meetings: More than 4 times  per year    Marital Status: Married    Tobacco Counseling Counseling given: Not Answered Tobacco comments: smoked for 3months when 72yrs old   Clinical Intake:  Pre-visit preparation completed: Yes  Pain : No/denies pain     BMI - recorded: 32.45 Nutritional Status: BMI > 30  Obese Nutritional Risks: None Diabetes: Yes CBG done?: No Did pt. bring in CBG monitor from home?: No  How often do you need to have someone help you when you read instructions, pamphlets, or other written materials from your doctor or pharmacy?: 1 - Never  Interpreter Needed?: No  Information entered by :: R. Kaianna Dolezal LPN   Activities of Daily Living    05/22/2023    2:30 PM 11/21/2022    9:56 AM  In your present state of health, do you have any difficulty performing the following activities:  Hearing? 1   Comment deaf in left ear   Vision? 0   Comment glasses   Difficulty concentrating or making decisions? 0   Walking or climbing stairs? 0   Dressing or bathing? 0   Doing errands, shopping? 0 0  Preparing Food and eating ? N   Using the Toilet? N   In the past six months, have you accidently leaked urine? N   Do you have problems with loss of bowel control? N   Managing your Medications? N   Managing your Finances? N   Housekeeping or managing your Housekeeping? N     Patient Care Team: Sherlene Shams, MD as PCP - General (Internal Medicine) Kieth Brightly, MD (General Surgery) Jaclyn Shaggy, MD (Internal Medicine)  Indicate any recent Medical Services you may have received from other than [facility] providers in the past year (date may be approximate).     Assessment:   This is a routine wellness examination for [place].  Hearing/Vision screen Hearing Screening - Comments:: Deaf in left ear Vision Screening - Comments:: glasses  Dietary issues and exercise activities discussed:     Goals Addressed             This Visit's Progress    Patient Stated       Wants to  work on a different diet       Depression Screen    05/22/2023    2:36 PM 02/20/2023   10:34 AM 08/22/2022   10:46 AM 05/02/2022    1:42 PM 08/16/2021   10:52 AM 04/26/2021    2:16 PM 02/12/2021   10:44 AM  PHQ 2/9 Scores  PHQ - 2 Score 2 0 6 0 0 0 0  PHQ- 9 Score 4 0 12    2    Fall Risk    05/22/2023    2:32 PM 02/20/2023   10:34 AM 08/22/2022   10:41 AM 05/02/2022    1:42 PM 02/14/2022   10:39 AM  Fall Risk   Falls in the past year? 0 0 0 0 0  Number falls in past yr: 0 0     Injury with Fall? 0 0     Risk for fall due to : No Fall Risks;Medication side effect No Fall Risks No Fall Risks  No Fall Risks  Follow up Falls prevention discussed;Falls evaluation completed Falls evaluation completed Falls evaluation completed Falls evaluation completed Falls evaluation completed    MEDICARE  RISK AT HOME:  Medicare Risk at Home - 05/22/23 1433     Any stairs in or around the home? Yes    If so, are there any without handrails? No    Home free of loose throw rugs in walkways, pet beds, electrical cords, etc? Yes    Adequate lighting in your home to reduce risk of falls? Yes    Life alert? No    Use of a cane, walker or w/c? No    Grab bars in the bathroom? No    Shower chair or bench in shower? Yes    Elevated toilet seat or a handicapped toilet? No              Cognitive Function:    04/13/2018    9:27 AM 06/12/2017    4:18 PM  MMSE - Mini Mental State Exam  Orientation to time 5 5  Orientation to Place 5 5  Registration 3 3  Attention/ Calculation 5 5  Recall 3 3  Language- name 2 objects 2 2  Language- repeat 1 1  Language- follow 3 step command 3 3  Language- read & follow direction 1 1  Write a sentence 1 1  Copy design 1 1  Total score 30 30        05/22/2023    2:43 PM 05/02/2022    2:01 PM 04/26/2021    2:29 PM 04/23/2020    2:40 PM 04/16/2019   11:41 AM  6CIT Screen  What Year? 0 points 0 points 0 points 0 points 0 points  What month? 0 points 0 points 0  points 0 points 0 points  What time? 0 points 0 points   0 points  Count back from 20 0 points 0 points 0 points  0 points  Months in reverse 0 points 0 points 0 points 0 points 0 points  Repeat phrase 0 points 0 points 0 points 0 points 0 points  Total Score 0 points 0 points   0 points    Immunizations Immunization History  Administered Date(s) Administered   Fluad Quad(high Dose 65+) 08/02/2019, 08/14/2020, 08/16/2021, 08/22/2022   Influenza,inj,Quad PF,6+ Mos 09/15/2017, 07/06/2018   Moderna Sars-Covid-2 Vaccination 12/31/2019, 01/28/2020, 08/24/2020, 02/26/2021   Pneumococcal Polysaccharide-23 08/14/2020    TDAP status: Due, Education has been provided regarding the importance of this vaccine. Advised may receive this vaccine at local pharmacy or Health Dept. Aware to provide a copy of the vaccination record if obtained from local pharmacy or Health Dept. Verbalized acceptance and understanding.Patient had a reaction tot he tetanus and has been told not to take another.one.  Flu Vaccine status: Up to date  Pneumococcal vaccine status: Up to date  Covid-19 vaccine status: Completed vaccines  Qualifies for Shingles Vaccine? Yes   Zostavax completed No   Shingrix Completed?: No.    Education has been provided regarding the importance of this vaccine. Patient has been advised to call insurance company to determine out of pocket expense if they have not yet received this vaccine. Advised may also receive vaccine at local pharmacy or Health Dept. Verbalized acceptance and understanding.  Screening Tests Health Maintenance  Topic Date Due   DTaP/Tdap/Td (1 - Tdap) Never done   COVID-19 Vaccine (5 - 2023-24 season) 06/17/2022   Medicare Annual Wellness (AWV)  05/03/2023   INFLUENZA VACCINE  05/18/2023   Zoster Vaccines- Shingrix (1 of 2) 05/23/2023 (Originally 05/11/1970)   Pneumonia Vaccine 34+ Years old (2 of 2 - PCV) 08/20/2023 (  Originally 08/14/2021)   HEMOGLOBIN A1C  08/15/2023    Diabetic kidney evaluation - Urine ACR  08/17/2023   OPHTHALMOLOGY EXAM  08/27/2023   MAMMOGRAM  09/13/2023   Diabetic kidney evaluation - eGFR measurement  02/13/2024   FOOT EXAM  02/20/2024   Colonoscopy  04/10/2031   DEXA SCAN  Completed   Hepatitis C Screening  Completed   HPV VACCINES  Aged Out    Health Maintenance  Health Maintenance Due  Topic Date Due   DTaP/Tdap/Td (1 - Tdap) Never done   COVID-19 Vaccine (5 - 2023-24 season) 06/17/2022   Medicare Annual Wellness (AWV)  05/03/2023   INFLUENZA VACCINE  05/18/2023    Colorectal cancer screening: Type of screening: Colonoscopy. Completed 6/22. Repeat every 10 years   Mammogram status: Completed 11/23. Repeat every year  Bone Density status: Completed 9/17. Results reflect: Bone density results: NORMAL. Repeat every 2 years.  Lung Cancer Screening: (Low Dose CT Chest recommended if Age 72-80 years, 20 pack-year currently smoking OR have quit w/in 15years.) does not qualify.    Additional Screening:  Hepatitis C Screening: does qualify; Completed 4/16  Vision Screening: Recommended annual ophthalmology exams for early detection of glaucoma and other disorders of the eye. Is the patient up to date with their annual eye exam?  Yes  Who is the provider or what is the name of the office in which the patient attends annual eye exams? [facility] If pt is not established with a provider, would they like to be referred to a provider to establish care? No .   Dental Screening: Recommended annual dental exams for proper oral hygiene  Diabetic Foot Exam: Diabetic Foot Exam: Completed 5/24  Community Resource Referral / Chronic Care Management: CRR required this visit?  No   CCM required this visit?  No     Plan:     I have personally reviewed and noted the following in the patient's chart:   Medical and social history Use of alcohol, tobacco or illicit drugs  Current medications and supplements including opioid  prescriptions. Patient is not currently taking opioid prescriptions. Functional ability and status Nutritional status Physical activity Advanced directives List of other physicians Hospitalizations, surgeries, and ER visits in previous 12 months Vitals Screenings to include cognitive, depression, and falls Referrals and appointments  In addition, I have reviewed and discussed with patient certain preventive protocols, quality metrics, and best practice recommendations. A written personalized care plan for preventive services as well as general preventive health recommendations were provided to patient.     Sydell Axon, LPN   10/22/1094   After Visit Summary: (MyChart) Due to this being a telephonic visit, the after visit summary with patients personalized plan was offered to patient via MyChart   Nurse Notes: None     I have reviewed the above information and agree with above.   Duncan Dull, MD

## 2023-05-23 ENCOUNTER — Encounter: Payer: Self-pay | Admitting: Internal Medicine

## 2023-05-23 ENCOUNTER — Ambulatory Visit: Payer: Medicare PPO | Admitting: Internal Medicine

## 2023-05-23 VITALS — BP 128/78 | HR 62 | Temp 97.7°F | Ht 65.0 in | Wt 195.4 lb

## 2023-05-23 DIAGNOSIS — E1159 Type 2 diabetes mellitus with other circulatory complications: Secondary | ICD-10-CM | POA: Diagnosis not present

## 2023-05-23 DIAGNOSIS — I10 Essential (primary) hypertension: Secondary | ICD-10-CM | POA: Diagnosis not present

## 2023-05-23 DIAGNOSIS — E785 Hyperlipidemia, unspecified: Secondary | ICD-10-CM | POA: Diagnosis not present

## 2023-05-23 DIAGNOSIS — D369 Benign neoplasm, unspecified site: Secondary | ICD-10-CM | POA: Diagnosis not present

## 2023-05-23 DIAGNOSIS — K581 Irritable bowel syndrome with constipation: Secondary | ICD-10-CM | POA: Diagnosis not present

## 2023-05-23 DIAGNOSIS — E1169 Type 2 diabetes mellitus with other specified complication: Secondary | ICD-10-CM | POA: Diagnosis not present

## 2023-05-23 DIAGNOSIS — I152 Hypertension secondary to endocrine disorders: Secondary | ICD-10-CM | POA: Diagnosis not present

## 2023-05-23 DIAGNOSIS — K6289 Other specified diseases of anus and rectum: Secondary | ICD-10-CM

## 2023-05-23 DIAGNOSIS — D1722 Benign lipomatous neoplasm of skin and subcutaneous tissue of left arm: Secondary | ICD-10-CM | POA: Diagnosis not present

## 2023-05-23 DIAGNOSIS — E669 Obesity, unspecified: Secondary | ICD-10-CM

## 2023-05-23 DIAGNOSIS — M130 Polyarthritis, unspecified: Secondary | ICD-10-CM | POA: Diagnosis not present

## 2023-05-23 DIAGNOSIS — N6321 Unspecified lump in the left breast, upper outer quadrant: Secondary | ICD-10-CM

## 2023-05-23 LAB — LIPID PANEL
Cholesterol: 121 mg/dL (ref 0–200)
HDL: 38.8 mg/dL — ABNORMAL LOW (ref >39.00)
LDL Cholesterol: 58 mg/dL (ref 0–99)
NonHDL: 82.57
Total CHOL/HDL Ratio: 3
Triglycerides: 122 mg/dL (ref 0.0–149.0)
VLDL: 24.4 mg/dL (ref 0.0–40.0)

## 2023-05-23 LAB — COMPREHENSIVE METABOLIC PANEL
ALT: 27 U/L (ref 0–35)
AST: 30 U/L (ref 0–37)
Albumin: 4.3 g/dL (ref 3.5–5.2)
Alkaline Phosphatase: 78 U/L (ref 39–117)
BUN: 8 mg/dL (ref 6–23)
CO2: 27 mEq/L (ref 19–32)
Calcium: 9.4 mg/dL (ref 8.4–10.5)
Chloride: 105 mEq/L (ref 96–112)
Creatinine, Ser: 0.89 mg/dL (ref 0.40–1.20)
GFR: 64.49 mL/min (ref >60.00)
Glucose, Bld: 88 mg/dL (ref 70–99)
Potassium: 4.3 mEq/L (ref 3.5–5.1)
Sodium: 138 mEq/L (ref 135–145)
Total Bilirubin: 0.6 mg/dL (ref 0.2–1.2)
Total Protein: 6.9 g/dL (ref 6.0–8.3)

## 2023-05-23 LAB — MICROALBUMIN / CREATININE URINE RATIO
Creatinine,U: 19.6 mg/dL
Microalb Creat Ratio: 3.6 mg/g (ref 0.0–30.0)
Microalb, Ur: <0.7 mg/dL (ref 0.0–1.9)

## 2023-05-23 LAB — LDL CHOLESTEROL, DIRECT: Direct LDL: 75 mg/dL

## 2023-05-23 LAB — HEMOGLOBIN A1C: Hgb A1c MFr Bld: 6.4 % (ref 4.6–6.5)

## 2023-05-23 MED ORDER — BLOOD GLUCOSE MONITORING SUPPL DEVI: 1.0000 | Freq: Three times a day (TID) | 0 refills | Status: DC

## 2023-05-23 MED ORDER — LANCETS MISC. MISC: 1.0000 | Freq: Three times a day (TID) | 0 refills | Status: DC

## 2023-05-23 MED ORDER — ACCU-CHEK AVIVA PLUS W/DEVICE KIT: 1.0000 | PACK | Freq: Every day | 0 refills | Status: AC

## 2023-05-23 MED ORDER — LANCET DEVICE MISC: 1.0000 | Freq: Three times a day (TID) | 0 refills | Status: DC

## 2023-05-23 NOTE — Assessment & Plan Note (Signed)
Home readings have been < 130/80 on amlodipine and losartan  no changes today

## 2023-05-23 NOTE — Patient Instructions (Addendum)
You can use colace  stool softener as needed for hard stools . Take at bedtime .  I am referring you back to [facility] for anuscopy to rule out anal fissure as  cause of your persistent  rectal pain   No surgery is needed on your shoulder lipoma unless it starts to bother you  Get your flu shot in [place] and let us know when you do  See you in 6 months .  Continue checking Blood pressure and notify me if home readings become > 130/85

## 2023-05-23 NOTE — Assessment & Plan Note (Signed)
Patient has had 3 prior breast surgeries  for "blocked milk ducts "  and presents with nipple leakage on the left of clear fluid , occasionally blood tinged .  Last surgery was over 5 years ago by Dr Evette Cristal.  Mammogram was normal Dec 2022, but given her new symptoms of right breast pain and left nipple discharge  she was referred to breast surgeron [place] and underwent excision

## 2023-05-23 NOTE — Assessment & Plan Note (Signed)
Excised by Almond Lint,  benign path

## 2023-05-23 NOTE — Assessment & Plan Note (Signed)
She has pain in multiple joints.  Screening for inflammatory disorder was negative with ESR and CRP. She finds that restricting her diet to exclude animal protein, dairy products and citrus helps.  Also avoids legumes  and soy.  .  She is using a  vegetable based protein shake   Lab Results  Component Value Date   ESRSEDRATE 15 02/06/2020   Lab Results  Component Value Date   CRP <1.0 02/06/2020

## 2023-05-23 NOTE — Assessment & Plan Note (Signed)
By MRI.  Reassurance provided

## 2023-05-23 NOTE — Assessment & Plan Note (Signed)
Stools have improved with pureeing of greens but she continues to have LLQ pain after BMs that lasts up to 30 minutes.  Advised  to consider adding stool softener

## 2023-05-23 NOTE — Progress Notes (Signed)
Subjective:  Patient ID: Kari Lyons, female    DOB: 1951-05-31  Age: 72 y.o. 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 Diagnoses of Obesity, diabetes, and hypertension syndrome (HCC), Hyperlipidemia associated with type 2 diabetes mellitus (HCC), Anal or rectal pain, Essential hypertension, Irritable bowel syndrome with constipation, Lipoma of left shoulder, Intraductal papilloma, and Mass of upper outer quadrant of left breast were also pertinent to this visit.   HPI Kari Lyons presents for  Chief Complaint  Patient presents with   Medical Management of Chronic Issues    diabetes   1) t2dm:  she feels generally well, is exercising several times per week and checking blood sugars once daily at variable times.  BS have been under 130 fasting and < 150 post prandially.  Denies any recent hypoglyemic events.  Taking his medications as directed. Following a carbohydrate modified diet 6 days per week. Denies numbness, burning and tingling of extremities. Appetite is good. Glucometer has not worked, however in several weeks     2) HTN;  along amlodipine and losartan. Home readings 128/78 to 130/85  3) Papilloma , recurrent:  saw [facility]er[facility],  patient had excisions,  negative biopsies    Follow up one year  4) lipoma left shoulder by MRI : surgery deferred   5) internal hemorrhoids; using anusol hc frequently  for rectal pain on average 3 times per week.  Denies constipation but has LLQ  pain after a BM that lasts up to a 30 minutes  ,  glycerin suppositories "made my rectum burn"  stopped using psyllium for management of IBS constipation for unclear reasons.  . Recommend GI evaluation to rule out fistula .  Reports one episode of BRBPR several months ago noted on tissue paper.   6) Arthritis flare:  joint pain improved when she restricted her diet to fruits and vegetables and lactose free protein shake,  brown rice.   Outpatient Medications Prior to Visit   Medication Sig Dispense Refill   Accu-Chek Softclix Lancets lancets USE 1 LANCET TO CHECK GLUCOSE ONCE DAILY 100 each 3   acetaminophen (TYLENOL) 500 MG tablet Take 1,000 mg by mouth every 6 (six) hours as needed for moderate pain or mild pain.     amLODipine (NORVASC) 2.5 MG tablet Take 1 tablet (2.5 mg total) by mouth daily. 90 tablet 1   Ascorbic Acid (VITAMIN C PO) Take 1,500 mg by mouth 2 (two) times daily. Buffered  C Complex 750 mg each     azelastine (ASTELIN) 0.1 % nasal spray USE 2 SPRAY(S) IN EACH NOSTRIL TWICE DAILY AS DIRECTED (Patient taking differently: Place 2 sprays into both nostrils at bedtime.) 30 mL 5   B Complex-Biotin-FA (TH VITAMIN B 50/B-COMPLEX PO) Take 1 tablet by mouth daily.     blood glucose meter kit and supplies KIT Use once daily to check fasting and post prandial blood sugars 1 each 0   blood glucose meter kit and supplies Dispense based on patient and insurance preference. Use up to four times daily as directed. (FOR ICD-10 E10.9, E11.9). 1 each 0   Cholecalciferol (VITAMIN D3) 10 MCG (400 UNIT) tablet Take 400 Units by mouth daily.     Chromium 200 MCG CAPS Take 200 mcg by mouth daily.     glucose blood (ACCU-CHEK AVIVA PLUS) test strip USED AS DIRECTED TO TEST BLOOD SUGAR ONCE A DAY.  Dx code: E11.69 100 strip 11   losartan (COZAAR) 100 MG tablet Take  1 tablet (100 mg total) by mouth daily. 90 tablet 1   Magnesium 250 MG TABS Take 250 mg by mouth daily.     OVER THE COUNTER MEDICATION Nettle Tea Chamomile Tea     Pantothenic Acid 500 MG TABS Take 500 mg by mouth daily.     Potassium 99 MG TABS Take by mouth.     triamcinolone cream (KENALOG) 0.1 % Apply 1 Application topically daily as needed (Eczema). 80 g 2   vitamin A 47425 UT capsule Take 10,000 Units by mouth daily.     vitamin E 180 MG (400 UNITS) capsule Take 400 Units by mouth daily.     Zinc 30 MG TABS Take 30 mg by mouth daily.     Blood Glucose Monitoring Suppl (ACCU-CHEK AVIVA PLUS) w/Device  KIT 1 Device by Does not apply route daily. 1 kit 0   gabapentin (NEURONTIN) 100 MG capsule Take 1 capsule (100 mg total) by mouth 3 (three) times daily. (Patient not taking: Reported on 05/23/2023) 90 capsule 3   oxyCODONE (OXY IR/ROXICODONE) 5 MG immediate release tablet Take 1 tablet (5 mg total) by mouth every 6 (six) hours as needed for severe pain. (Patient not taking: Reported on 05/23/2023) 5 tablet 0   No facility-administered medications prior to visit.    Review of Systems;  Patient denies headache, fevers, malaise, unintentional weight loss, skin rash, eye pain, sinus congestion and sinus pain, sore throat, dysphagia,  hemoptysis , cough, dyspnea, wheezing, chest pain, palpitations, orthopnea, edema, abdominal pain, nausea, melena, diarrhea, constipation, flank pain, dysuria, hematuria, urinary  Frequency, nocturia, numbness, tingling, seizures,  Focal weakness, Loss of consciousness,  Tremor, insomnia, depression, anxiety, and suicidal ideation.      Objective:  BP 128/78   Pulse 62   Temp 97.7 F (36.5 C) (Oral)   Ht 5\' 5"  (1.651 m)   Wt 195 lb 6.4 oz (88.6 kg)   SpO2 98%   BMI 32.52 kg/m   BP Readings from Last 3 Encounters:  05/23/23 128/78  02/20/23 (!) 146/68  11/29/22 (!) 158/74    Wt Readings from Last 3 Encounters:  05/23/23 195 lb 6.4 oz (88.6 kg)  05/22/23 195 lb (88.5 kg)  02/20/23 201 lb (91.2 kg)    Physical Exam Vitals reviewed.  Constitutional:      General: She is not in acute distress.    Appearance: Normal appearance. She is normal weight. She is not ill-appearing, toxic-appearing or diaphoretic.  HENT:     Head: Normocephalic.  Eyes:     General: No scleral icterus.       Right eye: No discharge.        Left eye: No discharge.     Conjunctiva/sclera: Conjunctivae normal.  Cardiovascular:     Rate and Rhythm: Normal rate and regular rhythm.     Heart sounds: Normal heart sounds.  Pulmonary:     Effort: Pulmonary effort is normal. No  respiratory distress.     Breath sounds: Normal breath sounds.  Musculoskeletal:        General: Normal range of motion.  Skin:    General: Skin is warm and dry.  Neurological:     General: No focal deficit present.     Mental Status: She is alert and oriented to person, place, and time. Mental status is at baseline.  Psychiatric:        Mood and Affect: Mood normal.        Behavior: Behavior normal.  Thought Content: Thought content normal.        Judgment: Judgment normal.     Lab Results  Component Value Date   HGBA1C 6.4 05/23/2023   HGBA1C 6.3 02/13/2023   HGBA1C 6.0 (H) 11/21/2022    Lab Results  Component Value Date   CREATININE 0.89 05/23/2023   CREATININE 0.87 02/13/2023   CREATININE 0.80 11/21/2022    Lab Results  Component Value Date   WBC 5.3 11/21/2022   HGB 12.4 11/21/2022   HCT 39.8 11/21/2022   PLT 233 11/21/2022   GLUCOSE 88 05/23/2023   CHOL 121 05/23/2023   TRIG 122.0 05/23/2023   HDL 38.80 (L) 05/23/2023   LDLDIRECT 75.0 05/23/2023   LDLCALC 58 05/23/2023   ALT 27 05/23/2023   AST 30 05/23/2023   NA 138 05/23/2023   K 4.3 05/23/2023   CL 105 05/23/2023   CREATININE 0.89 05/23/2023   BUN 8 05/23/2023   CO2 27 05/23/2023   TSH 2.12 08/16/2022   HGBA1C 6.4 05/23/2023   MICROALBUR <0.7 05/23/2023    MR SHOULDER LEFT W WO CONTRAST  Result Date: 03/14/2023 CLINICAL DATA:  Left shoulder pain EXAM: MRI OF THE LEFT SHOULDER WITHOUT AND WITH CONTRAST TECHNIQUE: Multiplanar, multisequence MR imaging of the left shoulder was performed before and after the administration of intravenous contrast. CONTRAST:  9mL GADAVIST GADOBUTROL 1 MMOL/ML IV SOLN COMPARISON:  None Available. FINDINGS: Rotator cuff: Mild distal supraspinatus and infraspinatus tendinosis. Teres minor tendon is intact. Subscapularis tendon is intact. Muscles: No significant muscle atrophy. Biceps Long Head: Intraarticular and extraarticular portions of the biceps tendon are intact.  Acromioclavicular Joint: Moderate arthropathy of the acromioclavicular joint periarticular marrow edema. Mild subacromial/subdeltoid bursal fluid . Glenohumeral Joint: No significant joint effusion.  Mild chondrosis. Labrum: Grossly intact, but evaluation is limited by lack of intraarticular fluid/contrast. Bones: No evidence of acute fracture.  No aggressive osseous lesion. Other: No focal fluid collection. Corresponding to the palpable area of concern, there is a subcutaneous fat containing mass measuring 5.3 x 2.1 x 4.5 cm (sagittal T1 image 15, coronal T1 postcontrast image 6). This mass is along the posterosuperior shoulder, abutting the scapular spine and distal trapezius muscle. There are thin internal septa without any suspicious enhancement. No suspicious areas of high T2 signal. IMPRESSION: Subcutaneous fat containing mass measuring 5.3 x 2.1 x 4.5 cm corresponding to the palpable area of concern along the posterior shoulder, imaging features consistent with a lipoma. No suspicious/atypical features. Recommend clinical follow-up. Mild distal supraspinatus and infraspinatus tendinosis. No high-grade or retracted cuff tear. No significant muscle atrophy. 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 Mild subacromial-subdeltoid bursitis. Mild glenohumeral osteoarthritis. Electronically Signed   By: Caprice Renshaw M.D.   On: 03/14/2023 11:26    Assessment & Plan:  .Polyarthritis Assessment & Plan: She has pain in multiple joints.  Screening for inflammatory disorder was negative with ESR and CRP. She finds that restricting her diet to exclude animal protein, dairy products and citrus helps.  Also avoids legumes  and soy.  .  She is using a  vegetable based protein shake   Lab Results  Component Value Date   ESRSEDRATE 15 02/06/2020   Lab Results  Component Value Date   CRP <1.0 02/06/2020     Obesity, diabetes, and hypertension syndrome (HCC) -     Hemoglobin A1c -      Comprehensive metabolic panel -     Microalbumin / creatinine urine ratio  Hyperlipidemia associated with type  2 diabetes mellitus (HCC) -     LDL cholesterol, direct -     Lipid panel  Anal or rectal pain Assessment & Plan: She had an internal hemorrhoid on exam last visit in June.  She has required use of anusol HC suppositories on  average 3 times weekly since then for rectal pain and has had one episode of BRBPR.  Referring back to Dr Mia Creek to rule out fissure.   Orders: -     Ambulatory referral to Gastroenterology  Essential hypertension Assessment & Plan: Home readings have been < 130/80 on amlodipine and losartan  no changes today    Irritable bowel syndrome with constipation Assessment & Plan: Stools have improved with pureeing of greens but she continues to have LLQ pain after BMs that lasts up to 30 minutes.  Advised  to consider adding stool softener    Lipoma of left shoulder Assessment & Plan: By MRI.  Reassurance provided    Intraductal papilloma Assessment & Plan: Patient has had 3 prior breast surgeries  for "blocked milk ducts "  and presents with nipple leakage on the left of clear fluid , occasionally blood tinged .  Last surgery was over 5 years ago by Dr Evette Cristal.  Mammogram was normal Dec 2022, but given her new symptoms of right breast pain and left nipple discharge  she was referred to breast surgeron [facility]erly and underwent excision    Mass of upper outer quadrant of left breast Assessment & Plan: Excised by Almond Lint,  benign path report reviewed today with patient    Other orders -     Lancet Device; 1 each by Does not apply route in the morning, at noon, and at bedtime. May substitute to any manufacturer covered by patient's insurance.  Dispense: 1 each; Refill: 0 -     Lancets Misc.; 1 each by Does not apply route in the morning, at noon, and at bedtime. May substitute to any manufacturer covered by patient's insurance.  Dispense: 100 each;  Refill: 0 -     Accu-Chek Aviva Plus; 1 Device by Does not apply route daily.  Dispense: 1 kit; Refill: 0 -     Blood Glucose Monitoring Suppl; 1 each by Does not apply route in the morning, at noon, and at bedtime. May substitute to any manufacturer covered by patient's insurance.  Dispense: 1 each; Refill: 0     I provided 30 minutes of face-to-face time during this encounter reviewing patient's last visit with me, patient's  most recent visit with surgery, recent surgical and non surgical procedures, previous  labs and imaging studies, counseling on currently addressed issues,  and post visit ordering to diagnostics and therapeutics .   Follow-up: No follow-ups on file.   Sherlene Shams, MD

## 2023-05-23 NOTE — Assessment & Plan Note (Signed)
She had an internal hemorrhoid on exam last visit in June.  She has required use of anusol HC suppositories on  average 3 times weekly since then for rectal pain and has had one episode of BRBPR.  Referring back to Dr Mia Creek to rule out fissure.

## 2023-05-29 ENCOUNTER — Telehealth: Payer: Self-pay | Admitting: Internal Medicine

## 2023-05-29 DIAGNOSIS — I152 Hypertension secondary to endocrine disorders: Secondary | ICD-10-CM

## 2023-05-29 MED ORDER — ACCU-CHEK SOFTCLIX LANCETS MISC: 3 refills | Status: DC

## 2023-05-29 MED ORDER — ACCU-CHEK AVIVA PLUS VI STRP: ORAL_STRIP | 11 refills | Status: DC

## 2023-05-29 NOTE — Telephone Encounter (Signed)
Refilled and pt is aware.  

## 2023-05-29 NOTE — Telephone Encounter (Signed)
Patient called and needs a refill on Lancet Device MISC. And also states that she needs more test scrip refills they have expired. The pharmacy is [id number] - [place], [place] - [address], [place] [address] Phone: [phone]  Fax: [phone]

## 2023-08-16 ENCOUNTER — Other Ambulatory Visit: Payer: Self-pay | Admitting: Internal Medicine

## 2023-08-16 DIAGNOSIS — K602 Anal fissure, unspecified: Secondary | ICD-10-CM | POA: Diagnosis not present

## 2023-08-16 DIAGNOSIS — K6289 Other specified diseases of anus and rectum: Secondary | ICD-10-CM | POA: Diagnosis not present

## 2023-08-16 DIAGNOSIS — K649 Unspecified hemorrhoids: Secondary | ICD-10-CM | POA: Diagnosis not present

## 2023-09-01 DIAGNOSIS — H40003 Preglaucoma, unspecified, bilateral: Secondary | ICD-10-CM | POA: Diagnosis not present

## 2023-09-01 DIAGNOSIS — H2513 Age-related nuclear cataract, bilateral: Secondary | ICD-10-CM | POA: Diagnosis not present

## 2023-09-01 DIAGNOSIS — E119 Type 2 diabetes mellitus without complications: Secondary | ICD-10-CM | POA: Diagnosis not present

## 2023-09-01 DIAGNOSIS — H5789 Other specified disorders of eye and adnexa: Secondary | ICD-10-CM | POA: Diagnosis not present

## 2023-09-01 DIAGNOSIS — Z0100 Encounter for examination of eyes and vision without abnormal findings: Secondary | ICD-10-CM | POA: Diagnosis not present

## 2023-09-01 LAB — HM DIABETES EYE EXAM

## 2023-11-14 ENCOUNTER — Other Ambulatory Visit: Payer: Self-pay | Admitting: Internal Medicine

## 2023-11-27 ENCOUNTER — Encounter: Payer: Self-pay | Admitting: Internal Medicine

## 2023-11-27 ENCOUNTER — Telehealth: Payer: Self-pay | Admitting: Internal Medicine

## 2023-11-27 ENCOUNTER — Other Ambulatory Visit: Payer: Self-pay | Admitting: Internal Medicine

## 2023-11-27 ENCOUNTER — Telehealth (INDEPENDENT_AMBULATORY_CARE_PROVIDER_SITE_OTHER): Payer: Medicare PPO | Admitting: Internal Medicine

## 2023-11-27 VITALS — BP 131/80 | HR 73 | Ht 65.0 in | Wt 205.0 lb

## 2023-11-27 DIAGNOSIS — E042 Nontoxic multinodular goiter: Secondary | ICD-10-CM

## 2023-11-27 DIAGNOSIS — E669 Obesity, unspecified: Secondary | ICD-10-CM | POA: Diagnosis not present

## 2023-11-27 DIAGNOSIS — I10 Essential (primary) hypertension: Secondary | ICD-10-CM

## 2023-11-27 DIAGNOSIS — E1159 Type 2 diabetes mellitus with other circulatory complications: Secondary | ICD-10-CM | POA: Diagnosis not present

## 2023-11-27 DIAGNOSIS — D1722 Benign lipomatous neoplasm of skin and subcutaneous tissue of left arm: Secondary | ICD-10-CM | POA: Diagnosis not present

## 2023-11-27 DIAGNOSIS — Z807 Family history of other malignant neoplasms of lymphoid, hematopoietic and related tissues: Secondary | ICD-10-CM

## 2023-11-27 DIAGNOSIS — M5416 Radiculopathy, lumbar region: Secondary | ICD-10-CM | POA: Insufficient documentation

## 2023-11-27 DIAGNOSIS — K6289 Other specified diseases of anus and rectum: Secondary | ICD-10-CM | POA: Diagnosis not present

## 2023-11-27 DIAGNOSIS — E1169 Type 2 diabetes mellitus with other specified complication: Secondary | ICD-10-CM | POA: Diagnosis not present

## 2023-11-27 DIAGNOSIS — N6320 Unspecified lump in the left breast, unspecified quadrant: Secondary | ICD-10-CM

## 2023-11-27 DIAGNOSIS — I152 Hypertension secondary to endocrine disorders: Secondary | ICD-10-CM

## 2023-11-27 MED ORDER — LOSARTAN POTASSIUM 100 MG PO TABS: 100.0000 mg | ORAL_TABLET | Freq: Every day | ORAL | 0 refills | Status: DC

## 2023-11-27 MED ORDER — HYDROCORTISONE (PERIANAL) 2.5 % EX CREA: 1.0000 | TOPICAL_CREAM | Freq: Two times a day (BID) | CUTANEOUS | 5 refills | Status: AC

## 2023-11-27 MED ORDER — AMLODIPINE BESYLATE 2.5 MG PO TABS: 2.5000 mg | ORAL_TABLET | Freq: Every day | ORAL | 1 refills | Status: DC

## 2023-11-27 MED ORDER — AZELASTINE HCL 0.1 % NA SOLN: NASAL | 5 refills | Status: AC

## 2023-11-27 NOTE — Assessment & Plan Note (Signed)
 Advised to continue current regimen of losartan  100 mg and amlodipine  2.5 mg for now,  increase amlodipine  to 5 mg for readings consistent greater than 135/85

## 2023-11-27 NOTE — Progress Notes (Signed)
 Virtual Visit via Caregility   Note   This format is felt to be most appropriate for this patient at this time.  All issues noted in this document were discussed and addressed.  No physical exam was performed (except for noted visual exam findings with Video Visits).   I connected with Kari Lyons on 11/27/23 at 11:30 AM EST by a video enabled telemedicine application  and verified that I am speaking with the correct person using two identifiers. Location patient: home Location provider: work or home office Persons participating in the virtual visit: patient, provider  I discussed the limitations, risks, security and privacy concerns of performing an evaluation and management service by telephone and the availability of in person appointments. I also discussed with the patient that there may be a patient responsible charge related to this service. The patient expressed understanding and agreed to proceed.  Reason for visit: follow up on type 3 DM,   HPI:  73 yr old female with type 2 DM, obesity, and hypertension here for follow up   1) DM type 2:  Diagnosed in August 2018 with an A1c of 6.5 , diet controlled since then.  Has gained weight  since last visit due to lack of exercise,  has developed persistent lumbar radiculitis and has stopped waking.   Not checking BS  last A1c was in August  .  Seeing orthopedist Colby Daub next week  in [place].  Discussed GLP 1 agonist therapy to manage obesity/diabetes but she has deferred due to GI issues.   2) HTN:  BP 's at home have been in the 130/80 range elevated on losartan  and low dose amlodipine  prior intolerance to hydrochlorothiazide  due to leg cramps.   3) lumbar radiculitis:  seeing Dr Colby Daub (Ortho? Neurosurgery?)  next Tuesday in [place].  No records available. Pain has been present for 4 months,  feels like legs may be getting weaker, had a fall in November.  She has not had an MRI of your spine . Had therapy from Dr Colby Daub for cervical spine which  improved her left arm pain.  4) Family history of Cancer: she has breast, ovarian and MM in family. Was advised by Dr Cherlynn Cornfield to consider genetic testing but the cost was not covered by her insurance.   ROS: See pertinent positives and negatives per HPI.  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 2009: Reported workup at [facility] including ECHO, Stress Test and Cardiac Cath; records N/A.  ECHO with mild LVH, mild MVP; stress test and cath reportedly normal    Depression    Diabetes mellitus without complication (HCC)    type 2   Edema    Fatty liver    Patient reports that MD in the past put this on her medical record   GERD (gastroesophageal reflux disease)    Hypertension    IBS (irritable bowel syndrome)    Mild tricuspid regurgitation    Mitral valve prolapse    Patient is Jehovah's Witness    Sleep apnea    MILD-NO CPAP     Past Surgical History:  Procedure Laterality Date   ABDOMINAL HYSTERECTOMY  1985   BILATERAL SALPINGOOPHORECTOMY  2002   due to fh of ovarian ca   BREAST BIOPSY Right 03/09/2016   papilloma   BREAST BIOPSY Left 10/03/2022   Us  Core Bx, Ribbon  Clip- path pending   BREAST BIOPSY Left 10/03/2022   Us  Core Bx Coil clip - path pending   BREAST BIOPSY Left 10/03/2022   US  LT BREAST BX W LOC DEV 1ST LESION IMG BX SPEC US  GUIDE 10/03/2022 [facility]   BREAST BIOPSY Left 10/03/2022   US  LT BREAST BX W LOC DEV EA ADD LESION IMG BX SPEC US  GUIDE 10/03/2022 [facility]   BREAST BIOPSY Left 10/12/2022   Left Breast Stereo Bx X clip path pending   BREAST BIOPSY Left 10/12/2022   MM LT BREAST BX W LOC DEV 1ST LESION IMAGE BX SPEC STEREO GUIDE 10/12/2022 [facility]   BREAST BIOPSY  11/28/2022   MM LT RADIOACTIVE SEED LOC MAMMO GUIDE 11/28/2022 [facility]   BREAST LUMPECTOMY Right 06/08/2016   Procedure: BREAST LUMPECTOMY WITH  ULTRASOUND IN O.R;  Surgeon: Jerlean Mood, MD;  Location: [facility];  Service: General;  Laterality: Right;   BREAST LUMPECTOMY WITH RADIOACTIVE SEED LOCALIZATION Left 11/29/2022   Procedure: LEFT BREAST LUMPECTOMY WITH RADIOACTIVE SEED LOCALIZATION;  Surgeon: Lockie Rima, MD;  Location: [facility];  Service: General;  Laterality: Left;   BREAST SURGERY Left 02/26/2013   subareloar duct excision and mass   COLONOSCOPY  2012   COLONOSCOPY WITH PROPOFOL  N/A 04/09/2021   Procedure: COLONOSCOPY WITH PROPOFOL ;  Surgeon: Shane Darling, MD;  Location: [facility];  Service: Endoscopy;  Laterality: N/A;   ESOPHAGOGASTRODUODENOSCOPY (EGD) WITH PROPOFOL  N/A 04/09/2021   Procedure: ESOPHAGOGASTRODUODENOSCOPY (EGD) WITH PROPOFOL ;  Surgeon: Shane Darling, MD;  Location: [facility];  Service: Endoscopy;  Laterality: N/A;  DM   EXCISION / BIOPSY BREAST / NIPPLE / DUCT Right 1985   negative   EXCISION / BIOPSY BREAST / NIPPLE / DUCT Left 02/26/2013   intraductal papilloma   EXCISION / BIOPSY BREAST / NIPPLE / DUCT Right 06/09/2016   TONSILLECTOMY      Family History  Problem Relation Age of Onset   Hypertension Mother    Arthritis Mother    Parkinson's disease Father    Hypertension Sister    Cancer Sister 78       ovarian ca   Cancer Brother        multiple myeloma   Diabetes Brother    Heart disease Daughter    Bladder Cancer Paternal Uncle    Cancer Maternal Grandmother        colon ca   Breast cancer Paternal Grandmother 37   Prostate cancer Neg Hx    Kidney cancer Neg Hx     SOCIAL HX:  reports that she has never smoked. She has never used smokeless tobacco. She reports that she does not drink alcohol and does not use drugs.    Current Outpatient Medications:    Accu-Chek Softclix Lancets lancets, USE 1 LANCET TO CHECK GLUCOSE ONCE DAILY DX CODE E11.69, Disp: 100 each, Rfl: 3   acetaminophen  (TYLENOL ) 500 MG tablet, Take 1,000 mg by mouth every 6 (six) hours as  needed for moderate pain or mild pain., Disp: , Rfl:    Ascorbic Acid (VITAMIN C PO), Take 1,500 mg by mouth 2 (two) times daily. Buffered  C Complex 750 mg each, Disp: , Rfl:    B Complex-Biotin-FA (TH VITAMIN B 50/B-COMPLEX PO), Take 1 tablet by mouth daily., Disp: , Rfl:    Blood Glucose Monitoring Suppl (ACCU-CHEK AVIVA PLUS) w/Device KIT, 1 Device by Does not apply route daily., Disp: 1 kit, Rfl: 0   Cholecalciferol (VITAMIN D3) 10 MCG (400 UNIT)  tablet, Take 400 Units by mouth daily., Disp: , Rfl:    glucose blood (ACCU-CHEK AVIVA PLUS) test strip, USED AS DIRECTED TO TEST BLOOD SUGAR ONCE A DAY.  Dx code: E11.69, Disp: 100 strip, Rfl: 11   hydrocortisone  (PROCTO-MED HC ) 2.5 % rectal cream, Place 1 Application rectally 2 (two) times daily., Disp: 30 g, Rfl: 5   hydrocortisone  2.5 % cream, Apply 1 Application topically 2 (two) times daily., Disp: , Rfl:    Magnesium 250 MG TABS, Take 250 mg by mouth daily., Disp: , Rfl:    OVER THE COUNTER MEDICATION, Nettle Tea Chamomile Tea, Disp: , Rfl:    Pantothenic Acid 500 MG TABS, Take 500 mg by mouth daily., Disp: , Rfl:    Potassium 99 MG TABS, Take by mouth., Disp: , Rfl:    triamcinolone  cream (KENALOG ) 0.1 %, Apply 1 Application topically daily as needed (Eczema)., Disp: 80 g, Rfl: 2   vitamin A 10000 UT capsule, Take 10,000 Units by mouth daily., Disp: , Rfl:    vitamin E 180 MG (400 UNITS) capsule, Take 400 Units by mouth daily., Disp: , Rfl:    Zinc 30 MG TABS, Take 30 mg by mouth daily., Disp: , Rfl:    amLODipine  (NORVASC ) 2.5 MG tablet, Take 1 tablet (2.5 mg total) by mouth daily., Disp: 90 tablet, Rfl: 1   azelastine  (ASTELIN ) 0.1 % nasal spray, USE 2 SPRAY(S) IN EACH NOSTRIL TWICE DAILY AS DIRECTED, Disp: 30 mL, Rfl: 5   losartan  (COZAAR ) 100 MG tablet, Take 1 tablet (100 mg total) by mouth daily., Disp: 90 tablet, Rfl: 0  EXAM:  VITALS per patient if applicable:  GENERAL: alert, oriented, appears well and in no acute  distress  HEENT: atraumatic, conjunttiva clear, no obvious abnormalities on inspection of external nose and ears  NECK: normal movements of the head and neck  LUNGS: on inspection no signs of respiratory distress, breathing rate appears normal, no obvious gross SOB, gasping or wheezing  CV: no obvious cyanosis  MS: moves all visible extremities without noticeable abnormality  PSYCH/NEURO: pleasant and cooperative, no obvious depression or anxiety, speech and thought processing grossly intact  ASSESSMENT AND PLAN: Obesity, diabetes, and hypertension syndrome (HCC) Assessment & Plan:  Her diabetes remains  well-controlled on diet alone .   Patient is up-to-date on eye exams and foot exam is normal today. Patient Diagnosed in August 2018 with an A1c of 6.5 , diet controlled since then.  She has no microalbuminuria. Patient has continued to defer statin therapy for CAD risk reduction despite her LDL> 70 and has declined GLP 1 agonist therapy due to concerns of adverse effects on GI system.  She is taking ARB for renal protection and hypertension  With normal readings,  Normal cr and lytes.   Lab Results  Component Value Date   HGBA1C 6.4 05/23/2023   Lab Results  Component Value Date   LABMICR See below: 10/30/2015   LABMICR See below: 10/13/2015   MICROALBUR <0.7 05/23/2023   MICROALBUR <0.7 08/16/2022    Lab Results  Component Value Date   CHOL 121 05/23/2023   HDL 38.80 (L) 05/23/2023   LDLCALC 58 05/23/2023   LDLDIRECT 75.0 05/23/2023   TRIG 122.0 05/23/2023   CHOLHDL 3 05/23/2023    Lab Results  Component Value Date   CREATININE 0.89 05/23/2023   Lab Results  Component Value Date   NA 138 05/23/2023   K 4.3 05/23/2023   CL 105 05/23/2023   CO2  27 05/23/2023         Anal or rectal pain Assessment & Plan: Seen by GI,  diagnosed with hemorrhoids and anal fissure in October   given Procto me 2.5 % hydrocortisone  cream.  Needs refill. Advised to retry glycerin  suppositories for easing passage of stools    Essential hypertension Assessment & Plan: Advised to continue current regimen of losartan  100 mg and amlodipine  2.5 mg for now,  increase amlodipine  to 5 mg for readings consistent greater than 135/85   Lipoma of left shoulder Assessment & Plan: Diagnosis suggested By MRI.  She has deferred surgery    Multinodular goiter (nontoxic) Assessment & Plan: Checking thyroid  function with next lab draw    Mass of left breast, unspecified quadrant Assessment & Plan: Excised by Lockie Rima,  benign path report    Lumbar radiculitis Assessment & Plan: She has been having bilateral lower lumbar spine pain since October/November and now has significant pain in the left buttock that radiates to below the shin.  She has been advised to increase tylenol  to 1000 mg bid and follow up with her spine surgeon as planned .  She has not had a lumbar MRI    Other orders -     amLODIPine  Besylate; Take 1 tablet (2.5 mg total) by mouth daily.  Dispense: 90 tablet; Refill: 1 -     Losartan  Potassium; Take 1 tablet (100 mg total) by mouth daily.  Dispense: 90 tablet; Refill: 0 -     Hydrocortisone  (Perianal); Place 1 Application rectally 2 (two) times daily.  Dispense: 30 g; Refill: 5 -     Azelastine  HCl; USE 2 SPRAY(S) IN EACH NOSTRIL TWICE DAILY AS DIRECTED  Dispense: 30 mL; Refill: 5      I discussed the assessment and treatment plan with the patient. The patient was provided an opportunity to ask questions and all were answered. The patient agreed with the plan and demonstrated an understanding of the instructions.   The patient was advised to call back or seek an in-person evaluation if the symptoms worsen or if the condition fails to improve as anticipated.   I spent 30 minutes dedicated to the care of this patient on the date of this encounter to include pre-visit review of her medical history,  Face-to-face time with the patient , and post visit  ordering of testing and therapeutics.    Thersia Flax, MD

## 2023-11-27 NOTE — Assessment & Plan Note (Signed)
 Excised by Lockie Rima,  benign path report

## 2023-11-27 NOTE — Assessment & Plan Note (Signed)
 She has been having bilateral lower lumbar spine pain since October/November and now has significant pain in the left buttock that radiates to below the shin.  She has been advised to increase tylenol  to 1000 mg bid and follow up with her spine surgeon as planned .  She has not had a lumbar MRI

## 2023-11-27 NOTE — Assessment & Plan Note (Signed)
 Checking thyroid  function with next lab draw

## 2023-11-27 NOTE — Telephone Encounter (Signed)
 Left message letting patient know her appointment for 11/27/23 will be virtual/provider working from home.

## 2023-11-27 NOTE — Assessment & Plan Note (Signed)
 Seen by GI,  diagnosed with hemorrhoids and anal fissure in October   given Procto me 2.5 % hydrocortisone  cream.  Needs refill. Advised to retry glycerin suppositories for easing passage of stools

## 2023-11-27 NOTE — Assessment & Plan Note (Signed)
 Diagnosis suggested By MRI.  She has deferred surgery

## 2023-11-27 NOTE — Assessment & Plan Note (Addendum)
 Her diabetes remains  well-controlled on diet alone .   Patient is up-to-date on eye exams and foot exam is normal today. Patient Diagnosed in August 2018 with an A1c of 6.5 , diet controlled since then.  She has no microalbuminuria. Patient has continued to defer statin therapy for CAD risk reduction despite her LDL> 70 and has declined GLP 1 agonist therapy due to concerns of adverse effects on GI system.  She is taking ARB for renal protection and hypertension  With normal readings,  Normal cr and lytes.   Lab Results  Component Value Date   HGBA1C 6.4 05/23/2023   Lab Results  Component Value Date   LABMICR See below: 10/30/2015   LABMICR See below: 10/13/2015   MICROALBUR <0.7 05/23/2023   MICROALBUR <0.7 08/16/2022    Lab Results  Component Value Date   CHOL 121 05/23/2023   HDL 38.80 (L) 05/23/2023   LDLCALC 58 05/23/2023   LDLDIRECT 75.0 05/23/2023   TRIG 122.0 05/23/2023   CHOLHDL 3 05/23/2023    Lab Results  Component Value Date   CREATININE 0.89 05/23/2023   Lab Results  Component Value Date   NA 138 05/23/2023   K 4.3 05/23/2023   CL 105 05/23/2023   CO2 27 05/23/2023

## 2023-11-27 NOTE — Patient Instructions (Addendum)
 Please check your BP once a day and  increase amlodipine  dose to 5 mg daily if your readings  are > 130/80  and continue losartan   at 100 mg daily   You can add up to 2000 mg of acetominophen (tylenol ) every day safely  In divided doses (500 mg every 6 hours  Or 1000 mg every 12 hours.)    Please return for labs ASAP for 6 month follow up on diabetes,  hyperlipidemia etc.

## 2023-12-05 DIAGNOSIS — M545 Low back pain, unspecified: Secondary | ICD-10-CM | POA: Diagnosis not present

## 2023-12-05 DIAGNOSIS — M5416 Radiculopathy, lumbar region: Secondary | ICD-10-CM | POA: Diagnosis not present

## 2023-12-05 DIAGNOSIS — G8929 Other chronic pain: Secondary | ICD-10-CM | POA: Diagnosis not present

## 2023-12-05 DIAGNOSIS — M549 Dorsalgia, unspecified: Secondary | ICD-10-CM | POA: Diagnosis not present

## 2023-12-05 DIAGNOSIS — M7918 Myalgia, other site: Secondary | ICD-10-CM | POA: Diagnosis not present

## 2023-12-05 DIAGNOSIS — M542 Cervicalgia: Secondary | ICD-10-CM | POA: Diagnosis not present

## 2023-12-11 DIAGNOSIS — M5416 Radiculopathy, lumbar region: Secondary | ICD-10-CM | POA: Diagnosis not present

## 2023-12-20 DIAGNOSIS — M7918 Myalgia, other site: Secondary | ICD-10-CM | POA: Diagnosis not present

## 2023-12-20 DIAGNOSIS — M5416 Radiculopathy, lumbar region: Secondary | ICD-10-CM | POA: Diagnosis not present

## 2023-12-20 DIAGNOSIS — G8929 Other chronic pain: Secondary | ICD-10-CM | POA: Diagnosis not present

## 2023-12-20 DIAGNOSIS — M549 Dorsalgia, unspecified: Secondary | ICD-10-CM | POA: Diagnosis not present

## 2023-12-20 DIAGNOSIS — M545 Low back pain, unspecified: Secondary | ICD-10-CM | POA: Diagnosis not present

## 2024-01-02 ENCOUNTER — Other Ambulatory Visit: Payer: Self-pay | Admitting: Internal Medicine

## 2024-01-02 ENCOUNTER — Telehealth: Payer: Self-pay

## 2024-01-02 ENCOUNTER — Encounter: Payer: Self-pay | Admitting: Internal Medicine

## 2024-01-02 MED ORDER — SCOPOLAMINE 1 MG/3DAYS TD PT72SCOPOLAMINE 1 MG/3DAYS
1.0000 | MEDICATED_PATCH | TRANSDERMAL | 0 refills | Status: DC
Start: 2024-01-02 — End: 2024-05-28

## 2024-01-02 NOTE — Progress Notes (Signed)
 Se[place]ick patch sent to [facility] sent about UaCR

## 2024-01-02 NOTE — Telephone Encounter (Signed)
 Copied from CRM [id number]. Topic: Clinical - Prescription Issue >> Jan 02, 2024  9:27 AM Dimitri Ped wrote: Reason for CRM: patient is calling about the patches that the doctor is suppose to prescribe for motion sickness . They still have not been sent in and patient says she leaves Friday and need those patches  [id number]

## 2024-04-02 DIAGNOSIS — D242 Benign neoplasm of left breast: Secondary | ICD-10-CM | POA: Diagnosis not present

## 2024-04-02 DIAGNOSIS — Z809 Family history of malignant neoplasm, unspecified: Secondary | ICD-10-CM | POA: Diagnosis not present

## 2024-04-03 ENCOUNTER — Other Ambulatory Visit: Payer: Self-pay | Admitting: General Surgery

## 2024-04-03 ENCOUNTER — Encounter: Payer: Self-pay | Admitting: General Surgery

## 2024-04-03 DIAGNOSIS — D242 Benign neoplasm of left breast: Secondary | ICD-10-CM

## 2024-04-03 DIAGNOSIS — Z1231 Encounter for screening mammogram for malignant neoplasm of breast: Secondary | ICD-10-CM

## 2024-04-29 ENCOUNTER — Other Ambulatory Visit: Payer: Self-pay | Admitting: General Surgery

## 2024-04-29 DIAGNOSIS — Z809 Family history of malignant neoplasm, unspecified: Secondary | ICD-10-CM

## 2024-04-29 DIAGNOSIS — D242 Benign neoplasm of left breast: Secondary | ICD-10-CM

## 2024-04-29 DIAGNOSIS — Z1231 Encounter for screening mammogram for malignant neoplasm of breast: Secondary | ICD-10-CM

## 2024-05-08 ENCOUNTER — Other Ambulatory Visit: Payer: Self-pay | Admitting: Internal Medicine

## 2024-05-14 ENCOUNTER — Ambulatory Visit
Admission: RE | Admit: 2024-05-14 | Discharge: 2024-05-14 | Disposition: A | Discharge status: Home / Self Care | Source: Ambulatory Visit | Attending: General Surgery | Admitting: General Surgery

## 2024-05-14 DIAGNOSIS — Z809 Family history of malignant neoplasm, unspecified: Secondary | ICD-10-CM | POA: Insufficient documentation

## 2024-05-14 DIAGNOSIS — Z1231 Encounter for screening mammogram for malignant neoplasm of breast: Secondary | ICD-10-CM | POA: Insufficient documentation

## 2024-05-14 DIAGNOSIS — Z86018 Personal history of other benign neoplasm: Secondary | ICD-10-CM | POA: Diagnosis not present

## 2024-05-14 DIAGNOSIS — D242 Benign neoplasm of left breast: Secondary | ICD-10-CM

## 2024-05-20 ENCOUNTER — Other Ambulatory Visit (INDEPENDENT_AMBULATORY_CARE_PROVIDER_SITE_OTHER)

## 2024-05-20 DIAGNOSIS — E669 Obesity, unspecified: Secondary | ICD-10-CM | POA: Diagnosis not present

## 2024-05-20 DIAGNOSIS — E1169 Type 2 diabetes mellitus with other specified complication: Secondary | ICD-10-CM | POA: Diagnosis not present

## 2024-05-20 DIAGNOSIS — E042 Nontoxic multinodular goiter: Secondary | ICD-10-CM

## 2024-05-20 DIAGNOSIS — I152 Hypertension secondary to endocrine disorders: Secondary | ICD-10-CM

## 2024-05-20 DIAGNOSIS — E1159 Type 2 diabetes mellitus with other circulatory complications: Secondary | ICD-10-CM | POA: Diagnosis not present

## 2024-05-20 DIAGNOSIS — Z807 Family history of other malignant neoplasms of lymphoid, hematopoietic and related tissues: Secondary | ICD-10-CM | POA: Diagnosis not present

## 2024-05-20 LAB — HEMOGLOBIN A1C: Hgb A1c MFr Bld: 6.7 % — ABNORMAL HIGH (ref 4.6–6.5)

## 2024-05-20 LAB — COMPREHENSIVE METABOLIC PANEL WITH GFR
ALT: 31 U/L (ref 0–35)
AST: 29 U/L (ref 0–37)
Albumin: 4.2 g/dL (ref 3.5–5.2)
Alkaline Phosphatase: 76 U/L (ref 39–117)
BUN: 17 mg/dL (ref 6–23)
CO2: 29 meq/L (ref 19–32)
Calcium: 9.4 mg/dL (ref 8.4–10.5)
Chloride: 102 meq/L (ref 96–112)
Creatinine, Ser: 0.83 mg/dL (ref 0.40–1.20)
GFR: 69.64 mL/min (ref >60.00)
Glucose, Bld: 111 mg/dL — ABNORMAL HIGH (ref 70–99)
Potassium: 4.4 meq/L (ref 3.5–5.1)
Sodium: 140 meq/L (ref 135–145)
Total Bilirubin: 0.5 mg/dL (ref 0.2–1.2)
Total Protein: 7.1 g/dL (ref 6.0–8.3)

## 2024-05-20 LAB — TSH: TSH: 2.83 u[IU]/mL (ref 0.35–5.50)

## 2024-05-20 LAB — CBC WITH DIFFERENTIAL/PLATELET
Basophils Absolute: 0 K/uL (ref 0.0–0.1)
Basophils Relative: 0.4 % (ref 0.0–3.0)
Eosinophils Absolute: 0.1 K/uL (ref 0.0–0.7)
Eosinophils Relative: 2.3 % (ref 0.0–5.0)
HCT: 38.5 % (ref 36.0–46.0)
Hemoglobin: 12.2 g/dL (ref 12.0–15.0)
Lymphocytes Relative: 45.3 % (ref 12.0–46.0)
Lymphs Abs: 2.1 K/uL (ref 0.7–4.0)
MCHC: 31.8 g/dL (ref 30.0–36.0)
MCV: 86.6 fl (ref 78.0–100.0)
Monocytes Absolute: 0.4 K/uL (ref 0.1–1.0)
Monocytes Relative: 8.8 % (ref 3.0–12.0)
Neutro Abs: 2 K/uL (ref 1.4–7.7)
Neutrophils Relative %: 43.2 % (ref 43.0–77.0)
Platelets: 225 K/uL (ref 150.0–400.0)
RBC: 4.44 Mil/uL (ref 3.87–5.11)
RDW: 14.3 % (ref 11.5–15.5)
WBC: 4.7 K/uL (ref 4.0–10.5)

## 2024-05-20 LAB — MICROALBUMIN / CREATININE URINE RATIO
Creatinine,U: 47 mg/dL
Microalb Creat Ratio: 16.6 mg/g (ref 0.0–30.0)
Microalb, Ur: 0.8 mg/dL (ref 0.0–1.9)

## 2024-05-21 LAB — LIPID PANEL W/REFLEX DIRECT LDL
Cholesterol: 180 mg/dL (ref <200)
HDL: 52 mg/dL (ref >=50)
LDL Cholesterol (Calc): 106 mg/dL — ABNORMAL HIGH
Non-HDL Cholesterol (Calc): 128 mg/dL (ref <130)
Total CHOL/HDL Ratio: 3.5 (calc) (ref <5.0)
Triglycerides: 123 mg/dL (ref <150)

## 2024-05-23 ENCOUNTER — Ambulatory Visit: Payer: Self-pay | Admitting: Internal Medicine

## 2024-05-27 ENCOUNTER — Ambulatory Visit (INDEPENDENT_AMBULATORY_CARE_PROVIDER_SITE_OTHER): Payer: Medicare PPO | Admitting: *Deleted

## 2024-05-27 ENCOUNTER — Ambulatory Visit: Admitting: Internal Medicine

## 2024-05-27 VITALS — Ht 65.0 in | Wt 200.0 lb

## 2024-05-27 DIAGNOSIS — Z0000 Encounter for general adult medical examination without abnormal findings: Secondary | ICD-10-CM

## 2024-05-27 NOTE — Patient Instructions (Signed)
 Kari Lyons , Thank you for taking time out of your busy schedule to complete your Annual Wellness Visit with me. I enjoyed our conversation and look forward to speaking with you again next year. I, as well as your care team,  appreciate your ongoing commitment to your health goals. Please review the following plan we discussed and let me know if I can assist you in the future. Your Game plan/ To Do List    Referrals: If you haven't heard from the office you've been referred to, please reach out to them at the phone provided.  Remember to update your pneumonia and shingles vaccines.  Make sure you get a flu and covid vaccines annually.   Follow up Visits: We will see or speak with you next year for your Next Medicare AWV with our clinical staff 05/1725 @ 1:00 Have you seen your provider in the last 6 months (3 months if uncontrolled diabetes)? Remember your upcoming appointment with your PCP  Clinician Recommendations:  Aim for 30 minutes of exercise or brisk walking, 6-8 glasses of water, and 5 servings of fruits and vegetables each day.       This is a list of the screenings recommended for you:  Health Maintenance  Topic Date Due   DTaP/Tdap/Td vaccine (1 - Tdap) Never done   Zoster (Shingles) Vaccine (1 of 2) Never done   Pneumococcal Vaccine for age over 85 (2 of 2 - PCV) 08/14/2021   COVID-19 Vaccine (6 - Moderna risk 2024-25 season) 01/22/2024   Complete foot exam   02/20/2024   Flu Shot  05/17/2024   Eye exam for diabetics  08/31/2024   Hemoglobin A1C  11/20/2024   Mammogram  05/14/2025   Yearly kidney function blood test for diabetes  05/20/2025   Yearly kidney health urinalysis for diabetes  05/20/2025   Medicare Annual Wellness Visit  05/27/2025   Colon Cancer Screening  04/10/2031   DEXA scan (bone density measurement)  Completed   Hepatitis C Screening  Completed   Hepatitis B Vaccine  Aged Out   HPV Vaccine  Aged Out   Meningitis B Vaccine  Aged Out    Advanced  directives: (In Chart) A copy of your advanced directives are scanned into your chart should your provider ever need it. Advance Care Planning is important because it:  [x]  Makes sure you receive the medical care that is consistent with your values, goals, and preferences  [x]  It provides guidance to your family and loved ones and reduces their decisional burden about whether or not they are making the right decisions based on your wishes.

## 2024-05-27 NOTE — Progress Notes (Signed)
 Subjective:   Kari Lyons is a 73 y.o. who presents for a Medicare Wellness preventive visit.  As a reminder, Annual Wellness Visits don't include a physical exam, and some assessments may be limited, especially if this visit is performed virtually. We may recommend an in-person follow-up visit with your provider if needed.  Visit Complete: Virtual I connected with  Kari Lyons on 05/27/24 by a audio enabled telemedicine application and verified that I am speaking with the correct person using two identifiers.  Patient Location: Home  Provider Location: Home Office  I discussed the limitations of evaluation and management by telemedicine. The patient expressed understanding and agreed to proceed.  Vital Signs: Because this visit was a virtual/telehealth visit, some criteria may be missing or patient reported. Any vitals not documented were not able to be obtained and vitals that have been documented are patient reported.  VideoDeclined- This patient declined Librarian, academic. Therefore the visit was completed with audio only.  Persons Participating in Visit: Patient.  AWV Questionnaire: No: Patient Medicare AWV questionnaire was not completed prior to this visit.  Cardiac Risk Factors include: advanced age (>81men, >64 women);diabetes mellitus;hypertension;obesity (BMI >30kg/m2)     Objective:    Today's Vitals   05/27/24 1300 05/27/24 1301  Weight: 200 lb (90.7 kg)   Height: 5' 5 (1.651 m)   PainSc:  4    Body mass index is 33.28 kg/m.     05/27/2024    1:23 PM 05/22/2023    2:43 PM 11/21/2022    9:54 AM 05/02/2022    2:01 PM 04/26/2021    2:18 PM 04/09/2021    8:12 AM 04/23/2020    2:20 PM  Advanced Directives  Does Patient Have a Medical Advance Directive? Yes Yes Yes Yes Yes Yes Yes  Type of Estate agent of [place];Living will Healthcare Power of [place];Living will Healthcare Power of [place];Living will  Healthcare Power of [place];Living will Healthcare Power of [place];Living will  Healthcare Power of [place];Living will  Does patient want to make changes to medical advance directive? No - Patient declined No - Patient declined  No - Patient declined No - Patient declined  No - Patient declined  Copy of Healthcare Power of Attorney in Chart? Yes - validated most recent copy scanned in chart (See row information) Yes - validated most recent copy scanned in chart (See row information) Yes - validated most recent copy scanned in chart (See row information) Yes - validated most recent copy scanned in chart (See row information) Yes - validated most recent copy scanned in chart (See row information)  Yes - validated most recent copy scanned in chart (See row information)    Current Medications (verified) Outpatient Encounter Medications as of 05/27/2024  Medication Sig   Accu-Chek Softclix Lancets lancets USE 1 LANCET TO CHECK GLUCOSE ONCE DAILY DX CODE E11.69   acetaminophen  (TYLENOL ) 500 MG tablet Take 1,000 mg by mouth every 6 (six) hours as needed for moderate pain or mild pain.   amLODipine  (NORVASC ) 2.5 MG tablet Take 1 tablet (2.5 mg total) by mouth daily.   Ascorbic Acid (VITAMIN C PO) Take 1,500 mg by mouth 2 (two) times daily. Buffered  C Complex 750 mg each   azelastine  (ASTELIN ) 0.1 % nasal spray USE 2 SPRAY(S) IN EACH NOSTRIL TWICE DAILY AS DIRECTED   B Complex-Biotin-FA (TH VITAMIN B 50/B-COMPLEX PO) Take 1 tablet by mouth daily.   Blood Glucose Monitoring Suppl (ACCU-CHEK AVIVA  PLUS) w/Device KIT 1 Device by Does not apply route daily.   Cholecalciferol (VITAMIN D3) 10 MCG (400 UNIT) tablet Take 400 Units by mouth daily.   glucose blood (ACCU-CHEK AVIVA PLUS) test strip USED AS DIRECTED TO TEST BLOOD SUGAR ONCE A DAY.  Dx code: E11.69   hydrocortisone  (PROCTO-MED HC ) 2.5 % rectal cream Place 1 Application rectally 2 (two) times daily.   hydrocortisone  2.5 % cream Apply 1 Application  topically 2 (two) times daily.   losartan  (COZAAR ) 100 MG tablet Take 1 tablet by mouth once daily   Magnesium 250 MG TABS Take 250 mg by mouth daily.   OVER THE COUNTER MEDICATION Nettle Tea Chamomile Tea   Potassium 99 MG TABS Take by mouth.   triamcinolone  cream (KENALOG ) 0.1 % Apply 1 Application topically daily as needed (Eczema).   vitamin E 180 MG (400 UNITS) capsule Take 400 Units by mouth daily.   Zinc 30 MG TABS Take 30 mg by mouth daily.   Pantothenic Acid 500 MG TABS Take 500 mg by mouth daily. (Patient not taking: Reported on 05/27/2024)   scopolamine  (TRANSDERM-SCOP) 1 MG/3DAYS Place 1 patch (1.5 mg total) onto the skin every 3 (three) days. (Patient not taking: Reported on 05/27/2024)   vitamin A 10000 UT capsule Take 10,000 Units by mouth daily. (Patient not taking: Reported on 05/27/2024)   No facility-administered encounter medications on file as of 05/27/2024.    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 2009: Reported workup at [facility] including ECHO, Stress Test and Cardiac Cath; records N/A.  ECHO with mild LVH, mild MVP; stress test and cath reportedly normal    Depression    Diabetes mellitus without complication (HCC)    type 2   Edema    Fatty liver    Patient reports that MD in the past put this on her medical record   GERD (gastroesophageal reflux disease)    Hypertension    IBS (irritable bowel syndrome)    Mild tricuspid regurgitation    Mitral valve prolapse    Patient is Jehovah's Witness    Sleep apnea    MILD-NO CPAP    Past Surgical History:  Procedure Laterality Date   ABDOMINAL HYSTERECTOMY  1985   BILATERAL SALPINGOOPHORECTOMY  2002   due to fh of ovarian ca   BREAST BIOPSY Right 03/09/2016   papilloma   BREAST BIOPSY Left 10/03/2022   Us  Core  Bx, Ribbon Clip- path pending   BREAST BIOPSY Left 10/03/2022   Us  Core Bx Coil clip - path pending   BREAST BIOPSY Left 10/03/2022   US  LT BREAST BX W LOC DEV 1ST LESION IMG BX SPEC US  GUIDE 10/03/2022 [facility]   BREAST BIOPSY Left 10/03/2022   US  LT BREAST BX W LOC DEV EA ADD LESION IMG BX SPEC US  GUIDE 10/03/2022 [facility]   BREAST BIOPSY Left 10/12/2022   Left Breast Stereo Bx X clip path pending   BREAST BIOPSY Left 10/12/2022   MM LT BREAST BX W LOC DEV 1ST LESION IMAGE BX SPEC STEREO GUIDE 10/12/2022 [facility]   BREAST BIOPSY  11/28/2022   MM LT RADIOACTIVE SEED LOC MAMMO GUIDE 11/28/2022 [facility]   BREAST LUMPECTOMY Right 06/08/2016   Procedure: BREAST LUMPECTOMY WITH ULTRASOUND IN O.R;  Surgeon:  Seeplaputhur KANDICE Muse, MD;  Location: [facility];  Service: General;  Laterality: Right;   BREAST LUMPECTOMY WITH RADIOACTIVE SEED LOCALIZATION Left 11/29/2022   Procedure: LEFT BREAST LUMPECTOMY WITH RADIOACTIVE SEED LOCALIZATION;  Surgeon: Aron Shoulders, MD;  Location: [facility];  Service: General;  Laterality: Left;   BREAST SURGERY Left 02/26/2013   subareloar duct excision and mass   COLONOSCOPY  2012   COLONOSCOPY WITH PROPOFOL  N/A 04/09/2021   Procedure: COLONOSCOPY WITH PROPOFOL ;  Surgeon: Maryruth Ole DASEN, MD;  Location: [facility];  Service: Endoscopy;  Laterality: N/A;   ESOPHAGOGASTRODUODENOSCOPY (EGD) WITH PROPOFOL  N/A 04/09/2021   Procedure: ESOPHAGOGASTRODUODENOSCOPY (EGD) WITH PROPOFOL ;  Surgeon: Maryruth Ole DASEN, MD;  Location: [facility];  Service: Endoscopy;  Laterality: N/A;  DM   EXCISION / BIOPSY BREAST / NIPPLE / DUCT Right 1985   negative   EXCISION / BIOPSY BREAST / NIPPLE / DUCT Left 02/26/2013   intraductal papilloma   EXCISION / BIOPSY BREAST / NIPPLE / DUCT Right 06/09/2016   TONSILLECTOMY     Family History  Problem Relation Age of Onset   Hypertension Mother    Arthritis Mother    Parkinson's disease Father     Hypertension Sister    Cancer Sister 12       ovarian ca   Cancer Brother        multiple myeloma   Diabetes Brother    Heart disease Daughter    Bladder Cancer Paternal Uncle    Cancer Maternal Grandmother        colon ca   Breast cancer Paternal Grandmother 27   Prostate cancer Neg Hx    Kidney cancer Neg Hx    Social History   Socioeconomic History   Marital status: Married    Spouse name: Not on file   Number of children: Not on file   Years of education: Not on file   Highest education level: Not on file  Occupational History   Not on file  Tobacco Use   Smoking status: Never   Smokeless tobacco: Never   Tobacco comments:    smoked for 3months when 73yrs old  Vaping Use   Vaping status: Never Used  Substance and Sexual Activity   Alcohol use: No    Alcohol/week: 0.0 standard drinks of alcohol   Drug use: No   Sexual activity: Not on file  Other Topics Concern   Not on file  Social History Narrative   Married   Social Drivers of Health   Financial Resource Strain: Low Risk  (05/27/2024)   Overall Financial Resource Strain (CARDIA)    Difficulty of Paying Living Expenses: Not hard at all  Food Insecurity: No Food Insecurity (05/27/2024)   Hunger Vital Sign    Worried About Running Out of Food in the Last Year: Never true    Ran Out of Food in the Last Year: Never true  Transportation Needs: No Transportation Needs (05/27/2024)   PRAPARE - Administrator, Civil Service (Medical): No    Lack of Transportation (Non-Medical): No  Physical Activity: Inactive (05/27/2024)   Exercise Vital Sign    Days of Exercise per Week: 0 days    Minutes of Exercise per Session: 0 min  Stress: No Stress Concern Present (05/27/2024)   [employer] of Occupational Health - Occupational Stress Questionnaire    Feeling of Stress: Not at all  Social Connections: Socially Integrated (05/27/2024)   Social Connection and Isolation Panel    Frequency of  Communication  with Friends and Family: More than three times a week    Frequency of Social Gatherings with Friends and Family: More than three times a week    Attends Religious Services: More than 4 times per year    Active Member of [employer] or Organizations: Yes    Attends Engineer, structural: More than 4 times per year    Marital Status: Married    Tobacco Counseling Counseling given: Not Answered Tobacco comments: smoked for 3months when 73yrs old    Clinical Intake:  Pre-visit preparation completed: Yes  Pain : 0-10 Pain Score: 4  Pain Type: Chronic pain Pain Location: Back (pinched nerves) Pain Descriptors / Indicators: Aching, Dull Pain Onset: More than a month ago Pain Frequency: Intermittent     BMI - recorded: 33.28 Nutritional Status: BMI > 30  Obese Nutritional Risks: None Diabetes: Yes CBG done?: Yes (FBS per patient 107 this am) CBG resulted in Enter/ Edit results?: No  Lab Results  Component Value Date   HGBA1C 6.7 (H) 05/20/2024   HGBA1C 6.4 05/23/2023   HGBA1C 6.3 02/13/2023     How often do you need to have someone help you when you read instructions, pamphlets, or other written materials from your doctor or pharmacy?: 1 - Never  Interpreter Needed?: No  Information entered by :: R. Jerilynn Feldmeier LPN   Activities of Daily Living     05/27/2024    1:06 PM  In your present state of health, do you have any difficulty performing the following activities:  Hearing? 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 0  Difficulty concentrating or making decisions? 0  Walking or climbing stairs? 1  Dressing or bathing? 0  Doing errands, shopping? 0  Preparing Food and eating ? N  Using the Toilet? N  In the past six months, have you accidently leaked urine? N  Do you have problems with loss of bowel control? N  Managing your Medications? N  Managing your Finances? N  Housekeeping or managing your Housekeeping? N    Patient Care Team: Marylynn Verneita CROME, MD as  PCP - General (Internal Medicine) Dellie Louanne MATSU, MD (General Surgery) Corlis Honor BROCKS, MD (Internal Medicine) Maryruth Ole DASEN, MD as Consulting Physician (Gastroenterology)  I have updated your Care Teams any recent Medical Services you may have received from other providers in the past year.     Assessment:   This is a routine wellness examination for Kari Lyons.  Hearing/Vision screen Hearing Screening - Comments:: Deaf in left ear Vision Screening - Comments:: glasses   Goals Addressed             This Visit's Progress    Patient Stated       Wants to continue PT so that she can start back walking       Depression Screen     05/27/2024    1:17 PM 11/27/2023   11:44 AM 05/23/2023   10:07 AM 05/22/2023    2:36 PM 02/20/2023   10:34 AM 08/22/2022   10:46 AM 05/02/2022    1:42 PM  PHQ 2/9 Scores  PHQ - 2 Score 0 0 0 2 0 6 0  PHQ- 9 Score 1  0 4 0 12     Fall Risk     05/27/2024    1:09 PM 11/27/2023   11:44 AM 05/23/2023   10:06 AM 05/22/2023    2:32 PM 02/20/2023   10:34 AM  Fall  Risk   Falls in the past year? 1 1 0 0 0  Number falls in past yr: 0 0 0 0 0  Injury with Fall? 0 0 0 0 0  Risk for fall due to : History of fall(s);Impaired balance/gait History of fall(s) No Fall Risks No Fall Risks;Medication side effect No Fall Risks  Follow up Falls evaluation completed;Falls prevention discussed Falls evaluation completed Falls evaluation completed Falls prevention discussed;Falls evaluation completed Falls evaluation completed    MEDICARE RISK AT HOME:  Medicare Risk at Home Any stairs in or around the home?: Yes If so, are there any without handrails?: No Home free of loose throw rugs in walkways, pet beds, electrical cords, etc?: No (discussed securing loose rugs) Adequate lighting in your home to reduce risk of falls?: Yes Life alert?: No Use of a cane, walker or w/c?: Yes Grab bars in the bathroom?: No Shower chair or bench in shower?: Yes Elevated toilet  seat or a handicapped toilet?: No  TIMED UP AND GO:  Was the test performed?    Cognitive Function: 6CIT completed    04/13/2018    9:27 AM 06/12/2017    4:18 PM  MMSE - Mini Mental State Exam  Orientation to time 5 5   Orientation to Place 5 5   Registration 3 3   Attention/ Calculation 5 5   Recall 3 3   Language- name 2 objects 2 2   Language- repeat 1 1  Language- follow 3 step command 3 3   Language- read & follow direction 1 1   Write a sentence 1 1   Copy design 1 1   Total score 30 30      Data saved with a previous flowsheet row definition        05/27/2024    1:23 PM 05/22/2023    2:43 PM 05/02/2022    2:01 PM 04/26/2021    2:29 PM 04/23/2020    2:40 PM  6CIT Screen  What Year? 0 points 0 points 0 points 0 points 0 points  What month? 0 points 0 points 0 points 0 points 0 points  What time? 0 points 0 points 0 points    Count back from 20 0 points 0 points 0 points 0 points   Months in reverse 0 points 0 points 0 points 0 points 0 points  Repeat phrase 0 points 0 points 0 points 0 points 0 points  Total Score 0 points 0 points 0 points      Immunizations Immunization History  Administered Date(s) Administered   Fluad Quad(high Dose 65+) 08/02/2019, 08/14/2020, 08/16/2021, 08/22/2022   Influenza,inj,Quad PF,6+ Mos 09/15/2017, 07/06/2018   Influenza-Unspecified 07/24/2023   Moderna Sars-Covid-2 Vaccination 12/31/2019, 01/28/2020, 08/24/2020, 02/26/2021   Pfizer(Comirnaty)Fall Seasonal Vaccine 12 years and older 07/24/2023   Pneumococcal Polysaccharide-23 08/14/2020    Screening Tests Health Maintenance  Topic Date Due   DTaP/Tdap/Td (1 - Tdap) Never done   Zoster Vaccines- Shingrix (1 of 2) Never done   Pneumococcal Vaccine: 50+ Years (2 of 2 - PCV) 08/14/2021   COVID-19 Vaccine (6 - Moderna risk 2024-25 season) 01/22/2024   FOOT EXAM  02/20/2024   Medicare Annual Wellness (AWV)  05/21/2024   INFLUENZA VACCINE  05/17/2024   OPHTHALMOLOGY EXAM   08/31/2024   HEMOGLOBIN A1C  11/20/2024   MAMMOGRAM  05/14/2025   Diabetic kidney evaluation - eGFR measurement  05/20/2025   Diabetic kidney evaluation - Urine ACR  05/20/2025   Colonoscopy  04/10/2031  DEXA SCAN  Completed   Hepatitis C Screening  Completed   Hepatitis B Vaccines  Aged Out   HPV VACCINES  Aged Out   Meningococcal B Vaccine  Aged Out    Health Maintenance  Health Maintenance Due  Topic Date Due   DTaP/Tdap/Td (1 - Tdap) Never done   Zoster Vaccines- Shingrix (1 of 2) Never done   Pneumococcal Vaccine: 50+ Years (2 of 2 - PCV) 08/14/2021   COVID-19 Vaccine (6 - Moderna risk 2024-25 season) 01/22/2024   FOOT EXAM  02/20/2024   Medicare Annual Wellness (AWV)  05/21/2024   INFLUENZA VACCINE  05/17/2024   Health Maintenance Items Addressed: Discussed the need to update pneumonia and shingles vaccines Reminded patient to get flu and covid vaccines annually.  Patient stated that she can not take the tetanus vaccine because she had an allergic reaction to it in the past. Patient stated that she wants to talk with PCP before Dexa is ordered.  Patient needs a diabetic foot exam completed and documented at next office visit.  Additional Screening:  Vision Screening: Recommended annual ophthalmology exams for early detection of glaucoma and other disorders of the eye. Up to date [facility] Would you like a referral to an eye doctor? No    Dental Screening: Recommended annual dental exams for proper oral hygiene  Community Resource Referral / Chronic Care Management: CRR required this visit?  No   CCM required this visit?  No   Plan:    I have personally reviewed and noted the following in the patient's chart:   Medical and social history Use of alcohol, tobacco or illicit drugs  Current medications and supplements including opioid prescriptions. Patient is not currently taking opioid prescriptions. Functional ability and status Nutritional status Physical  activity Advanced directives List of other physicians Hospitalizations, surgeries, and ER visits in previous 12 months Vitals Screenings to include cognitive, depression, and falls Referrals and appointments  In addition, I have reviewed and discussed with patient certain preventive protocols, quality metrics, and best practice recommendations. A written personalized care plan for preventive services as well as general preventive health recommendations were provided to patient.   Angeline Fredericks, LPN   [id number]   After Visit Summary: (MyChart) Due to this being a telephonic visit, the after visit summary with patients personalized plan was offered to patient via MyChart   Notes: Nothing significant to report at this time.

## 2024-05-28 ENCOUNTER — Telehealth: Payer: Self-pay

## 2024-05-28 ENCOUNTER — Ambulatory Visit: Admitting: Internal Medicine

## 2024-05-28 ENCOUNTER — Encounter: Payer: Self-pay | Admitting: Internal Medicine

## 2024-05-28 VITALS — BP 128/78 | HR 72 | Ht 65.0 in | Wt 203.8 lb

## 2024-05-28 DIAGNOSIS — M5416 Radiculopathy, lumbar region: Secondary | ICD-10-CM

## 2024-05-28 DIAGNOSIS — E785 Hyperlipidemia, unspecified: Secondary | ICD-10-CM

## 2024-05-28 DIAGNOSIS — E1169 Type 2 diabetes mellitus with other specified complication: Secondary | ICD-10-CM

## 2024-05-28 DIAGNOSIS — Z6833 Body mass index (BMI) 33.0-33.9, adult: Secondary | ICD-10-CM | POA: Diagnosis not present

## 2024-05-28 DIAGNOSIS — I152 Hypertension secondary to endocrine disorders: Secondary | ICD-10-CM | POA: Diagnosis not present

## 2024-05-28 DIAGNOSIS — T466X5A Adverse effect of antihyperlipidemic and antiarteriosclerotic drugs, initial encounter: Secondary | ICD-10-CM

## 2024-05-28 DIAGNOSIS — E669 Obesity, unspecified: Secondary | ICD-10-CM | POA: Diagnosis not present

## 2024-05-28 DIAGNOSIS — E1159 Type 2 diabetes mellitus with other circulatory complications: Secondary | ICD-10-CM | POA: Diagnosis not present

## 2024-05-28 MED ORDER — ACCU-CHEK SOFTCLIX LANCETS MISC: 3 refills | Status: AC

## 2024-05-28 MED ORDER — ACCU-CHEK AVIVA PLUS VI STRP: ORAL_STRIP | 11 refills | Status: AC

## 2024-05-28 MED ORDER — ROSUVASTATIN CALCIUM 10 MG PO TABS: 10.0000 mg | ORAL_TABLET | ORAL | 0 refills | Status: DC

## 2024-05-28 MED ORDER — AMLODIPINE BESYLATE 2.5 MG PO TABS: 2.5000 mg | ORAL_TABLET | Freq: Every day | ORAL | 1 refills | Status: AC

## 2024-05-28 NOTE — Telephone Encounter (Signed)
 Patient states at check-out that she would like to have a lab visit prior to her 66-month follow-up with Dr. Verneita Kettering.  I let patient know that I will send a message to Dr. Kettering to see if she has orders she would like to enter.  I did schedule patient for labs three weeks from today per Dr. Lula check-out note for today's visit.  I also scheduled patient's husband for a new patient visit with Dr. Verneita Kettering per Harlene Sheldon, CMA.

## 2024-05-28 NOTE — Progress Notes (Signed)
 Subjective:  Patient ID: Kari Lyons, female    DOB: [birth year]  Age: 73 y.o. MRN: [id number]  CC: The primary encounter diagnosis was Hyperlipidemia associated with type 2 diabetes mellitus (HCC). Diagnoses of Obesity, diabetes, and hypertension syndrome (HCC) and Lumbar radiculitis were also pertinent to this visit.   HPI Kari Lyons presents for  Chief Complaint  Patient presents with   Medical Management of Chronic Issues    6 month follow up    1) Type 2 DM follow up:  She  feels generally well, is not able to exercise due to 3 pinched nerves in lower spine .  She is  checking blood sugars once daily at variable times.  BS have been under 130 fasting and < 150 post prandially.  Denies any recent hypoglyemic events.  Taking his medications as directed. Following a carbohydrate modified diet 6 days per week. Denies numbness, burning and tingling of extremities. Appetite is good.     ) Hypertension: patient checks blood pressure twice weekly at home.  Readings have been for the most part <130/80 at rest . Patient is following a reduced salt diet most days and is taking losartan  100 mg and amlodipine  2.5 mg daily  as prescribed   3) Seeing a Sport and exercise psychologist who ordered an MRI lumber spine   and offered 1) nothing  2) PT and /or chiropractor    and 3 ) surgery .  She has deferred surgery and is doing PT at home .  The pain  radiates to both inner knees but is relieved temporarily with tylenol  and aggravated by walking > 15 minutes , even going to the grocery store    husband helps with housework       Outpatient Medications Prior to Visit  Medication Sig Dispense Refill   acetaminophen  (TYLENOL ) 500 MG tablet Take 1,000 mg by mouth every 6 (six) hours as needed for moderate pain or mild pain.     Ascorbic Acid (VITAMIN C PO) Take 1,500 mg by mouth 2 (two) times daily. Buffered  C Complex 750 mg each     azelastine  (ASTELIN ) 0.1 % nasal spray USE 2 SPRAY(S) IN EACH NOSTRIL  TWICE DAILY AS DIRECTED 30 mL 5   B Complex-Biotin-FA (TH VITAMIN B 50/B-COMPLEX PO) Take 1 tablet by mouth daily.     Blood Glucose Monitoring Suppl (ACCU-CHEK AVIVA PLUS) w/Device KIT 1 Device by Does not apply route daily. 1 kit 0   Cholecalciferol (VITAMIN D3) 10 MCG (400 UNIT) tablet Take 400 Units by mouth daily.     hydrocortisone  (PROCTO-MED HC ) 2.5 % rectal cream Place 1 Application rectally 2 (two) times daily. 30 g 5   hydrocortisone  2.5 % cream Apply 1 Application topically 2 (two) times daily.     losartan  (COZAAR ) 100 MG tablet Take 1 tablet by mouth once daily 90 tablet 1   Magnesium 250 MG TABS Take 250 mg by mouth daily.     OVER THE COUNTER MEDICATION Nettle Tea Chamomile Tea     Pantothenic Acid 500 MG TABS Take 500 mg by mouth daily.     Potassium 99 MG TABS Take by mouth.     triamcinolone  cream (KENALOG ) 0.1 % Apply 1 Application topically daily as needed (Eczema). 80 g 2   vitamin A 10000 UT capsule Take 10,000 Units by mouth daily.     vitamin E 180 MG (400 UNITS) capsule Take 400 Units by mouth daily.     Zinc 30  MG TABS Take 30 mg by mouth daily.     Accu-Chek Softclix Lancets lancets USE 1 LANCET TO CHECK GLUCOSE ONCE DAILY DX CODE E11.69 100 each 3   amLODipine  (NORVASC ) 2.5 MG tablet Take 1 tablet (2.5 mg total) by mouth daily. 90 tablet 1   glucose blood (ACCU-CHEK AVIVA PLUS) test strip USED AS DIRECTED TO TEST BLOOD SUGAR ONCE A DAY.  Dx code: E11.69 100 strip 11   scopolamine  (TRANSDERM-SCOP) 1 MG/3DAYS Place 1 patch (1.5 mg total) onto the skin every 3 (three) days. (Patient not taking: Reported on 05/27/2024) 4 patch 0   No facility-administered medications prior to visit.    Review of Systems;  Patient denies headache, fevers, malaise, unintentional weight loss, skin rash, eye pain, sinus congestion and sinus pain, sore throat, dysphagia,  hemoptysis , cough, dyspnea, wheezing, chest pain, palpitations, orthopnea, edema, abdominal pain, nausea, melena,  diarrhea, constipation, flank pain, dysuria, hematuria, urinary  Frequency, nocturia, numbness, tingling, seizures,  Focal weakness, Loss of consciousness,  Tremor, insomnia, depression, anxiety, and suicidal ideation.      Objective:  BP 128/78   Pulse 72   Ht 5' 5 (1.651 m)   Wt 203 lb 12.8 oz (92.4 kg)   SpO2 98%   BMI 33.91 kg/m   BP Readings from Last 3 Encounters:  05/28/24 128/78  11/27/23 131/80  05/23/23 128/78    Wt Readings from Last 3 Encounters:  05/28/24 203 lb 12.8 oz (92.4 kg)  05/27/24 200 lb (90.7 kg)  11/27/23 205 lb (93 kg)    Physical Exam Vitals reviewed.  Constitutional:      General: She is not in acute distress.    Appearance: Normal appearance. She is normal weight. She is not ill-appearing, toxic-appearing or diaphoretic.  HENT:     Head: Normocephalic.  Eyes:     General: No scleral icterus.       Right eye: No discharge.        Left eye: No discharge.     Conjunctiva/sclera: Conjunctivae normal.  Cardiovascular:     Rate and Rhythm: Normal rate and regular rhythm.     Heart sounds: Normal heart sounds.  Pulmonary:     Effort: Pulmonary effort is normal. No respiratory distress.     Breath sounds: Normal breath sounds.  Musculoskeletal:        General: Normal range of motion.  Skin:    General: Skin is warm and dry.  Neurological:     General: No focal deficit present.     Mental Status: She is alert and oriented to person, place, and time. Mental status is at baseline.  Psychiatric:        Mood and Affect: Mood normal.        Behavior: Behavior normal.        Thought Content: Thought content normal.        Judgment: Judgment normal.     Lab Results  Component Value Date   HGBA1C 6.7 (H) 05/20/2024   HGBA1C 6.4 05/23/2023   HGBA1C 6.3 02/13/2023    Lab Results  Component Value Date   CREATININE 0.83 05/20/2024   CREATININE 0.89 05/23/2023   CREATININE 0.87 02/13/2023    Lab Results  Component Value Date   WBC 4.7  05/20/2024   HGB 12.2 05/20/2024   HCT 38.5 05/20/2024   PLT 225.0 05/20/2024   GLUCOSE 111 (H) 05/20/2024   CHOL 180 05/20/2024   TRIG 123 05/20/2024   HDL 52 05/20/2024  LDLDIRECT 75.0 05/23/2023   LDLCALC 106 (H) 05/20/2024   ALT 31 05/20/2024   AST 29 05/20/2024   NA 140 05/20/2024   K 4.4 05/20/2024   CL 102 05/20/2024   CREATININE 0.83 05/20/2024   BUN 17 05/20/2024   CO2 29 05/20/2024   TSH 2.83 05/20/2024   HGBA1C 6.7 (H) 05/20/2024   MICROALBUR 0.8 05/20/2024    MM 3D SCREENING MAMMOGRAM BILATERAL BREAST Result Date: 05/16/2024 CLINICAL DATA:  Screening. EXAM: DIGITAL SCREENING BILATERAL MAMMOGRAM WITH TOMOSYNTHESIS AND CAD TECHNIQUE: Bilateral screening digital craniocaudal and mediolateral oblique mammograms were obtained. Bilateral screening digital breast tomosynthesis was performed. The images were evaluated with computer-aided detection. COMPARISON:  Previous exam(s). ACR Breast Density Category b: There are scattered areas of fibroglandular density. FINDINGS: There are no findings suspicious for malignancy. IMPRESSION: No mammographic evidence of malignancy. A result letter of this screening mammogram will be mailed directly to the patient. RECOMMENDATION: Screening mammogram in one year. (Code:SM-B-01Y) BI-RADS CATEGORY  1: Negative. Electronically Signed   By: Toribio Agreste M.D.   On: 05/16/2024 12:34    Assessment & Plan:  .Hyperlipidemia associated with type 2 diabetes mellitus (HCC) -     CK; Future  Obesity, diabetes, and hypertension syndrome (HCC) Assessment & Plan:  Her diabetes remains  well-controlled on diet alone .   Patient is up-to-date on eye exams and foot exam is normal today. Patient Diagnosed in August 2018 with an A1c of 6.5 , diet controlled since then.  She has no microalbuminuria. Patient has agreed to a trial of  defer statin therapy for CAD risk reduction with every other day rosuvastatin  .  She is taking ARB for renal protection and  hypertension  With normal readings,  Normal cr and lytes.   Lab Results  Component Value Date   HGBA1C 6.7 (H) 05/20/2024   Lab Results  Component Value Date   LABMICR See below: 10/30/2015   LABMICR See below: 10/13/2015   MICROALBUR 0.8 05/20/2024    Lab Results  Component Value Date   CHOL 180 05/20/2024   HDL 52 05/20/2024   LDLCALC 106 (H) 05/20/2024   LDLDIRECT 75.0 05/23/2023   TRIG 123 05/20/2024   CHOLHDL 3.5 05/20/2024    Lab Results  Component Value Date   CREATININE 0.83 05/20/2024   Lab Results  Component Value Date   NA 140 05/20/2024   K 4.4 05/20/2024   CL 102 05/20/2024   CO2 29 05/20/2024        Orders: -     Accu-Chek Softclix Lancets; USE 1 LANCET TO CHECK GLUCOSE ONCE DAILY DX CODE E11.69  Dispense: 100 each; Refill: 3 -     Comprehensive metabolic panel with GFR; Future  Lumbar radiculitis Assessment & Plan: She has been having bilateral lower lumbar spine pain since October/November and now has significant pain in the left buttock that radiates to below the shin. 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  Dispense: 90 tablet; Refill: 1 -     Accu-Chek Aviva Plus; USED AS DIRECTED TO TEST BLOOD SUGAR ONCE A DAY.  Dx code: E11.69  Dispense: 100 strip; Refill: 11 -     Rosuvastatin  Calcium ; Take 1 tablet (10 mg total) by mouth every other day.  Dispense: 15  tablet; Refill: 0    I personally spent a total of 33  minutes in the care of the patient today including preparing to see the patient, getting/reviewing separately obtained history, performing a medically appropriate exam/evaluation, counseling and educating, placing orders, and documenting clinical information in the EHR.  Follow-up: Return in about 6 months (around 11/28/2024).   Verneita LITTIE Kettering, MD

## 2024-05-28 NOTE — Patient Instructions (Addendum)
  Regarding your cholesterol,,   your level remains above the threshold for treatment  according to the [facility] and ADA guidelines.  Statin therapy is now considered standard of care for primary prevention of heart disease in all patients who have a diagnosis of type 2 diabetes.  I am recommending, therefore,  a trial of Crestor  (rosuvastatin ) at a low dose (10 mg every other day )) , and will send it to your pharmacy ,  iYou should return for a non fasting liver enzyme test after 3 weeks of therapy to monitor for any changes that may indicate  a need to stop the medication.    Exercise: (supported back extension, standing)  Stand against a counter or sofa with your buttocks resting on the edge and your hands on the edge as well on either side of your back  Slowly lean backwards,  Bending from the waist, until you feel slight discomfort. Restore yourself to vertical position (up straight) Repeat the back extension 10 times , each time extending a little farther).  What should you expect? The pain should recede from the calf/thigh/buttocks but may localize to the lower back  If it does not,  Or if it makes the leg pain worse, STOP doing it.   If it results in improvement,  Repeat the exercise every 2-3 hours while awake and STOP THE OTHER EXERCISES that do the opposite motion (back flexion)

## 2024-05-28 NOTE — Assessment & Plan Note (Signed)
 Her diabetes remains  well-controlled on diet alone .   Patient is up-to-date on eye exams and foot exam is normal today. Patient Diagnosed in August 2018 with an A1c of 6.5 , diet controlled since then.  She has no microalbuminuria. Patient has agreed to a trial of  defer statin therapy for CAD risk reduction with every other day rosuvastatin  .  She is taking ARB for renal protection and hypertension  With normal readings,  Normal cr and lytes.   Lab Results  Component Value Date   HGBA1C 6.7 (H) 05/20/2024   Lab Results  Component Value Date   LABMICR See below: 10/30/2015   LABMICR See below: 10/13/2015   MICROALBUR 0.8 05/20/2024    Lab Results  Component Value Date   CHOL 180 05/20/2024   HDL 52 05/20/2024   LDLCALC 106 (H) 05/20/2024   LDLDIRECT 75.0 05/23/2023   TRIG 123 05/20/2024   CHOLHDL 3.5 05/20/2024    Lab Results  Component Value Date   CREATININE 0.83 05/20/2024   Lab Results  Component Value Date   NA 140 05/20/2024   K 4.4 05/20/2024   CL 102 05/20/2024   CO2 29 05/20/2024

## 2024-05-28 NOTE — Assessment & Plan Note (Signed)
 She has been having bilateral lower lumbar spine pain since October/November and now has significant pain in the left buttock that radiates to below the shin. 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

## 2024-06-04 DIAGNOSIS — T466X5A Adverse effect of antihyperlipidemic and antiarteriosclerotic drugs, initial encounter: Secondary | ICD-10-CM | POA: Insufficient documentation

## 2024-06-04 NOTE — Telephone Encounter (Signed)
 Spoke with pt and she stated that after starting the Crestor  she started having knee, trouble walking, and constipation. Pt stated that she is not going to take the medication any longer.

## 2024-06-04 NOTE — Addendum Note (Signed)
 Addended by: MARYLYNN VERNEITA CROME on: 06/04/2024 03:59 PM   Modules accepted: Orders

## 2024-06-04 NOTE — Assessment & Plan Note (Addendum)
 TRIAL OF CRESTOR  prescribed for every other day use on August 12 CAUSED KNEE PAIN AND TROUBLE WALKING . Patient tried one or two doses (stopped as of August  18)

## 2024-06-18 ENCOUNTER — Other Ambulatory Visit

## 2024-07-10 NOTE — Progress Notes (Signed)
 Kari Lyons                                          MRN: [id number]   07/10/2024   The [facility] Quality Team Specialist reviewed this patient medical record for the purposes of chart review for care gap closure. The following were reviewed: abstraction for care gap closure-kidney health evaluation for diabetes:eGFR  and uACR.    [facility] Quality Team

## 2024-07-25 ENCOUNTER — Other Ambulatory Visit: Payer: Self-pay | Admitting: Internal Medicine

## 2024-09-02 LAB — OPHTHALMOLOGY REPORT-SCANNED

## 2024-11-04 ENCOUNTER — Other Ambulatory Visit: Payer: Self-pay | Admitting: Internal Medicine

## 2024-11-21 ENCOUNTER — Other Ambulatory Visit: Payer: Self-pay

## 2024-11-21 DIAGNOSIS — E119 Type 2 diabetes mellitus without complications: Secondary | ICD-10-CM

## 2024-11-21 DIAGNOSIS — E1169 Type 2 diabetes mellitus with other specified complication: Secondary | ICD-10-CM

## 2024-11-22 ENCOUNTER — Telehealth: Payer: Self-pay

## 2024-11-22 ENCOUNTER — Other Ambulatory Visit: Payer: Self-pay

## 2024-11-22 MED ORDER — BLOOD GLUCOSE TEST VI STRP: 1.0000 | ORAL_STRIP | 0 refills | Status: AC

## 2024-11-22 MED ORDER — LANCETS MISC: 1.0000 | 0 refills | Status: AC

## 2024-11-22 MED ORDER — LANCET DEVICE MISC: 1.0000 | 0 refills | Status: AC

## 2024-11-22 MED ORDER — BLOOD GLUCOSE MONITORING SUPPL DEVI: 1.0000 | 0 refills | Status: AC

## 2024-11-22 NOTE — Telephone Encounter (Signed)
 Copied from CRM [id number]. Topic: General - Other >> Nov 22, 2024 10:53 AM Berneda FALCON wrote: Reason for CRM: Anette from [facility] pharmacy wanted Harlene to know that the original script for test strips was approved, and sealed, ready for pickup with no patient charge.  [facility] - [place], [place] - [address] [place] [address] Phone: [phone] Fax: [phone] Hours: Not open 24 hours

## 2024-11-22 NOTE — Telephone Encounter (Signed)
 Pt is aware and gave a verbal understanding.

## 2024-11-25 ENCOUNTER — Other Ambulatory Visit

## 2024-12-02 ENCOUNTER — Ambulatory Visit: Admitting: Internal Medicine

## 2025-06-02 ENCOUNTER — Ambulatory Visit
